# Patient Record
Sex: Male | Born: 1960 | Race: White | Hispanic: No | Marital: Married | Smoking: Never smoker
Health system: Southern US, Community
[De-identification: ages and names within clinical notes are randomized; demographics above are authoritative.]

## PROBLEM LIST (undated history)

## (undated) DIAGNOSIS — G43909 Migraine, unspecified, not intractable, without status migrainosus: Secondary | ICD-10-CM

## (undated) DIAGNOSIS — I10 Essential (primary) hypertension: Secondary | ICD-10-CM

## (undated) DIAGNOSIS — IMO0002 Reserved for concepts with insufficient information to code with codable children: Secondary | ICD-10-CM

## (undated) DIAGNOSIS — T7840XA Allergy, unspecified, initial encounter: Secondary | ICD-10-CM

## (undated) DIAGNOSIS — D649 Anemia, unspecified: Secondary | ICD-10-CM

## (undated) DIAGNOSIS — R002 Palpitations: Secondary | ICD-10-CM

## (undated) DIAGNOSIS — R011 Cardiac murmur, unspecified: Secondary | ICD-10-CM

## (undated) DIAGNOSIS — E785 Hyperlipidemia, unspecified: Secondary | ICD-10-CM

## (undated) DIAGNOSIS — K648 Other hemorrhoids: Secondary | ICD-10-CM

## (undated) DIAGNOSIS — K219 Gastro-esophageal reflux disease without esophagitis: Secondary | ICD-10-CM

## (undated) DIAGNOSIS — M1990 Unspecified osteoarthritis, unspecified site: Secondary | ICD-10-CM

## (undated) DIAGNOSIS — R55 Syncope and collapse: Secondary | ICD-10-CM

## (undated) DIAGNOSIS — F32A Depression, unspecified: Secondary | ICD-10-CM

## (undated) DIAGNOSIS — F410 Panic disorder [episodic paroxysmal anxiety] without agoraphobia: Secondary | ICD-10-CM

## (undated) DIAGNOSIS — K5130 Ulcerative (chronic) rectosigmoiditis without complications: Secondary | ICD-10-CM

## (undated) HISTORY — DX: Essential (primary) hypertension: I10

## (undated) HISTORY — DX: Unspecified osteoarthritis, unspecified site: M19.90

## (undated) HISTORY — DX: Allergy, unspecified, initial encounter: T78.40XA

## (undated) HISTORY — DX: Panic disorder (episodic paroxysmal anxiety): F41.0

## (undated) HISTORY — PX: ADENOIDECTOMY: SUR15

## (undated) HISTORY — DX: Reserved for concepts with insufficient information to code with codable children: IMO0002

## (undated) HISTORY — PX: EYE SURGERY: SHX253

## (undated) HISTORY — DX: Gastro-esophageal reflux disease without esophagitis: K21.9

## (undated) HISTORY — PX: UPPER GASTROINTESTINAL ENDOSCOPY: SHX188

## (undated) HISTORY — DX: Anemia, unspecified: D64.9

## (undated) HISTORY — PX: OTHER SURGICAL HISTORY: SHX169

## (undated) HISTORY — DX: Migraine, unspecified, not intractable, without status migrainosus: G43.909

## (undated) HISTORY — DX: Palpitations: R00.2

## (undated) HISTORY — DX: Depression, unspecified: F32.A

## (undated) HISTORY — DX: Hyperlipidemia, unspecified: E78.5

## (undated) HISTORY — DX: Syncope and collapse: R55

## (undated) HISTORY — DX: Cardiac murmur, unspecified: R01.1

## (undated) HISTORY — DX: Ulcerative (chronic) rectosigmoiditis without complications: K51.30

## (undated) HISTORY — DX: Gilbert syndrome: E80.4

## (undated) HISTORY — DX: Other hemorrhoids: K64.8

## (undated) HISTORY — PX: COLONOSCOPY: SHX174

---

## 1999-10-11 DIAGNOSIS — K5130 Ulcerative (chronic) rectosigmoiditis without complications: Secondary | ICD-10-CM

## 1999-10-11 HISTORY — DX: Ulcerative (chronic) rectosigmoiditis without complications: K51.30

## 1999-11-10 ENCOUNTER — Ambulatory Visit (HOSPITAL_COMMUNITY): Admission: RE | Admit: 1999-11-10 | Discharge: 1999-11-10 | Payer: Self-pay | Admitting: Orthopedic Surgery

## 2000-03-16 ENCOUNTER — Ambulatory Visit (HOSPITAL_COMMUNITY): Admission: RE | Admit: 2000-03-16 | Discharge: 2000-03-16 | Payer: Self-pay | Admitting: Gastroenterology

## 2000-03-16 ENCOUNTER — Encounter (INDEPENDENT_AMBULATORY_CARE_PROVIDER_SITE_OTHER): Payer: Self-pay | Admitting: Specialist

## 2004-08-19 ENCOUNTER — Ambulatory Visit: Payer: Self-pay | Admitting: Internal Medicine

## 2004-08-23 ENCOUNTER — Encounter: Admission: RE | Admit: 2004-08-23 | Discharge: 2004-08-23 | Payer: Self-pay | Admitting: Internal Medicine

## 2004-08-25 ENCOUNTER — Encounter: Admission: RE | Admit: 2004-08-25 | Discharge: 2004-08-25 | Payer: Self-pay | Admitting: Internal Medicine

## 2004-08-31 ENCOUNTER — Encounter: Admission: RE | Admit: 2004-08-31 | Discharge: 2004-08-31 | Payer: Self-pay | Admitting: Internal Medicine

## 2004-09-14 ENCOUNTER — Ambulatory Visit: Payer: Self-pay | Admitting: Gastroenterology

## 2004-10-25 ENCOUNTER — Ambulatory Visit: Payer: Self-pay | Admitting: Internal Medicine

## 2004-12-22 ENCOUNTER — Ambulatory Visit: Payer: Self-pay | Admitting: Internal Medicine

## 2005-03-17 ENCOUNTER — Ambulatory Visit: Payer: Self-pay | Admitting: Internal Medicine

## 2005-04-11 ENCOUNTER — Ambulatory Visit: Payer: Self-pay | Admitting: Internal Medicine

## 2005-08-15 ENCOUNTER — Ambulatory Visit: Payer: Self-pay | Admitting: Internal Medicine

## 2005-08-16 ENCOUNTER — Ambulatory Visit: Payer: Self-pay | Admitting: Gastroenterology

## 2005-08-23 ENCOUNTER — Ambulatory Visit: Payer: Self-pay | Admitting: Internal Medicine

## 2005-10-12 ENCOUNTER — Encounter (INDEPENDENT_AMBULATORY_CARE_PROVIDER_SITE_OTHER): Payer: Self-pay | Admitting: Specialist

## 2005-10-12 ENCOUNTER — Ambulatory Visit: Payer: Self-pay | Admitting: Gastroenterology

## 2005-10-22 ENCOUNTER — Encounter: Payer: Self-pay | Admitting: Gastroenterology

## 2005-11-25 ENCOUNTER — Ambulatory Visit: Payer: Self-pay | Admitting: Internal Medicine

## 2006-03-20 ENCOUNTER — Ambulatory Visit: Payer: Self-pay | Admitting: Internal Medicine

## 2006-07-31 ENCOUNTER — Ambulatory Visit: Payer: Self-pay | Admitting: Internal Medicine

## 2006-07-31 LAB — CONVERTED CEMR LAB
Basophils Absolute: 0 10*3/uL (ref 0.0–0.1)
Basophils Relative: 0.2 % (ref 0.0–1.0)
Chol/HDL Ratio, serum: 4.3
Cholesterol: 191 mg/dL (ref 0–200)
Eosinophil percent: 3.1 % (ref 0.0–5.0)
HCT: 44.6 % (ref 39.0–52.0)
HDL: 44.8 mg/dL (ref >39.0)
Hemoglobin: 15.1 g/dL (ref 13.0–17.0)
LDL Cholesterol: 125 mg/dL — ABNORMAL HIGH (ref 0–99)
Lymphocytes Relative: 21.4 % (ref 12.0–46.0)
MCHC: 33.8 g/dL (ref 30.0–36.0)
MCV: 84.9 fL (ref 78.0–100.0)
Monocytes Absolute: 0.5 10*3/uL (ref 0.2–0.7)
Monocytes Relative: 8.5 % (ref 3.0–11.0)
Neutro Abs: 3.5 10*3/uL (ref 1.4–7.7)
Neutrophils Relative %: 66.8 % (ref 43.0–77.0)
Platelets: 202 10*3/uL (ref 150–400)
RBC: 5.26 M/uL (ref 4.22–5.81)
RDW: 12.3 % (ref 11.5–14.6)
Triglyceride fasting, serum: 107 mg/dL (ref 0–149)
VLDL: 21 mg/dL (ref 0–40)
WBC: 5.3 10*3/uL (ref 4.5–10.5)

## 2007-02-01 ENCOUNTER — Ambulatory Visit: Payer: Self-pay | Admitting: Internal Medicine

## 2007-02-01 LAB — CONVERTED CEMR LAB
ALT: 11 units/L (ref 0–40)
AST: 17 units/L (ref 0–37)
Albumin: 4.3 g/dL (ref 3.5–5.2)
Alkaline Phosphatase: 42 units/L (ref 39–117)
BUN: 13 mg/dL (ref 6–23)
Basophils Absolute: 0 10*3/uL (ref 0.0–0.1)
Basophils Relative: 0.5 % (ref 0.0–1.0)
Bilirubin, Direct: 0.1 mg/dL (ref 0.0–0.3)
CO2: 31 meq/L (ref 19–32)
Calcium: 9.4 mg/dL (ref 8.4–10.5)
Chloride: 106 meq/L (ref 96–112)
Cholesterol: 211 mg/dL (ref 0–200)
Creatinine, Ser: 1.2 mg/dL (ref 0.4–1.5)
Direct LDL: 145.8 mg/dL
Eosinophils Absolute: 0.1 10*3/uL (ref 0.0–0.6)
Eosinophils Relative: 1.9 % (ref 0.0–5.0)
GFR calc Af Amer: 85 mL/min
GFR calc non Af Amer: 70 mL/min
Glucose, Bld: 96 mg/dL (ref 70–99)
HCT: 41.5 % (ref 39.0–52.0)
HDL: 48.2 mg/dL (ref >39.0)
Hemoglobin: 14.7 g/dL (ref 13.0–17.0)
Lymphocytes Relative: 21.8 % (ref 12.0–46.0)
MCHC: 35.4 g/dL (ref 30.0–36.0)
MCV: 84.1 fL (ref 78.0–100.0)
Monocytes Absolute: 0.5 10*3/uL (ref 0.2–0.7)
Monocytes Relative: 9.2 % (ref 3.0–11.0)
Neutro Abs: 3.5 10*3/uL (ref 1.4–7.7)
Neutrophils Relative %: 66.6 % (ref 43.0–77.0)
Platelets: 176 10*3/uL (ref 150–400)
Potassium: 4.1 meq/L (ref 3.5–5.1)
RBC: 4.93 M/uL (ref 4.22–5.81)
RDW: 12.8 % (ref 11.5–14.6)
Sodium: 143 meq/L (ref 135–145)
TSH: 0.86 microintl units/mL (ref 0.35–5.50)
Total Bilirubin: 1.5 mg/dL — ABNORMAL HIGH (ref 0.3–1.2)
Total CHOL/HDL Ratio: 4.4
Total Protein: 6.8 g/dL (ref 6.0–8.3)
Triglycerides: 73 mg/dL (ref 0–149)
VLDL: 15 mg/dL (ref 0–40)
WBC: 5.3 10*3/uL (ref 4.5–10.5)

## 2007-02-12 ENCOUNTER — Ambulatory Visit: Payer: Self-pay | Admitting: Internal Medicine

## 2007-03-27 ENCOUNTER — Ambulatory Visit: Payer: Self-pay | Admitting: Internal Medicine

## 2007-04-04 ENCOUNTER — Ambulatory Visit: Payer: Self-pay | Admitting: Gastroenterology

## 2007-05-03 ENCOUNTER — Ambulatory Visit: Payer: Self-pay | Admitting: Gastroenterology

## 2007-05-03 DIAGNOSIS — J309 Allergic rhinitis, unspecified: Secondary | ICD-10-CM

## 2007-05-03 DIAGNOSIS — G43909 Migraine, unspecified, not intractable, without status migrainosus: Secondary | ICD-10-CM

## 2007-05-15 ENCOUNTER — Ambulatory Visit: Payer: Self-pay | Admitting: Internal Medicine

## 2007-05-15 DIAGNOSIS — E785 Hyperlipidemia, unspecified: Secondary | ICD-10-CM

## 2007-05-15 LAB — CONVERTED CEMR LAB
ALT: 9 units/L (ref 0–53)
AST: 13 units/L (ref 0–37)
Albumin: 4.4 g/dL (ref 3.5–5.2)
Alkaline Phosphatase: 29 units/L — ABNORMAL LOW (ref 39–117)
Bilirubin, Direct: 0.2 mg/dL (ref 0.0–0.3)
Cholesterol: 200 mg/dL (ref 0–200)
HDL: 54.4 mg/dL (ref >39.0)
LDL Cholesterol: 131 mg/dL — ABNORMAL HIGH (ref 0–99)
Total Bilirubin: 1.1 mg/dL (ref 0.3–1.2)
Total CHOL/HDL Ratio: 3.7
Total Protein: 6.7 g/dL (ref 6.0–8.3)
Triglycerides: 73 mg/dL (ref 0–149)
VLDL: 15 mg/dL (ref 0–40)

## 2007-05-22 ENCOUNTER — Ambulatory Visit: Payer: Self-pay | Admitting: Internal Medicine

## 2007-05-22 LAB — CONVERTED CEMR LAB
Basophils Absolute: 0 10*3/uL (ref 0.0–0.1)
Basophils Relative: 0.6 % (ref 0.0–1.0)
CEA: 1.3 ng/mL (ref 0.0–5.0)
CRP, High Sensitivity: <1 — ABNORMAL LOW (ref 0.00–5.00)
Eosinophils Absolute: 0 10*3/uL (ref 0.0–0.6)
Eosinophils Relative: 0.7 % (ref 0.0–5.0)
HCT: 40.2 % (ref 39.0–52.0)
Hemoglobin: 14.3 g/dL (ref 13.0–17.0)
Lymphocytes Relative: 32.1 % (ref 12.0–46.0)
MCHC: 35.6 g/dL (ref 30.0–36.0)
MCV: 84.9 fL (ref 78.0–100.0)
Monocytes Absolute: 0.4 10*3/uL (ref 0.2–0.7)
Monocytes Relative: 11.1 % — ABNORMAL HIGH (ref 3.0–11.0)
Neutro Abs: 2.2 10*3/uL (ref 1.4–7.7)
Neutrophils Relative %: 55.5 % (ref 43.0–77.0)
Platelets: 173 10*3/uL (ref 150–400)
RBC: 4.74 M/uL (ref 4.22–5.81)
RDW: 13.2 % (ref 11.5–14.6)
TSH: 1.18 microintl units/mL (ref 0.35–5.50)
WBC: 3.9 10*3/uL — ABNORMAL LOW (ref 4.5–10.5)

## 2007-05-23 ENCOUNTER — Ambulatory Visit: Payer: Self-pay | Admitting: Family Medicine

## 2007-05-23 ENCOUNTER — Encounter: Payer: Self-pay | Admitting: Internal Medicine

## 2007-05-25 ENCOUNTER — Telehealth: Payer: Self-pay | Admitting: Internal Medicine

## 2007-08-15 ENCOUNTER — Ambulatory Visit: Payer: Self-pay | Admitting: Internal Medicine

## 2007-08-15 LAB — CONVERTED CEMR LAB
Cholesterol: 199 mg/dL (ref 0–200)
HDL: 54.5 mg/dL (ref >39.0)
LDL Cholesterol: 126 mg/dL — ABNORMAL HIGH (ref 0–99)
Total CHOL/HDL Ratio: 3.7
Triglycerides: 91 mg/dL (ref 0–149)
VLDL: 18 mg/dL (ref 0–40)

## 2007-08-22 ENCOUNTER — Ambulatory Visit: Payer: Self-pay | Admitting: Internal Medicine

## 2007-08-22 LAB — CONVERTED CEMR LAB
Cholesterol, target level: 200 mg/dL
HDL goal, serum: 40 mg/dL
LDL Goal: 160 mg/dL

## 2007-11-28 ENCOUNTER — Ambulatory Visit: Payer: Self-pay | Admitting: Internal Medicine

## 2008-01-23 DIAGNOSIS — K648 Other hemorrhoids: Secondary | ICD-10-CM | POA: Insufficient documentation

## 2008-01-23 DIAGNOSIS — K5130 Ulcerative (chronic) rectosigmoiditis without complications: Secondary | ICD-10-CM | POA: Insufficient documentation

## 2008-01-31 ENCOUNTER — Ambulatory Visit: Payer: Self-pay | Admitting: Internal Medicine

## 2008-01-31 DIAGNOSIS — K219 Gastro-esophageal reflux disease without esophagitis: Secondary | ICD-10-CM

## 2008-04-02 ENCOUNTER — Ambulatory Visit: Payer: Self-pay | Admitting: Internal Medicine

## 2008-04-03 ENCOUNTER — Telehealth: Payer: Self-pay | Admitting: Internal Medicine

## 2008-04-03 LAB — CONVERTED CEMR LAB
Basophils Absolute: 0 10*3/uL (ref 0.0–0.1)
Basophils Relative: 0.4 % (ref 0.0–1.0)
CRP, High Sensitivity: <1 — ABNORMAL LOW (ref 0.00–5.00)
Eosinophils Absolute: 0 10*3/uL (ref 0.0–0.7)
Eosinophils Relative: 0.7 % (ref 0.0–5.0)
HCT: 41.5 % (ref 39.0–52.0)
Hemoglobin: 14.8 g/dL (ref 13.0–17.0)
Lymphocytes Relative: 30 % (ref 12.0–46.0)
MCHC: 35.6 g/dL (ref 30.0–36.0)
MCV: 85 fL (ref 78.0–100.0)
Monocytes Absolute: 0.5 10*3/uL (ref 0.1–1.0)
Monocytes Relative: 11.2 % (ref 3.0–12.0)
Neutro Abs: 2.5 10*3/uL (ref 1.4–7.7)
Neutrophils Relative %: 57.7 % (ref 43.0–77.0)
Platelets: 188 10*3/uL (ref 150–400)
RBC: 4.88 M/uL (ref 4.22–5.81)
RDW: 12.1 % (ref 11.5–14.6)
WBC: 4.3 10*3/uL — ABNORMAL LOW (ref 4.5–10.5)

## 2008-04-09 LAB — CONVERTED CEMR LAB: Vit D, 1,25-Dihydroxy: 23 — ABNORMAL LOW (ref 30–89)

## 2008-04-18 ENCOUNTER — Telehealth: Payer: Self-pay | Admitting: Gastroenterology

## 2008-05-06 ENCOUNTER — Ambulatory Visit: Payer: Self-pay | Admitting: Gastroenterology

## 2008-05-06 DIAGNOSIS — K5150 Left sided colitis without complications: Secondary | ICD-10-CM

## 2008-06-27 ENCOUNTER — Ambulatory Visit: Payer: Self-pay | Admitting: Gastroenterology

## 2008-08-13 ENCOUNTER — Ambulatory Visit: Payer: Self-pay | Admitting: Internal Medicine

## 2008-08-13 LAB — CONVERTED CEMR LAB
ALT: 11 units/L (ref 0–53)
AST: 15 units/L (ref 0–37)
Albumin: 4.5 g/dL (ref 3.5–5.2)
Alkaline Phosphatase: 35 units/L — ABNORMAL LOW (ref 39–117)
BUN: 12 mg/dL (ref 6–23)
Basophils Absolute: 0 10*3/uL (ref 0.0–0.1)
Basophils Relative: 0.8 % (ref 0.0–3.0)
Bilirubin, Direct: 0.1 mg/dL (ref 0.0–0.3)
CO2: 33 meq/L — ABNORMAL HIGH (ref 19–32)
Calcium: 9.5 mg/dL (ref 8.4–10.5)
Chloride: 106 meq/L (ref 96–112)
Cholesterol: 200 mg/dL (ref 0–200)
Creatinine, Ser: 1.2 mg/dL (ref 0.4–1.5)
Eosinophils Absolute: 0 10*3/uL (ref 0.0–0.7)
Eosinophils Relative: 1.2 % (ref 0.0–5.0)
GFR calc Af Amer: 84 mL/min
GFR calc non Af Amer: 70 mL/min
Glucose, Bld: 92 mg/dL (ref 70–99)
HCT: 43.3 % (ref 39.0–52.0)
HDL: 55 mg/dL (ref >39.0)
Hemoglobin: 15 g/dL (ref 13.0–17.0)
LDL Cholesterol: 132 mg/dL — ABNORMAL HIGH (ref 0–99)
Lymphocytes Relative: 28.6 % (ref 12.0–46.0)
MCHC: 34.7 g/dL (ref 30.0–36.0)
MCV: 88.8 fL (ref 78.0–100.0)
Monocytes Absolute: 0.4 10*3/uL (ref 0.1–1.0)
Monocytes Relative: 9.4 % (ref 3.0–12.0)
Neutro Abs: 2.4 10*3/uL (ref 1.4–7.7)
Neutrophils Relative %: 60 % (ref 43.0–77.0)
Platelets: 161 10*3/uL (ref 150–400)
Potassium: 4.6 meq/L (ref 3.5–5.1)
RBC: 4.88 M/uL (ref 4.22–5.81)
RDW: 12.5 % (ref 11.5–14.6)
Sodium: 144 meq/L (ref 135–145)
Total Bilirubin: 1.3 mg/dL — ABNORMAL HIGH (ref 0.3–1.2)
Total CHOL/HDL Ratio: 3.6
Total Protein: 7.1 g/dL (ref 6.0–8.3)
Triglycerides: 66 mg/dL (ref 0–149)
VLDL: 13 mg/dL (ref 0–40)
WBC: 3.9 10*3/uL — ABNORMAL LOW (ref 4.5–10.5)

## 2008-08-22 ENCOUNTER — Ambulatory Visit: Payer: Self-pay | Admitting: Internal Medicine

## 2008-08-27 ENCOUNTER — Telehealth: Payer: Self-pay | Admitting: *Deleted

## 2008-08-27 LAB — CONVERTED CEMR LAB: Vit D, 1,25-Dihydroxy: 17 — ABNORMAL LOW (ref 30–89)

## 2008-09-12 ENCOUNTER — Telehealth: Payer: Self-pay | Admitting: Gastroenterology

## 2008-09-12 ENCOUNTER — Telehealth: Payer: Self-pay | Admitting: Internal Medicine

## 2009-04-11 ENCOUNTER — Encounter: Payer: Self-pay | Admitting: Internal Medicine

## 2009-04-24 ENCOUNTER — Ambulatory Visit: Payer: Self-pay | Admitting: Internal Medicine

## 2009-04-24 LAB — CONVERTED CEMR LAB
ALT: 11 units/L (ref 0–53)
AST: 15 units/L (ref 0–37)
Albumin: 4.5 g/dL (ref 3.5–5.2)
Alkaline Phosphatase: 40 units/L (ref 39–117)
BUN: 15 mg/dL (ref 6–23)
Basophils Absolute: 0 10*3/uL (ref 0.0–0.1)
Basophils Relative: 0.8 % (ref 0.0–3.0)
Bilirubin, Direct: 0.2 mg/dL (ref 0.0–0.3)
CO2: 31 meq/L (ref 19–32)
Calcium: 9 mg/dL (ref 8.4–10.5)
Chloride: 107 meq/L (ref 96–112)
Cholesterol: 166 mg/dL (ref 0–200)
Creatinine, Ser: 1.2 mg/dL (ref 0.4–1.5)
Eosinophils Absolute: 0.1 10*3/uL (ref 0.0–0.7)
Eosinophils Relative: 1.5 % (ref 0.0–5.0)
GFR calc non Af Amer: 69.12 mL/min (ref >60)
Glucose, Bld: 96 mg/dL (ref 70–99)
HCT: 41.5 % (ref 39.0–52.0)
HDL: 53.5 mg/dL (ref >39.00)
Hemoglobin: 14.8 g/dL (ref 13.0–17.0)
LDL Cholesterol: 102 mg/dL — ABNORMAL HIGH (ref 0–99)
Lymphocytes Relative: 26.3 % (ref 12.0–46.0)
Lymphs Abs: 1 10*3/uL (ref 0.7–4.0)
MCHC: 35.6 g/dL (ref 30.0–36.0)
MCV: 84.3 fL (ref 78.0–100.0)
Monocytes Absolute: 0.4 10*3/uL (ref 0.1–1.0)
Monocytes Relative: 10.4 % (ref 3.0–12.0)
Neutro Abs: 2.4 10*3/uL (ref 1.4–7.7)
Neutrophils Relative %: 61 % (ref 43.0–77.0)
Platelets: 149 10*3/uL — ABNORMAL LOW (ref 150.0–400.0)
Potassium: 4.3 meq/L (ref 3.5–5.1)
RBC: 4.92 M/uL (ref 4.22–5.81)
RDW: 12.5 % (ref 11.5–14.6)
Sodium: 141 meq/L (ref 135–145)
TSH: 0.61 microintl units/mL (ref 0.35–5.50)
Total Bilirubin: 1.6 mg/dL — ABNORMAL HIGH (ref 0.3–1.2)
Total CHOL/HDL Ratio: 3
Total Protein: 7 g/dL (ref 6.0–8.3)
Triglycerides: 55 mg/dL (ref 0.0–149.0)
VLDL: 11 mg/dL (ref 0.0–40.0)
WBC: 3.9 10*3/uL — ABNORMAL LOW (ref 4.5–10.5)

## 2009-05-01 ENCOUNTER — Ambulatory Visit: Payer: Self-pay | Admitting: Internal Medicine

## 2009-05-07 ENCOUNTER — Encounter: Payer: Self-pay | Admitting: Internal Medicine

## 2009-05-07 ENCOUNTER — Ambulatory Visit: Payer: Self-pay

## 2009-05-18 LAB — CONVERTED CEMR LAB: IgE (Immunoglobulin E), Serum: 26.9 intl units/mL (ref 0.0–180.0)

## 2009-05-27 ENCOUNTER — Ambulatory Visit: Payer: Self-pay | Admitting: Internal Medicine

## 2009-05-29 ENCOUNTER — Ambulatory Visit: Payer: Self-pay | Admitting: Internal Medicine

## 2009-07-20 ENCOUNTER — Ambulatory Visit: Payer: Self-pay | Admitting: Internal Medicine

## 2009-07-20 ENCOUNTER — Encounter: Payer: Self-pay | Admitting: Internal Medicine

## 2009-07-31 ENCOUNTER — Ambulatory Visit: Payer: Self-pay | Admitting: Internal Medicine

## 2009-08-04 ENCOUNTER — Encounter: Payer: Self-pay | Admitting: Internal Medicine

## 2009-08-04 ENCOUNTER — Telehealth (INDEPENDENT_AMBULATORY_CARE_PROVIDER_SITE_OTHER): Payer: Self-pay | Admitting: *Deleted

## 2009-10-22 ENCOUNTER — Ambulatory Visit: Payer: Self-pay | Admitting: Internal Medicine

## 2009-10-22 LAB — CONVERTED CEMR LAB: Vit D, 25-Hydroxy: 36 ng/mL (ref 30–89)

## 2009-10-27 ENCOUNTER — Ambulatory Visit: Payer: Self-pay | Admitting: Internal Medicine

## 2009-10-27 DIAGNOSIS — E559 Vitamin D deficiency, unspecified: Secondary | ICD-10-CM

## 2010-01-21 ENCOUNTER — Telehealth: Payer: Self-pay | Admitting: Internal Medicine

## 2010-02-02 ENCOUNTER — Ambulatory Visit: Payer: Self-pay | Admitting: Internal Medicine

## 2010-02-02 DIAGNOSIS — M542 Cervicalgia: Secondary | ICD-10-CM

## 2010-02-23 ENCOUNTER — Encounter (INDEPENDENT_AMBULATORY_CARE_PROVIDER_SITE_OTHER): Payer: Self-pay | Admitting: *Deleted

## 2010-04-26 ENCOUNTER — Ambulatory Visit: Payer: Self-pay | Admitting: Internal Medicine

## 2010-04-26 LAB — CONVERTED CEMR LAB
ALT: 9 units/L (ref 0–53)
AST: 13 units/L (ref 0–37)
Albumin: 4.9 g/dL (ref 3.5–5.2)
Alkaline Phosphatase: 37 units/L — ABNORMAL LOW (ref 39–117)
BUN: 17 mg/dL (ref 6–23)
Basophils Absolute: 0 10*3/uL (ref 0.0–0.1)
Basophils Relative: 0.5 % (ref 0.0–3.0)
Bilirubin Urine: NEGATIVE
Bilirubin, Direct: 0.2 mg/dL (ref 0.0–0.3)
CO2: 33 meq/L — ABNORMAL HIGH (ref 19–32)
Calcium: 9.1 mg/dL (ref 8.4–10.5)
Chloride: 106 meq/L (ref 96–112)
Cholesterol: 213 mg/dL — ABNORMAL HIGH (ref 0–200)
Creatinine, Ser: 1.1 mg/dL (ref 0.4–1.5)
Direct LDL: 136.2 mg/dL
Eosinophils Absolute: 0.1 10*3/uL (ref 0.0–0.7)
Eosinophils Relative: 1.4 % (ref 0.0–5.0)
GFR calc non Af Amer: 73.77 mL/min (ref >60)
Glucose, Bld: 87 mg/dL (ref 70–99)
Glucose, Urine, Semiquant: NEGATIVE
HCT: 43.3 % (ref 39.0–52.0)
HDL: 64.9 mg/dL (ref >39.00)
Hemoglobin: 15.1 g/dL (ref 13.0–17.0)
Ketones, urine, test strip: NEGATIVE
Lymphocytes Relative: 30.7 % (ref 12.0–46.0)
Lymphs Abs: 1.4 10*3/uL (ref 0.7–4.0)
MCHC: 34.9 g/dL (ref 30.0–36.0)
MCV: 86.8 fL (ref 78.0–100.0)
Monocytes Absolute: 0.4 10*3/uL (ref 0.1–1.0)
Monocytes Relative: 9.4 % (ref 3.0–12.0)
Neutro Abs: 2.7 10*3/uL (ref 1.4–7.7)
Neutrophils Relative %: 58 % (ref 43.0–77.0)
Nitrite: NEGATIVE
Platelets: 174 10*3/uL (ref 150.0–400.0)
Potassium: 4 meq/L (ref 3.5–5.1)
Protein, U semiquant: NEGATIVE
RBC: 4.99 M/uL (ref 4.22–5.81)
RDW: 13 % (ref 11.5–14.6)
Sodium: 141 meq/L (ref 135–145)
Specific Gravity, Urine: 1.025
TSH: 0.73 microintl units/mL (ref 0.35–5.50)
Total Bilirubin: 1.5 mg/dL — ABNORMAL HIGH (ref 0.3–1.2)
Total CHOL/HDL Ratio: 3
Total Protein: 7.3 g/dL (ref 6.0–8.3)
Triglycerides: 93 mg/dL (ref 0.0–149.0)
Urobilinogen, UA: 0.2
VLDL: 18.6 mg/dL (ref 0.0–40.0)
WBC Urine, dipstick: NEGATIVE
WBC: 4.6 10*3/uL (ref 4.5–10.5)
pH: 6

## 2010-05-03 ENCOUNTER — Ambulatory Visit: Payer: Self-pay | Admitting: Internal Medicine

## 2010-05-03 DIAGNOSIS — L408 Other psoriasis: Secondary | ICD-10-CM

## 2010-06-16 ENCOUNTER — Ambulatory Visit: Payer: Self-pay | Admitting: Internal Medicine

## 2010-06-16 DIAGNOSIS — R071 Chest pain on breathing: Secondary | ICD-10-CM

## 2010-07-05 ENCOUNTER — Ambulatory Visit: Payer: Self-pay | Admitting: Internal Medicine

## 2010-10-12 ENCOUNTER — Encounter (INDEPENDENT_AMBULATORY_CARE_PROVIDER_SITE_OTHER): Payer: Self-pay | Admitting: *Deleted

## 2010-10-12 ENCOUNTER — Ambulatory Visit
Admission: RE | Admit: 2010-10-12 | Discharge: 2010-10-12 | Payer: Self-pay | Source: Home / Self Care | Attending: Internal Medicine | Admitting: Internal Medicine

## 2010-10-12 DIAGNOSIS — M1710 Unilateral primary osteoarthritis, unspecified knee: Secondary | ICD-10-CM | POA: Insufficient documentation

## 2010-11-11 ENCOUNTER — Encounter (INDEPENDENT_AMBULATORY_CARE_PROVIDER_SITE_OTHER): Payer: Self-pay | Admitting: *Deleted

## 2010-11-11 NOTE — Assessment & Plan Note (Signed)
Summary: requesting xray--pt fell//ccm   Vital Signs:  Patient profile:   50 year old male Weight:      168 pounds Temp:     98.5 degrees F oral BP sitting:   110 / 76  (right arm) Cuff size:   regular  Vitals Entered By: Cay Schillings LPN (June 16, 6788 9:02 AM) CC: fell from a coffee table monday - hit mid chest - no c/o SOB , c/o chest soreness Is Patient Diabetic? No   Primary Care Amaryllis Malmquist:  Benay Pillow, MD   CC:  fell from a coffee table monday - hit mid chest - no c/o SOB  and c/o chest soreness.  History of Present Illness: 50 year old patient who fell off a coffee table traumatizing his left anterolateral chest wall region.  There has been some local tenderness, but fairly modest as well as mild discomfort with deep inspiration.  There has been no shortness of breath. He has noted  No bruising.  He has been using anti-inflammatory medications with some benefit Flu Vaccine Consent Questions     Do you have a history of severe allergic reactions to this vaccine? no    Any prior history of allergic reactions to egg and/or gelatin? no    Do you have a sensitivity to the preservative Thimersol? no    Do you have a past history of Guillan-Barre Syndrome? no    Do you currently have an acute febrile illness? no    Have you ever had a severe reaction to latex? no    Vaccine information given and explained to patient? yes    Are you currently pregnant? no    Lot Number:[id number]   Exp Date:04/09/2011   Site Given  Left Deltoid IM  Allergies (verified): No Known Drug Allergies  Past History:  Past Medical History: Reviewed history from 05/06/2008 and no changes required. Allergic rhinitis Headache GERD Ulcerative proctosigmoiditis Internal hemorrhoids  Review of Systems       The patient complains of chest pain.  The patient denies anorexia, fever, weight loss, weight gain, vision loss, decreased hearing, hoarseness, syncope, dyspnea on exertion, peripheral  edema, prolonged cough, headaches, hemoptysis, abdominal pain, melena, hematochezia, severe indigestion/heartburn, hematuria, incontinence, genital sores, muscle weakness, suspicious skin lesions, transient blindness, difficulty walking, depression, unusual weight change, abnormal bleeding, enlarged lymph nodes, angioedema, breast masses, and testicular masses.    Physical Exam  General:  Well-developed,well-nourished,in no acute distress; alert,appropriate and cooperative throughout examination; blood pressure 120/80 Chest Wall:  mild tenderness over the right anterior chest wall region.  No ecchymosis Lungs:  Normal respiratory effort, chest expands symmetrically. Lungs are clear to auscultation, no crackles or wheezes. Heart:  no tachycardia   Impression & Recommendations:  Problem # 1:  CHEST WALL PAIN, ACUTE (ICD-786.52) will continue treatment with anti-inflammatory medications and observe  Complete Medication List: 1)  Colazal 750 Mg Caps (Balsalazide disodium) .... Take 3 tablets by mouth two times a day 2)  Flonase 50 Mcg/act Susp (Fluticasone propionate) .... Oen spray each nostril twice a day 3)  Imitrex 100 Mg Tabs (Sumatriptan succinate) .... Take as needed for migraines 4)  Doxepin Hcl 25 Mg Caps (Doxepin hcl) .... One by mouth  at bed time 5)  Loratadine 10 Mg Tabs (Loratadine) .... Take 1 tablet by mouth once a day 6)  Vitamin D 50000 Unit Caps (Ergocalciferol) .Marland Kitchen.. 1 every week 7)  Canasa 1000 Mg Supp (Mesalamine) .... One suppository every3rd  night 8)  Omega-3 Javier Docker  Oil 300 Mg Caps (Krill oil) .... One by mouth tid 9)  Diphenhydramine Hcl 25 Mg Tabs (Diphenhydramine hcl) .Marland Kitchen.. 1 at bedtime 10)  Endocet 5-325 Mg Tabs (Oxycodone-acetaminophen) .... One by mouth q 6 hours as needed head ache 11)  Clobetasol Propionate 0.05 % Foam (Clobetasol propionate) .... Apply to scalp daily as need 12)  Prilosec Otc 20 Mg Tbec (Omeprazole magnesium) .... Qd  Other Orders: Admin 1st  Vaccine (23361) Flu Vaccine 20yr + ((22449  Patient Instructions: 1)  Take 400-6064mof Ibuprofen (Advil, Motrin) with food every 4-6 hours as needed for relief of pain or comfort of fever. 2)  Please schedule a follow-up appointment as needed.

## 2010-11-11 NOTE — Assessment & Plan Note (Signed)
Summary: 3 month rov/njr [facility] BMP/NJR   Vital Signs:  Patient profile:   50 year old male Height:      70 inches Weight:      167 pounds BMI:     24.05 Temp:     98.3 degrees F oral Pulse rate:   76 / minute Resp:     14 per minute BP sitting:   110 / 80  (left arm)  Vitals Entered By: Allyne Gee, LPN (October 27, 6382 9:00 AM) CC: roa labs   Primary Care Provider:  Benay Pillow, MD   CC:  roa labs.  History of Present Illness: Low grade flair of colitis with some hemorrhoids flair the proctocort worked better that the  rowasa the vitamin d levels are stil low continue the vitamin D discussion the walking  Preventive Screening-Counseling & Management  Alcohol-Tobacco     Smoking Status: never  Problems Prior to Update: 1)  Allergic Rhinitis, Due To Food  (ICD-477.1) 2)  Hx of Acute Frontal Sinusitis  (ICD-461.1) 3)  Preventive Health Care  (ICD-V70.0) 4)  Ulcerative Colitis-left Side  (ICD-556.5) 5)  Gerd  (ICD-530.81) 6)  Ulcerative Colitis  (ICD-556.9) 7)  Internal Hemorrhoids  (ICD-455.0) 8)  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 Take 3 Tablets By Mouth Two Times A Day 2)  Flonase 50 Mcg/act Susp (Fluticasone Propionate) .... Oen Spray Each Nostril Twice A Day 3)  Imitrex 100 Mg Tabs (Sumatriptan Succinate) .... Take As Needed For Migraines 4)  Doxepin Hcl 50 Mg  Caps (Doxepin Hcl) .... Take 1 Tab By Mouth At Bedtime 5)  Loratadine 10 Mg  Tabs (Loratadine) .... Take 1 Tablet By Mouth Once A Day 6)  Darvocet-N 100 100-650 Mg  Tabs (Propoxyphene N-Apap) .Marland Kitchen.. 1 Every 4-6  Hours As Needed Headache 7)  Vitamin D 50000 Unit  Caps (Ergocalciferol) .Marland Kitchen.. 1 Every Week 8)  Canasa 1000 Mg  Supp (Mesalamine) .... One Suppository Every 3 Nights 9)  Krill Oil 1000 Mg Caps (Krill Oil) .Marland Kitchen.. 1 Once Daily 10)  Diphenhydramine Hcl 25 Mg Tabs (Diphenhydramine Hcl) .Marland Kitchen.. 1 At Bedtime  Current Medications (verified): 1)  Colazal 750 Mg Caps (Balsalazide Disodium) .... Take 3 Tablets By Mouth Two Times A Day 2)  Flonase 50 Mcg/act Susp (Fluticasone Propionate) .... Oen Spray Each Nostril Twice A Day 3)  Imitrex 100 Mg Tabs (Sumatriptan Succinate) .... Take As Needed For Migraines 4)  Doxepin Hcl 50 Mg  Caps (Doxepin Hcl) .... Take 1 Tab By Mouth At Bedtime 5)  Loratadine 10 Mg  Tabs (Loratadine) .... Take 1 Tablet By Mouth Once A Day 6)  Vitamin D 50000 Unit  Caps (Ergocalciferol) .Marland Kitchen.. 1 Every Week 7)  Canasa 1000 Mg  Supp (Mesalamine) .... One Suppository Every 3 Nights 8)  Krill 300 Mg .Marland Kitchen.. 1 Once Daily 9)  Diphenhydramine Hcl 25 Mg Tabs (Diphenhydramine Hcl) .Marland Kitchen.. 1 At Bedtime 10)  Hydrocortisone Acetate 30 Mg Supp (Hydrocortisone Acetate) .... Apply Per Rectum Daily As Directed 11)  Endocet 5-325 Mg Tabs (Oxycodone-Acetaminophen) .... One By Mouth Q 6 Hours As Needed Head Ache  Allergies (verified): No Known Drug  Allergies  Past History:  Family History: Last updated: 05/29/2009 father with hypothyroidism aortic acrch dilation Borther probable Marfan's disease with aortic root repair AF mother  Family History of Arthritis wth hip replacement. Family History of Colon Cancer: Grandmothers Family Family History of Colitis/Crohn's: Aunt (crohns), cousine (crohns) Family History of Colon Polyps: Grandmother, Great Aunts, Great Uncles Family History of Heart Disease: Brother, Father  Social History: Last updated: 05/06/2008 Domestic Partner Never Smoked Alcohol use-yes-1 glass nightly Occupation:  Probation officer Daily Caffeine Use Illicit Drug Use - no Patient  gets regular exercise.  Risk Factors: Exercise: yes (05/06/2008)  Risk Factors: Smoking Status: never (10/27/2009)  Past medical, surgical, family and social histories (including risk factors) reviewed, and no changes noted (except as noted below).  Past Medical History: Reviewed history from 05/06/2008 and no changes required. Allergic rhinitis Headache GERD Ulcerative proctosigmoiditis Internal hemorrhoids  Past Surgical History: Reviewed history from 05/06/2008 and no changes required. Eye Muscle transplant- Bilateral  Family History: Reviewed history from 05/29/2009 and no changes required. father with hypothyroidism aortic acrch dilation Borther probable Marfan's disease with aortic root repair AF mother  Family History of Arthritis wth hip replacement. Family History of Colon Cancer: Grandmothers Family Family History of Colitis/Crohn's: Aunt (crohns), cousine (crohns) Family History of Colon Polyps: Grandmother, Great Aunts, Great Uncles Family History of Heart Disease: Brother, Father  Social History: Reviewed history from 05/06/2008 and no changes required. Domestic Partner Never Smoked Alcohol use-yes-1 glass nightly Occupation:  Probation officer Daily Caffeine Use Illicit Drug Use - no Patient gets regular exercise.  Review of Systems  The patient denies anorexia, fever, weight loss, weight gain, vision loss, decreased hearing, hoarseness, chest pain, syncope, dyspnea on exertion, peripheral edema, prolonged cough, headaches, hemoptysis, abdominal pain, melena, hematochezia, severe indigestion/heartburn, hematuria, incontinence, genital sores, muscle weakness, suspicious skin lesions, transient blindness, difficulty walking, depression, unusual weight change, abnormal bleeding, enlarged lymph nodes, angioedema, and breast masses.    Physical Exam  General:  Well-developed,well-nourished,in no acute distress; alert,appropriate and cooperative throughout  examination Head:  Normocephalic and atraumatic. Ears:  R ear normal and L ear normal.   Nose:  no external deformity and no nasal discharge.   Neck:  No deformities, masses, or tenderness noted. Lungs:  Clear throughout to auscultation. Heart:  normal rate, regular rhythm, and Grade   1/6 systolic ejection murmur.   Abdomen:  Soft, nontender and nondistended. No masses, hepatosplenomegaly or hernias noted. Normal bowel sounds. Msk:  No deformity or scoliosis noted of thoracic or lumbar spine.   Pulses:  Normal pulses noted. Neurologic:  No cranial nerve deficits noted. Station and gait are normal. Plantar reflexes are down-going bilaterally. DTRs are symmetrical throughout. Sensory, motor and coordinative functions appear intact.   Impression & Recommendations:  Problem # 1:  VITAMIN D DEFICIENCY (ICD-268.9) continue the vit d at the level now  Problem # 2:  HEADACHE (ICD-784.0) due to the eliminsation of darvocet The following medications were removed from the medication list:    Darvocet-n 100 100-650 Mg Tabs (Propoxyphene n-apap) .Marland Kitchen... 1 every 4-6 hours as needed headache His updated medication list for this problem includes:    Imitrex 100 Mg Tabs (Sumatriptan succinate) .Marland Kitchen... Take as needed for migraines    Endocet 5-325 Mg Tabs (Oxycodone-acetaminophen) ..... One by mouth q 6 hours as needed head ache  Problem # 3:  HYPERLIPIDEMIA (ICD-272.4) give justifiation for the ish oil Labs Reviewed: SGOT: 15 (04/24/2009)   SGPT: 11 (04/24/2009)  Lipid Goals: Chol Goal:  200 (08/22/2007)   HDL Goal: 40 (08/22/2007)   LDL Goal: 160 (08/22/2007)   TG Goal: 150 (08/22/2007)  Prior 10 Yr Risk Heart Disease: 4 % (08/22/2007)   HDL:53.50 (04/24/2009), 55.0 (08/13/2008)  LDL:102 (04/24/2009), 132 (08/13/2008)  Chol:166 (04/24/2009), 200 (08/13/2008)  Trig:55.0 (04/24/2009), 66 (08/13/2008)  Problem # 4:  ULCERATIVE COLITIS-LEFT SIDE (ICD-556.5) the pt has a mild flair an we have rxed  proctocort at this time  Complete Medication List: 1)  Colazal 750 Mg Caps (Balsalazide disodium) .... Take 3 tablets by mouth two times a day 2)  Flonase 50 Mcg/act Susp (Fluticasone propionate) .... Oen spray each nostril twice a day 3)  Imitrex 100 Mg Tabs (Sumatriptan succinate) .... Take as needed for migraines 4)  Doxepin Hcl 50 Mg Caps (Doxepin hcl) .... Take 1 tab by mouth at bedtime 5)  Loratadine 10 Mg Tabs (Loratadine) .... Take 1 tablet by mouth once a day 6)  Vitamin D 50000 Unit Caps (Ergocalciferol) .Marland Kitchen.. 1 every week 7)  Canasa 1000 Mg Supp (Mesalamine) .... One suppository every 3 nights 8)  Krill 300 Mg  .Marland Kitchen.. 1 once daily 9)  Diphenhydramine Hcl 25 Mg Tabs (Diphenhydramine hcl) .Marland Kitchen.. 1 at bedtime 10)  Hydrocortisone Acetate 30 Mg Supp (Hydrocortisone acetate) .... Apply per rectum daily as directed 11)  Endocet 5-325 Mg Tabs (Oxycodone-acetaminophen) .... One by mouth q 6 hours as needed head ache  Patient Instructions: 1)  July  CPX 2)  call for trip meds for may trip Prescriptions: ENDOCET 5-325 MG TABS (OXYCODONE-ACETAMINOPHEN) one by mouth q 6 hours as needed head ache  #60 x 0   Entered and Authorized by:   Ricard Dillon MD   Signed by:   Ricard Dillon MD on 10/27/2009   Method used:   Print then Give to Patient   RxID:   [id number] VITAMIN D 37858 UNIT  CAPS (ERGOCALCIFEROL) 1 every week  #30 x 3   Entered and Authorized by:   Ricard Dillon MD   Signed by:   Ricard Dillon MD on 10/27/2009   Method used:   Electronically to        [facility] (retail)       [address]       [place], [place]  [address]       Ph: [phone]       Fax: [phone]   [place]:   [phone] HYDROCORTISONE ACETATE 30 MG SUPP (HYDROCORTISONE ACETATE) apply per rectum daily as directed  #30 x 6   Entered and Authorized by:   Ricard Dillon MD   Signed by:   Ricard Dillon MD on 10/27/2009   Method used:   Electronically to         [facility] (retail)       [address]       [place],   [address]       Ph: [phone]       Fax: [phone]   [place]:   [phone]

## 2010-11-11 NOTE — Progress Notes (Signed)
Summary: traveling meds  Phone Note Call from Patient Call back at [facility] Phone [phone]   Summary of Call: Need an 18 day supply of malarone, cipro, phenergan generic.  [facility].   See another message Dwaine Deter. Initial call taken by: Shelbie Hutching, RN,  January 21, 2010 10:01 AM  Follow-up for Phone Call        he and his partner eugene rogers may have 1 -cipro 500 two times a day for 10 days 2-phenergan by mouth 25 mg #12 1 every 6-8 hours as needed nausea and vomiting 3- malarone 250-100 1 by mouth daily  with food or milk 2 days before entering malaria-endemic area and continue while there and 7 days after leaving.  per dr Arnoldo Morale Follow-up by: Allyne Gee, LPN,  January 21, 354 1:21 PM    New/Updated Medications: CIPRO 500 MG TABS (CIPROFLOXACIN HCL) one by mouth two times a day x 10 days PROMETHAZINE HCL 25 MG TABS (PROMETHAZINE HCL) one by mouth q 6-8 hours as needed nausea and vomiting MALARONE 250-100 MG TABS (ATOVAQUONE-PROGUANIL HCL) one by mouth daily with milk or food 2 before entering areas of malaria, and continue 7 days while there Prescriptions: MALARONE 250-100 MG TABS (ATOVAQUONE-PROGUANIL HCL) one by mouth daily with milk or food 2 before entering areas of malaria, and continue 7 days while there  #9 x 0   Entered by:   Deanna Artis CMA   Authorized by:   Ricard Dillon MD   Signed by:   Deanna Artis CMA on 01/21/2010   Method used:   Electronically to        [facility] (retail)       [address]       [place], [place]  [address]       Ph: [phone]       Fax: [phone]   RxID:   [id number] PROMETHAZINE HCL 25 MG TABS (PROMETHAZINE HCL) one by mouth q 6-8 hours as needed nausea and vomiting  #12 x 0   Entered by:   Deanna Artis CMA   Authorized by:   Ricard Dillon MD   Signed by:   Deanna Artis CMA on 01/21/2010   Method used:   Electronically to        [facility]  (retail)       [address]       [place], [place]  [address]       Ph: [phone]       Fax: [phone]   RxID:   [id number] CIPRO 500 MG TABS (CIPROFLOXACIN HCL) one by mouth two times a day x 10 days  #20 x 0   Entered by:   Deanna Artis CMA   Authorized by:   Ricard Dillon MD   Signed by:   Deanna Artis CMA on 01/21/2010   Method used:   Electronically to        [facility] (retail)       [address]       [place],   [address]       Ph: [phone]       Fax: [phone]   [place]:   [phone]

## 2010-11-11 NOTE — Assessment & Plan Note (Signed)
Summary: neck pain//ccm   Vital Signs:  Patient profile:   50 year old male Weight:      169 pounds O2 Sat:      97 % Temp:     98.8 degrees F oral Resp:     12 per minute BP sitting:   106 / 68  Vitals Entered By: Deanna Artis CMA (February 02, 2010 9:20 AM) CC: neck pain Is Patient Diabetic? No Pain Assessment Patient in pain? yes     Location: neck   Primary Care Provider:  Benay Pillow, MD   CC:  neck pain.  History of Present Illness: 50 year old gentleman with a 7-day history of posterior neck pain.  He felt that the team made a sudden movement with a popping sensation in the neck and has had some soreness in the posterior neck region that also radiates to the upper back and shoulder regions.  Pain is fairly mild, but he is embarking on a trip soon and did not want his symptoms become worse.  No real radicular symptoms.  No arm weakness or paresthesias.  He has a history of migraine headaches, which have been stable  Current Medications (verified): 1)  Colazal 750 Mg Caps (Balsalazide Disodium) .... Take 3 Tablets By Mouth Two Times A Day 2)  Flonase 50 Mcg/act Susp (Fluticasone Propionate) .... Oen Spray Each Nostril Twice A Day 3)  Imitrex 100 Mg Tabs (Sumatriptan Succinate) .... Take As Needed For Migraines 4)  Doxepin Hcl 50 Mg  Caps (Doxepin Hcl) .... Take 1 Tab By Mouth At Bedtime 5)  Loratadine 10 Mg  Tabs (Loratadine) .... Take 1 Tablet By Mouth Once A Day 6)  Vitamin D 50000 Unit  Caps (Ergocalciferol) .Marland Kitchen.. 1 Every Week 7)  Canasa 1000 Mg  Supp (Mesalamine) .... One Suppository Every Night 8)  Krill 300 Mg .Marland Kitchen.. 1 Once Daily 9)  Diphenhydramine Hcl 25 Mg Tabs (Diphenhydramine Hcl) .Marland Kitchen.. 1 At Bedtime 10)  Hydrocortisone Acetate 30 Mg Supp (Hydrocortisone Acetate) .... Apply Per Rectum Daily As Directed 11)  Endocet 5-325 Mg Tabs (Oxycodone-Acetaminophen) .... One By Mouth Q 6 Hours As Needed Head Ache 12)  Cipro 500 Mg Tabs (Ciprofloxacin Hcl) .... One By Mouth Two  Times A Day X 10 Days 13)  Promethazine Hcl 25 Mg Tabs (Promethazine Hcl) .... One By Mouth Q 6-8 Hours As Needed Nausea and Vomiting 14)  Malarone 250-100 Mg Tabs (Atovaquone-Proguanil Hcl) .... One By Mouth Daily With Milk or Food 2 Before Entering Areas of Malaria, and Continue 7 Days While There  Allergies (verified): No Known Drug Allergies  Past History:  Past Medical History: Reviewed history from 05/06/2008 and no changes required. Allergic rhinitis Headache GERD Ulcerative proctosigmoiditis Internal hemorrhoids  Physical Exam  General:  Well-developed,well-nourished,in no acute distress; alert,appropriate and cooperative throughout examination Neck:  full range of motion.  No significant localized tenderness   Impression & Recommendations:  Problem # 1:  NECK PAIN (ICD-723.1)  His updated medication list for this problem includes:    Endocet 5-325 Mg Tabs (Oxycodone-acetaminophen) ..... One by mouth q 6 hours as needed head ache appears to be musculoligamentous; will treat with heat anti-inflammatories, and gentle stretching  Problem # 2:  HEADACHE (ICD-784.0)  His updated medication list for this problem includes:    Imitrex 100 Mg Tabs (Sumatriptan succinate) .Marland Kitchen... Take as needed for migraines    Endocet 5-325 Mg Tabs (Oxycodone-acetaminophen) ..... One by mouth q 6 hours as needed head ache  Complete Medication  List: 1)  Colazal 750 Mg Caps (Balsalazide disodium) .... Take 3 tablets by mouth two times a day 2)  Flonase 50 Mcg/act Susp (Fluticasone propionate) .... Oen spray each nostril twice a day 3)  Imitrex 100 Mg Tabs (Sumatriptan succinate) .... Take as needed for migraines 4)  Doxepin Hcl 50 Mg Caps (Doxepin hcl) .... Take 1 tab by mouth at bedtime 5)  Loratadine 10 Mg Tabs (Loratadine) .... Take 1 tablet by mouth once a day 6)  Vitamin D 50000 Unit Caps (Ergocalciferol) .Marland Kitchen.. 1 every week 7)  Canasa 1000 Mg Supp (Mesalamine) .... One suppository every  night 8)  Krill 300 Mg  .Marland Kitchen.. 1 once daily 9)  Diphenhydramine Hcl 25 Mg Tabs (Diphenhydramine hcl) .Marland Kitchen.. 1 at bedtime 10)  Hydrocortisone Acetate 30 Mg Supp (Hydrocortisone acetate) .... Apply per rectum daily as directed 11)  Endocet 5-325 Mg Tabs (Oxycodone-acetaminophen) .... One by mouth q 6 hours as needed head ache 12)  Cipro 500 Mg Tabs (Ciprofloxacin hcl) .... One by mouth two times a day x 10 days 13)  Promethazine Hcl 25 Mg Tabs (Promethazine hcl) .... One by mouth q 6-8 hours as needed nausea and vomiting 14)  Malarone 250-100 Mg Tabs (Atovaquone-proguanil hcl) .... One by mouth daily with milk or food 2 before entering areas of malaria, and continue 7 days while there  Patient Instructions: 1)  Take 400-61m of Ibuprofen (Advil, Motrin) with food every 4-6 hours as needed for relief of pain or comfort of fever. 2)  You may move around but avoid painful motions. Apply ice to sore area for 20 minutes 3-4 times a day for 2-3 days.

## 2010-11-11 NOTE — Assessment & Plan Note (Signed)
Summary: cpx/njr   Vital Signs:  Patient profile:   50 year old male Height:      70 inches Weight:      166 pounds BMI:     23.90 Temp:     98.2 degrees F oral Pulse rate:   68 / minute Resp:     12 per minute BP sitting:   118 / 70  (left arm)  Vitals Entered By: Allyne Gee, LPN (May 03, 8100 75:10 AM) CC: cpx Is Patient Diabetic? No   Primary Care Provider:  Benay Pillow, MD   CC:  cpx.  History of Present Illness: The pt was asked about all immunizations, health maint. services that are appropriate to their age and was given guidance on diet exercize  and weight management  seborhea of scalp with self treatment with sulfacetomide and zinc shampoo  but this no longer keeps this in check he has noted many different skinn issue which may be psoriaform related to his colitis head aches hve been stable he wishes to reduce the dose of the tricyclic   Preventive Screening-Counseling & Management  Alcohol-Tobacco     Smoking Status: never  Problems Prior to Update: 1)  Neck Pain  (ICD-723.1) 2)  Vitamin D Deficiency  (ICD-268.9) 3)  Allergic Rhinitis, Due To Food  (ICD-477.1) 4)  Hx of Acute Frontal Sinusitis  (ICD-461.1) 5)  Preventive Health Care  (ICD-V70.0) 6)  Ulcerative Colitis-left Side  (ICD-556.5) 7)  Gerd  (ICD-530.81) 8)  Ulcerative Colitis  (ICD-556.9) 9)  Internal Hemorrhoids  (ICD-455.0) 10)  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) 11)  Screening For Osteoporosis  (ICD-V82.81) 12)  Weight Loss, Recent  (ICD-783.21) 13)  Hyperlipidemia  (ICD-272.4) 14)  Advef, Drug/medicinal/biological Subst Nos  (ICD-995.20) 15)  Family History Diabetes 1st Degree Relative  (ICD-V18.0) 16)  Headache  (ICD-784.0) 17)  Allergic Rhinitis  (ICD-477.9)  Current Problems (verified): 1)  Neck Pain  (ICD-723.1) 2)  Vitamin D Deficiency  (ICD-268.9) 3)  Allergic Rhinitis, Due To Food  (ICD-477.1) 4)  Hx of Acute Frontal Sinusitis  (ICD-461.1) 5)  Preventive  Health Care  (ICD-V70.0) 6)  Ulcerative Colitis-left Side  (ICD-556.5) 7)  Gerd  (ICD-530.81) 8)  Ulcerative Colitis  (ICD-556.9) 9)  Internal Hemorrhoids  (ICD-455.0) 10)  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 Take 3 Tablets By Mouth Two Times A Day 2)  Flonase 50 Mcg/act Susp (Fluticasone Propionate) .... Oen Spray Each Nostril Twice A Day 3)  Imitrex 100 Mg Tabs (Sumatriptan Succinate) .... Take As Needed For Migraines 4)  Doxepin Hcl 50 Mg  Caps (Doxepin Hcl) .... Take 1 Tab By Mouth At Bedtime 5)  Loratadine 10 Mg  Tabs (Loratadine) .... Take 1 Tablet By Mouth Once A Day 6)  Vitamin D 50000 Unit  Caps (Ergocalciferol) .Marland Kitchen.. 1 Every Week 7)  Canasa 1000 Mg  Supp (Mesalamine) .... One Suppository Every Night 8)  Krill 300 Mg .Marland Kitchen.. 1 Once Daily 9)  Diphenhydramine Hcl 25 Mg Tabs (Diphenhydramine Hcl) .Marland Kitchen.. 1 At Bedtime 10)  Hydrocortisone Acetate 30 Mg Supp (Hydrocortisone Acetate) .... Apply Per Rectum Daily As Directed 11)  Endocet 5-325 Mg  Tabs (Oxycodone-Acetaminophen) .... One By Mouth Q 6 Hours As Needed Head Ache 12)  Cipro 500 Mg Tabs (Ciprofloxacin Hcl) .... One By Mouth Two Times A Day X 10 Days 13)  Promethazine Hcl 25 Mg Tabs (Promethazine Hcl) .... One By Mouth Q 6-8 Hours As Needed Nausea and Vomiting 14)  Malarone 250-100 Mg Tabs (Atovaquone-Proguanil Hcl) .... One By Mouth Daily With Milk or Food 2 Before Entering Areas of Malaria, and Continue 7 Days While There  Current Medications (verified): 1)  Colazal 750 Mg Caps (Balsalazide Disodium) .... Take 3 Tablets By Mouth Two Times A  Day 2)  Flonase 50 Mcg/act Susp (Fluticasone Propionate) .... Oen Spray Each Nostril Twice A Day 3)  Imitrex 100 Mg Tabs (Sumatriptan Succinate) .... Take As Needed For Migraines 4)  Doxepin Hcl 25 Mg Caps (Doxepin Hcl) .... One By Mouth  At Bed Time 5)  Loratadine 10 Mg  Tabs (Loratadine) .... Take 1 Tablet By Mouth Once A Day 6)  Vitamin D 50000 Unit  Caps (Ergocalciferol) .Marland Kitchen.. 1 Every Week 7)  Canasa 1000 Mg  Supp (Mesalamine) .... One Suppository Every3rd  Night 8)  Omega-3 Krill Oil 300 Mg Caps (Krill Oil) .... One By Mouth Tid 9)  Diphenhydramine Hcl 25 Mg Tabs (Diphenhydramine Hcl) .Marland Kitchen.. 1 At Bedtime 10)  Endocet 5-325 Mg Tabs (Oxycodone-Acetaminophen) .... One By Mouth Q 6 Hours As Needed Head Ache 11)  Clobetasol Propionate 0.05 % Foam (Clobetasol Propionate) .... Apply To Scalp Daily As Need  Allergies (verified): No Known Drug Allergies  Past History:  Family History: Last updated: 05/29/2009 father with hypothyroidism aortic acrch dilation Borther probable Marfan's disease with aortic root repair AF mother  Family History of Arthritis wth hip replacement. Family History of Colon Cancer: Grandmothers Family Family History of Colitis/Crohn's: Aunt (crohns), cousine (crohns) Family History of Colon Polyps: Grandmother, Great Aunts, Great Uncles Family History of Heart Disease: Brother, Father  Social History: Last updated: 05/06/2008 Domestic Partner Never Smoked Alcohol use-yes-1 glass nightly Occupation:  Probation officer Daily Caffeine Use Illicit Drug Use - no Patient gets regular exercise.  Risk Factors: Exercise: yes (05/06/2008)  Risk Factors: Smoking Status: never (05/03/2010)  Past medical, surgical, family and social histories (including risk factors) reviewed, and no changes noted (except as noted below).  Past Medical History: Reviewed history from 05/06/2008 and no changes required. Allergic rhinitis Headache GERD Ulcerative  proctosigmoiditis Internal hemorrhoids  Past Surgical History: Reviewed history from 05/06/2008 and no changes required. Eye Muscle transplant- Bilateral  Family History: Reviewed history from 05/29/2009 and no changes required. father with hypothyroidism aortic acrch dilation Borther probable Marfan's disease with aortic root repair AF mother  Family History of Arthritis wth hip replacement. Family History of Colon Cancer: Grandmothers Family Family History of Colitis/Crohn's: Aunt (crohns), cousine (crohns) Family History of Colon Polyps: Grandmother, Great Aunts, Great Uncles Family History of Heart Disease: Brother, Father  Social History: Reviewed history from 05/06/2008 and no changes required. Domestic Partner Never Smoked Alcohol use-yes-1 glass nightly Occupation:  Probation officer Daily Caffeine Use Illicit Drug Use - no Patient gets regular exercise.  Review of Systems  The patient denies anorexia, fever, weight loss, weight gain, vision loss, decreased hearing, hoarseness, chest pain, syncope, dyspnea on exertion, peripheral edema, prolonged cough, headaches, hemoptysis, abdominal pain, melena, hematochezia, severe indigestion/heartburn, hematuria, incontinence, genital sores, muscle weakness, suspicious skin lesions, transient blindness, difficulty walking, depression, unusual weight change, abnormal bleeding, enlarged lymph nodes, angioedema, breast masses, and testicular masses.  Physical Exam  General:  Well-developed,well-nourished,in no acute distress; alert,appropriate and cooperative throughout examination Head:  Normocephalic and atraumatic. Eyes:  pupils equal and pupils round.   Ears:  R ear normal and L ear normal.   Nose:  no external deformity and no nasal discharge.   Mouth:  No deformity or lesions, dentition normal. Neck:  full range of motion.  No significant localized tenderness Chest Wall:  No deformities, masses, tenderness or  gynecomastia noted. Lungs:  Clear throughout to auscultation. Heart:  normal rate, regular rhythm, and Grade   1/6 systolic ejection murmur.   Abdomen:  Soft, nontender and nondistended. No masses, hepatosplenomegaly or hernias noted. Normal bowel sounds. Genitalia:  Testes bilaterally descended without nodularity, tenderness or masses. No scrotal masses or lesions. No penis lesions or urethral discharge. Prostate:  Prostate gland firm and smooth, no enlargement, nodularity, tenderness, mass, asymmetry or induration. Msk:  No deformity or scoliosis noted of thoracic or lumbar spine.   Pulses:  Normal pulses noted. Extremities:  No clubbing, cyanosis, edema, or deformity noted with normal full range of motion of all joints.   Neurologic:  No cranial nerve deficits noted. Station and gait are normal. Plantar reflexes are down-going bilaterally. DTRs are symmetrical throughout. Sensory, motor and coordinative functions appear intact. Skin:  scalp with psoriaform lesions Cervical Nodes:  No lymphadenopathy noted Axillary Nodes:  No palpable lymphadenopathy Inguinal Nodes:  No significant adenopathy   Impression & Recommendations:  Problem # 1:  HYPERLIPIDEMIA (ICD-272.4)  Labs Reviewed: SGOT: 13 (04/26/2010)   SGPT: 9 (04/26/2010)  Lipid Goals: Chol Goal: 200 (08/22/2007)   HDL Goal: 40 (08/22/2007)   LDL Goal: 160 (08/22/2007)   TG Goal: 150 (08/22/2007)  Prior 10 Yr Risk Heart Disease: 4 % (08/22/2007)   HDL:64.90 (04/26/2010), 53.50 (04/24/2009)  LDL:102 (04/24/2009), 132 (08/13/2008)  Chol:213 (04/26/2010), 166 (04/24/2009)  Trig:93.0 (04/26/2010), 55.0 (04/24/2009)  Problem # 2:  [place] (ICD-V70.0)  Td Booster: Historical (05/09/2006)   Flu Vax: Fluvax 3+ (07/31/2009)   Chol: 213 (04/26/2010)   HDL: 64.90 (04/26/2010)   LDL: 102 (04/24/2009)   TG: 93.0 (04/26/2010) TSH: 0.73 (04/26/2010)    Discussed using sunscreen, use of alcohol, drug use, self testicular exam,  routine dental care, routine eye care, routine physical exam, seat belts, multiple vitamins, osteoporosis prevention, adequate calcium intake in diet, and recommendations for immunizations.  Discussed exercise and checking cholesterol.  Discussed gun safety, safe sex, and contraception. Also recommend checking PSA.  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 Take 3 tablets by mouth two times a day 2)  Flonase 50 Mcg/act Susp (Fluticasone propionate) .... Oen spray each nostril twice a day 3)  Imitrex 100 Mg Tabs (Sumatriptan succinate) .... Take as needed for migraines 4)  Doxepin Hcl 25 Mg Caps (Doxepin hcl) .... One by mouth  at bed time 5)  Loratadine 10 Mg Tabs (Loratadine) .... Take 1 tablet by mouth once a day 6)  Vitamin D 50000 Unit Caps (Ergocalciferol) .Marland Kitchen.. 1 every week 7)  Canasa 1000 Mg Supp (Mesalamine) .... One suppository every3rd  night 8)  Omega-3 Krill Oil 300 Mg Caps (Krill oil) .... One by mouth tid 9)  Diphenhydramine Hcl 25 Mg Tabs (Diphenhydramine  hcl) .... 1 at bedtime 10)  Endocet 5-325 Mg Tabs (Oxycodone-acetaminophen) .... One by mouth q 6 hours as needed head ache 11)  Clobetasol Propionate 0.05 % Foam (Clobetasol propionate) .... Apply to scalp daily as need  Patient Instructions: 1)  Please schedule a follow-up appointment in 2 months. Prescriptions: COLAZAL 750 MG CAPS (BALSALAZIDE DISODIUM) Take 3 tablets by mouth two times a day  #540 x 3   Entered by:   Allyne Gee, LPN   Authorized by:   Ricard Dillon MD   Signed by:   Allyne Gee, LPN on 65/12/5463   Method used:   Electronically to        [facility])       [address]       [place], [place]  [address]       Ph: [phone]       Fax: [phone]   RxID:   [id number] DOXEPIN HCL 25 MG CAPS (DOXEPIN HCL) one by mouth  at bed time  #30 x 3   Entered and Authorized by:   Ricard Dillon MD   Signed by:   Ricard Dillon MD on 05/03/2010   Method used:   Electronically to        [facility] ([facility])       [address]       [place], [place]  [address]       Ph: [phone]       Fax: [phone]   RxID:   [id number] CLOBETASOL PROPIONATE 0.05 % FOAM (CLOBETASOL PROPIONATE) apply to scalp daily as need  #1 disp x 11   Entered and Authorized by:   Ricard Dillon MD   Signed by:   Ricard Dillon MD on 05/03/2010   Method used:   Electronically to        [facility] (retail)       [address]       [place], [place]  [address]       Ph: [phone]       Fax: [phone]   RxID:   [id number] DOXEPIN HCL 50 MG  CAPS (DOXEPIN HCL) Take 1 tab by mouth at bedtime  #90 x 3   Entered and Authorized by:   Ricard Dillon MD   Signed by:   Ricard Dillon MD on 05/03/2010   Method used:   Electronically to        [facility] (retail)       [address]       [place], [place]  [address]       Ph: [phone]       Fax: [phone]   RxID:   [id number] VITAMIN D 07867 UNIT  CAPS (ERGOCALCIFEROL) 1 every week  #12 x 3   Entered and Authorized by:   Ricard Dillon MD   Signed by:   Ricard Dillon MD on 05/03/2010   Method used:   Electronically to        [facility] ([facility])       [address]       [place],   [address]       Ph:  [phone]       Fax: [phone]   RxID:   [id number] IMITREX 100 MG TABS (SUMATRIPTAN SUCCINATE) Take as needed for migraines  #9 x 11   Entered and Authorized by:   Ricard Dillon MD   Signed by:   Ricard Dillon MD on 05/03/2010   Method  used:   Electronically to        [facility] ([facility])       [address]       [place], [place]  [address]       Ph: [phone]       Fax: [phone]   RxID:   [id number] FLONASE 50 MCG/ACT SUSP (FLUTICASONE PROPIONATE) oen spray each nostril twice a day  #3 x 3   Entered and Authorized by:   Ricard Dillon MD   Signed by:   Ricard Dillon MD on 05/03/2010   Method used:   Electronically to        [facility] ([facility])       [address]       [place], [place]  [address]       Ph: [phone]       Fax: [phone]   RxID:   [id number] COLAZAL 750 MG CAPS (BALSALAZIDE DISODIUM) Take 3 tablets by mouth two times a day  #270 x 3   Entered and Authorized by:   Ricard Dillon MD   Signed by:   Ricard Dillon MD on 05/03/2010   Method used:   Electronically to        [facility] (retail)       [address]       [place],   [address]       Ph: [phone]       Fax: [phone]   RxID:   [id number] [place] 300 MG CAPS ([place]) one by mouth TID  #90 x 11   Entered and Authorized by:   Ricard Dillon MD   Signed by:   Ricard Dillon MD on 05/03/2010   Method used:   Print then Give to Patient   RxID:   [id number]

## 2010-11-11 NOTE — Assessment & Plan Note (Signed)
Summary: 3 MONTH ROV/NJR   Vital Signs:  Patient profile:   50 year old male Height:      70 inches Weight:      170 pounds BMI:     24.48 Temp:     98.2 degrees F oral Pulse rate:   72 / minute Resp:     14 per minute BP sitting:   136 / 80  (left arm)  Vitals Entered By: Allyne Gee, LPN (October 12, 8370 10:03 AM) CC: roa, Lipid Management, Headache Is Patient Diabetic? No   Primary Care Provider:  Benay Pillow, MD   CC:  roa, Lipid Management, and Headache.  History of Present Illness: Noted tenderness in the side of the knee... had noted pain in the knee with bending concerned about relation to the colitis persistant, responds only partially to advil the pain has been present for 30 days Has sucessfully tapered off the doxipin. Had a three day low grade migraine... responded to the endocet. has reduced caffine has helped The small migraines respond to triptans Sleep has not been effected with the taper at this point Feels a little less focused. No anxiety Mild increase in duodenal pain  took prilosec and this calmed the pain The prilosec has increased his GI motility Has mild flair of scalp issues  Headache HPI:      The patient comes in for chronic management of stable headaches.  Since the last visit, the frequency of headaches have not changed, and the intensity of the headaches have not changed.         Lipid Management History:      Positive NCEP/ATP III risk factors include male age 60 years old or older.  Negative NCEP/ATP III risk factors include non-diabetic, HDL cholesterol greater than 60, non-tobacco-user status, non-hypertensive, no ASHD (atherosclerotic heart disease), no prior stroke/TIA, no peripheral vascular disease, and no history of aortic aneurysm.      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) 5)  Neck Pain  (ICD-723.1) 6)  Vitamin D Deficiency  (ICD-268.9) 7)  Allergic Rhinitis, Due To Food  (ICD-477.1) 8)  Hx of Acute Frontal Sinusitis  (ICD-461.1) 9)  Preventive Health Care  (ICD-V70.0) 10)  Ulcerative Colitis-left Side  (ICD-556.5) 11)  Gerd  (ICD-530.81) 12)  Ulcerative Colitis  (ICD-556.9) 13)  Internal Hemorrhoids  (ICD-455.0) 14)  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) 15)  Screening For Osteoporosis  (ICD-V82.81) 16)  Weight Loss, Recent  (ICD-783.21) 17)  Hyperlipidemia  (ICD-272.4) 18)  Advef, Drug/medicinal/biological Subst Nos  (ICD-995.20) 19)  Family History Diabetes 1st Degree Relative  (ICD-V18.0) 20)  Headache  (ICD-784.0) 21)  Allergic Rhinitis  (ICD-477.9)  Current Problems (verified): 1)  Epigastric Pain  (ICD-789.06) 2)  Contusion of Multiple Sites of Trunk  (ICD-922.8) 3)  Chest Wall Pain, Acute  (ICD-786.52) 4)  Psoriasis, 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) 5)  Neck Pain  (ICD-723.1) 6)  Vitamin D Deficiency  (ICD-268.9) 7)  Allergic Rhinitis, Due To Food  (ICD-477.1) 8)  Hx of Acute Frontal Sinusitis  (ICD-461.1) 9)  Preventive Health Care  (ICD-V70.0) 10)  Ulcerative Colitis-left Side  (ICD-556.5) 11)  Gerd  (ICD-530.81) 12)  Ulcerative Colitis  (ICD-556.9) 13)  Internal Hemorrhoids  (ICD-455.0) 14)  Ulcerative Proctosigmoiditis  (  ICD-556.3) 15)  Screening For Osteoporosis  (ICD-V82.81) 16)  Weight Loss, Recent  (ICD-783.21) 17)  Hyperlipidemia  (ICD-272.4) 18)  Advef, Drug/medicinal/biological Subst Nos  (ICD-995.20) 19)  Family History Diabetes 1st Degree Relative  (ICD-V18.0) 20)  Headache  (ICD-784.0) 21)  Allergic Rhinitis  (ICD-477.9)  Medications Prior to Update: 1)  Colazal 750 Mg Caps (Balsalazide Disodium) .... Take 2 Tablets By Mouth Two Times A Day 2)  Flonase 50 Mcg/act Susp (Fluticasone Propionate) .... Oen Spray Each Nostril Twice A  Day 3)  Imitrex 100 Mg Tabs (Sumatriptan Succinate) .... Take As Needed For Migraines 4)  Doxepin Hcl 25 Mg Caps (Doxepin Hcl) .... One By Mouth  At Bed Time 5)  Loratadine 10 Mg  Tabs (Loratadine) .... Take 1 Tablet By Mouth Once A Day 6)  Vitamin D 50000 Unit  Caps (Ergocalciferol) .Marland Kitchen.. 1 Every Week 7)  Canasa 1000 Mg  Supp (Mesalamine) .... One Suppository Every3rd  Night 8)  Omega-3 Krill Oil 300 Mg Caps (Krill Oil) .... One By Mouth Tid 9)  Diphenhydramine Hcl 25 Mg Tabs (Diphenhydramine Hcl) .Marland Kitchen.. 1 At Bedtime 10)  Endocet 5-325 Mg Tabs (Oxycodone-Acetaminophen) .... One By Mouth Q 6 Hours As Needed Head Ache 11)  Clobetasol Propionate 0.05 % Foam (Clobetasol Propionate) .... Apply To Scalp Daily As Need 12)  Prilosec Otc 20 Mg Tbec (Omeprazole Magnesium) .... Qd  Current Medications (verified): 1)  Colazal 750 Mg Caps (Balsalazide Disodium) .... Take 2 Tablets By Mouth Two Times A Day 2)  Flonase 50 Mcg/act Susp (Fluticasone Propionate) .... Oen Spray Each Nostril Twice A Day 3)  Imitrex 100 Mg Tabs (Sumatriptan Succinate) .... Take As Needed For Migraines 4)  Doxepin Hcl 25 Mg Caps (Doxepin Hcl) .... One By Mouth  At Bed Time 5)  Loratadine 10 Mg  Tabs (Loratadine) .... Take 1 Tablet By Mouth Once A Day 6)  Vitamin D 50000 Unit  Caps (Ergocalciferol) .Marland Kitchen.. 1 Every Week 7)  Canasa 1000 Mg  Supp (Mesalamine) .... One Suppository Every3rd  Night 8)  Omega-3 Krill Oil 300 Mg Caps (Krill Oil) .... One By Mouth Tid 9)  Diphenhydramine Hcl 25 Mg Tabs (Diphenhydramine Hcl) .Marland Kitchen.. 1 At Bedtime 10)  Endocet 5-325 Mg Tabs (Oxycodone-Acetaminophen) .... One By Mouth Q 6 Hours As Needed Head Ache 11)  Clobetasol Propionate 0.05 % Foam (Clobetasol Propionate) .... Apply To Scalp Daily As Need 12)  Prilosec Otc 20 Mg Tbec (Omeprazole Magnesium) .... Qd  Allergies (verified): No Known Drug Allergies  Past History:  Family History: Last updated: 05/29/2009 father with hypothyroidism aortic acrch  dilation Borther probable Marfan's disease with aortic root repair AF mother  Family History of Arthritis wth hip replacement. Family History of Colon Cancer: Grandmothers Family Family History of Colitis/Crohn's: Aunt (crohns), cousine (crohns) Family History of Colon Polyps: Grandmother, Great Aunts, Great Uncles Family History of Heart Disease: Brother, Father  Social History: Last updated: 05/06/2008 Domestic Partner Never Smoked Alcohol use-yes-1 glass nightly Occupation:  Probation officer Daily Caffeine Use Illicit Drug Use - no Patient gets regular exercise.  Risk Factors: Exercise: yes (05/06/2008)  Risk Factors: Smoking Status: never (10/12/2010)  Past medical, surgical, family and social histories (including risk factors) reviewed, and no changes noted (except as noted below).  Past Medical History: Reviewed history from 07/05/2010 and no changes required. Allergic rhinitis Headache GERD Ulcerative proctosigmoiditis 2001 Internal hemorrhoids  Past Surgical History: Reviewed history from 05/06/2008 and no changes required. Eye Muscle transplant- Bilateral  Family History: Reviewed  history from 05/29/2009 and no changes required. father with hypothyroidism aortic acrch dilation Borther probable Marfan's disease with aortic root repair AF mother  Family History of Arthritis wth hip replacement. Family History of Colon Cancer: Grandmothers Family Family History of Colitis/Crohn's: Aunt (crohns), cousine (crohns) Family History of Colon Polyps: Grandmother, Great Aunts, Great Uncles Family History of Heart Disease: Brother, Father  Social History: Reviewed history from 05/06/2008 and no changes required. Domestic Partner Never Smoked Alcohol use-yes-1 glass nightly Occupation:  Probation officer Daily Caffeine Use Illicit Drug Use - no Patient gets regular exercise.  Review of Systems  The patient denies anorexia, fever, weight loss, weight gain,  vision loss, decreased hearing, hoarseness, chest pain, syncope, dyspnea on exertion, peripheral edema, prolonged cough, headaches, hemoptysis, abdominal pain, melena, hematochezia, severe indigestion/heartburn, hematuria, incontinence, genital sores, muscle weakness, suspicious skin lesions, transient blindness, difficulty walking, depression, unusual weight change, abnormal bleeding, enlarged lymph nodes, angioedema, and breast masses.    Physical Exam  General:  Well-developed,well-nourished,in no acute distress; alert,appropriate and cooperative throughout examination; blood pressure 120/80 Head:  Normocephalic and atraumatic. Eyes:  pupils equal and pupils round.   Ears:  R ear normal and L ear normal.   Nose:  no external deformity and no nasal discharge.   Mouth:  No deformity or lesions, dentition normal. Neck:  full range of motion.  No significant localized tenderness Lungs:  Normal respiratory effort, chest expands symmetrically. Lungs are clear to auscultation, no crackles or wheezes. Heart:  no tachycardia Msk:  decreased ROM, joint tenderness, and joint swelling.   Neurologic:  No cranial nerve deficits noted. Station and gait are normal. Plantar reflexes are down-going bilaterally. DTRs are symmetrical throughout. Sensory, motor and coordinative functions appear intact.   Impression & Recommendations:  Problem # 1:  EPIGASTRIC PAIN (ICD-789.06)  His updated medication list for this problem includes:    Colazal 750 Mg Caps (Balsalazide disodium) .Marland Kitchen... Take 2 tablets by mouth two times a day    Canasa 1000 Mg Supp (Mesalamine) ..... One suppository every3rd  night  Discussed symptom control with the patient.   Problem # 2:  PSORIASIS, SCALP (ICD-696.1) stable  Problem # 3:  ULCERATIVE COLITIS-LEFT SIDE (ICD-556.5) Assessment: Unchanged  Problem # 4:  ALLERGIC RHINITIS, DUE TO FOOD (ICD-477.1) Assessment: Unchanged  Problem # 5:  HYPERLIPIDEMIA (ICD-272.4) Assessment:  Unchanged  Labs Reviewed: SGOT: 13 (04/26/2010)   SGPT: 9 (04/26/2010)  Lipid Goals: Chol Goal: 200 (08/22/2007)   HDL Goal: 40 (08/22/2007)   LDL Goal: 160 (08/22/2007)   TG Goal: 150 (08/22/2007)  Prior 10 Yr Risk Heart Disease: 4 % (08/22/2007)   HDL:64.90 (04/26/2010), 53.50 (04/24/2009)  LDL:102 (04/24/2009), 132 (08/13/2008)  Chol:213 (04/26/2010), 166 (04/24/2009)  Trig:93.0 (04/26/2010), 55.0 (04/24/2009)  Problem # 6:  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 After care discussed. Pt tolerated procedure well.  His updated medication list for this problem includes:    Endocet 5-325 Mg Tabs (Oxycodone-acetaminophen) ..... One by mouth q 6 hours as needed head ache  Discussed use of medications, application of heat or cold, and exercises.   Orders: Joint Aspirate / Injection, Large (20610) Depo- Medrol 47m (J1030)  Complete Medication List: 1)  Colazal 750 Mg Caps (Balsalazide disodium) .... Take 2 tablets by mouth two times a day 2)  Flonase 50 Mcg/act Susp (Fluticasone propionate) .... Oen spray each nostril twice a day 3)  Imitrex 100 Mg Tabs (Sumatriptan succinate) .... Take as needed for migraines 4)  Doxepin Hcl 25 Mg Caps (Doxepin hcl) .... One by mouth  at bed time 5)  Loratadine 10 Mg Tabs (Loratadine) .... Take 1 tablet by mouth once a day 6)  Vitamin D 50000 Unit Caps (Ergocalciferol) .Marland Kitchen.. 1 every week 7)  Canasa 1000 Mg Supp (Mesalamine) .... One suppository every3rd  night 8)  Omega-3 Krill Oil 300 Mg Caps (Krill oil) .... One by mouth tid 9)  Diphenhydramine Hcl 25 Mg Tabs (Diphenhydramine hcl) .Marland Kitchen.. 1 at bedtime 10)  Endocet 5-325 Mg Tabs (Oxycodone-acetaminophen) .... One by mouth q 6 hours as needed head ache 11)  Clobetasol Propionate 0.05 % Foam  (Clobetasol propionate) .... Apply to scalp daily as need 12)  Prilosec Otc 20 Mg Tbec (Omeprazole magnesium) .... Qd  Lipid Assessment/Plan:      Based on NCEP/ATP III, the patient's risk factor category is "0-1 risk factors".  The patient's lipid goals are as follows: Total cholesterol goal is 200; LDL cholesterol goal is 160; HDL cholesterol goal is 40; Triglyceride goal is 150.  His LDL cholesterol goal has been met.    Patient Instructions: 1)  CPX in July Prescriptions: PRILOSEC OTC 20 MG TBEC (OMEPRAZOLE MAGNESIUM) qd  #30 x 11   Entered by:   Allyne Gee, LPN   Authorized by:   Ricard Dillon MD   Signed by:   Allyne Gee, LPN on 04/02/7627   Method used:   Print then Give to Patient   RxID:   [id number] DIPHENHYDRAMINE HCL 25 MG TABS (DIPHENHYDRAMINE HCL) 1 at bedtime  #30 x 11   Entered by:   Allyne Gee, LPN   Authorized by:   Ricard Dillon MD   Signed by:   Allyne Gee, LPN on 69/48/5462   Method used:   Print then Give to Patient   RxID:   [id number] OMEGA-3 KRILL OIL 300 MG CAPS ([place]) one by mouth TID  #90 x 11   Entered by:   Allyne Gee, LPN   Authorized by:   Ricard Dillon MD   Signed by:   Allyne Gee, LPN on 16/96/7893   Method used:   Print then Give to Patient   RxID:   [id number] LORATADINE 10 MG  TABS (LORATADINE) Take 1 tablet by mouth once a day  #30 x 11   Entered by:   Allyne Gee, LPN   Authorized by:   Ricard Dillon MD   Signed by:   Allyne Gee, LPN on 24/23/5361   Method used:   Print then Give to Patient   RxID:   [id number]    Orders Added: 1)  Est. Patient Level IV [93267] 2)  Joint Aspirate / Injection, Large [12458] 3)  Depo- Medrol 85m [[K9983]

## 2010-11-11 NOTE — Letter (Signed)
Summary: Colonoscopy Letter  [facility]  [place], [place] [address]   Phone: [phone]  Fax: [phone]      Feb 23, 2010 MRN: [id number]   [name], [name]  [id number]   Dear [name],   According to your medical record, it is time for you to schedule a Colonoscopy. The American Cancer Society recommends this procedure as a method to detect early colon cancer. Patients with a family history of colon cancer, or a personal history of colon polyps or inflammatory bowel disease are at increased risk.  This letter has beeen generated based on the recommendations made at the time of your procedure. If you feel that in your particular situation this may no longer apply, please contact our office.  Please call our office at [phone] to schedule this appointment or to update your records at your earliest convenience.  Thank you for cooperating with [place] to provide you with the very best care possible.   Sincerely,  Norberto Sorenson T. Fuller Plan, M.D.  [facility] Gastroenterology Division [phone]

## 2010-11-11 NOTE — Letter (Signed)
Summary: Pre Visit Letter Revised  [facility]  [place], [place] [address]   Phone: [phone]  Fax: [phone]        10/12/2010 MRN: [id number] [place] [address] [place], [place]  [address]             Procedure Date:  12-01-10   Welcome to the Gastroenterology Division at [facility]    You are scheduled to see a nurse for your pre-procedure visit on 11-12-10 at 9:00a.m. on the 3rd floor at [facility], [place] [employer].  We ask that you try to arrive at our office 15 minutes prior to your appointment time to allow for check-in.  Please take a minute to review the attached form.  If you answer "Yes" to one or more of the questions on the first page, we ask that you call the person listed at your earliest opportunity.  If you answer "No" to all of the questions, please complete the rest of the form and bring it to your appointment.    Your nurse visit will consist of discussing your medical and surgical history, your immediate family medical history, and your medications.   If you are unable to list all of your medications on the form, please bring the medication bottles to your appointment and we will list them.  We will need to be aware of both prescribed and over the counter drugs.  We will need to know exact dosage information as well.    Please be prepared to read and sign documents such as consent forms, a financial agreement, and acknowledgement forms.  If necessary, and with your consent, a friend or relative is welcome to sit-in on the nurse visit with you.  Please bring your insurance card so that we may make a copy of it.  If your insurance requires a referral to see a specialist, please bring your referral form from your primary care physician.  No co-pay is required for this nurse visit.     If you cannot keep your appointment, please call [phone] to cancel or reschedule prior to your appointment date.  This allows [place]  the opportunity to schedule an appointment for another patient in need of care.    Thank you for choosing [facility] for your medical needs.  We appreciate the opportunity to care for you.  Please visit [place] at our website  to learn more about our practice.  Sincerely, The Gastroenterology Division

## 2010-11-11 NOTE — Assessment & Plan Note (Signed)
Summary: 2 mo rov/mm   Vital Signs:  Patient profile:   50 year old male Height:      70 inches Weight:      166 pounds BMI:     23.90 Temp:     98.2 degrees F oral Pulse rate:   68 / minute Resp:     14 per minute BP sitting:   110 / 72  (left arm)  Vitals Entered By: Allyne Gee, LPN (July 06, 9627 11:09 AM) CC: roa, Lipid Management Is Patient Diabetic? No   Primary Care Provider:  Benay Pillow, MD   CC:  roa and Lipid Management.  History of Present Illness: increased epigastric pain reveiwed the use of prilosec timing of colonoscopy no increased colitis pain in LLQ reviewed prior work up of epigastric pain no dark stools no light headedness   Lipid Management History:      Positive NCEP/ATP III risk factors include male age 41 years old or older.  Negative NCEP/ATP III risk factors include non-diabetic, HDL cholesterol greater than 60, non-tobacco-user status, non-hypertensive, no ASHD (atherosclerotic heart disease), no prior stroke/TIA, no peripheral vascular disease, and no history of aortic aneurysm.     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) 13)  Screening For Osteoporosis  (ICD-V82.81) 14)  Weight Loss, Recent  (ICD-783.21) 15)  Hyperlipidemia  (ICD-272.4) 16)  Advef, Drug/medicinal/biological Subst Nos  (ICD-995.20) 17)  Family History Diabetes 1st Degree Relative  (ICD-V18.0) 18)  Headache   (ICD-784.0) 19)  Allergic Rhinitis  (ICD-477.9)  Current Problems (verified): 1)  Chest Wall Pain, Acute  (ICD-786.52) 2)  Psoriasis, 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) 3)  Neck Pain  (ICD-723.1) 4)  Vitamin D Deficiency  (ICD-268.9) 5)  Allergic Rhinitis, Due To Food  (ICD-477.1) 6)  Hx of Acute Frontal Sinusitis  (ICD-461.1) 7)  Preventive Health Care  (ICD-V70.0) 8)  Ulcerative Colitis-left Side  (ICD-556.5) 9)  Gerd  (ICD-530.81) 10)  Ulcerative Colitis  (ICD-556.9) 11)  Internal Hemorrhoids  (ICD-455.0) 12)  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 Take 3 Tablets By Mouth Two Times A Day 2)  Flonase 50 Mcg/act Susp (Fluticasone Propionate) .... Oen Spray Each Nostril Twice A Day 3)  Imitrex 100 Mg Tabs (Sumatriptan Succinate) .... Take As Needed For Migraines 4)  Doxepin Hcl 25 Mg Caps (Doxepin Hcl) .... One By Mouth  At Bed Time 5)  Loratadine 10 Mg  Tabs (Loratadine) .... Take 1 Tablet By Mouth Once A Day 6)  Vitamin D 50000 Unit  Caps (Ergocalciferol) .Marland Kitchen.. 1 Every Week 7)  Canasa 1000 Mg  Supp (Mesalamine) .... One  Suppository Every3rd  Night 8)  Omega-3 Krill Oil 300 Mg Caps (Krill Oil) .... One By Mouth Tid 9)  Diphenhydramine Hcl 25 Mg Tabs (Diphenhydramine Hcl) .Marland Kitchen.. 1 At Bedtime 10)  Endocet 5-325 Mg Tabs (Oxycodone-Acetaminophen) .... One By Mouth Q 6 Hours As Needed Head Ache 11)  Clobetasol Propionate 0.05 % Foam (Clobetasol Propionate) .... Apply To Scalp Daily As Need 12)  Prilosec Otc 20 Mg Tbec (Omeprazole Magnesium) .... Qd  Current Medications (verified): 1)  Colazal 750 Mg Caps (Balsalazide  Disodium) .... Take 2 Tablets By Mouth Two Times A Day 2)  Flonase 50 Mcg/act Susp (Fluticasone Propionate) .... Oen Spray Each Nostril Twice A Day 3)  Imitrex 100 Mg Tabs (Sumatriptan Succinate) .... Take As Needed For Migraines 4)  Doxepin Hcl 25 Mg Caps (Doxepin Hcl) .... One By Mouth  At Bed Time 5)  Loratadine 10 Mg  Tabs (Loratadine) .... Take 1 Tablet By Mouth Once A Day 6)  Vitamin D 50000 Unit  Caps (Ergocalciferol) .Marland Kitchen.. 1 Every Week 7)  Canasa 1000 Mg  Supp (Mesalamine) .... One Suppository Every3rd  Night 8)  Omega-3 Krill Oil 300 Mg Caps (Krill Oil) .... One By Mouth Tid 9)  Diphenhydramine Hcl 25 Mg Tabs (Diphenhydramine Hcl) .Marland Kitchen.. 1 At Bedtime 10)  Endocet 5-325 Mg Tabs (Oxycodone-Acetaminophen) .... One By Mouth Q 6 Hours As Needed Head Ache 11)  Clobetasol Propionate 0.05 % Foam (Clobetasol Propionate) .... Apply To Scalp Daily As Need 12)  Prilosec Otc 20 Mg Tbec (Omeprazole Magnesium) .... Qd  Allergies (verified): No Known Drug Allergies  Past History:  Family History: Last updated: 05/29/2009 father with hypothyroidism aortic acrch dilation Borther probable Marfan's disease with aortic root repair AF mother  Family History of Arthritis wth hip replacement. Family History of Colon Cancer: Grandmothers Family Family History of Colitis/Crohn's: Aunt (crohns), cousine (crohns) Family History of Colon Polyps: Grandmother, Great Aunts, Great Uncles Family History of Heart Disease: Brother, Father  Social History: Last updated: 05/06/2008 Domestic Partner Never Smoked Alcohol use-yes-1 glass nightly Occupation:  Probation officer Daily Caffeine Use Illicit Drug Use - no Patient gets regular exercise.  Risk Factors: Exercise: yes (05/06/2008)  Risk Factors: Smoking Status: never (07/05/2010)  Past medical, surgical, family and social histories (including risk factors) reviewed, and no changes noted (except as noted below).  Past Medical History: Reviewed  history from 05/06/2008 and no changes required. Allergic rhinitis Headache GERD Ulcerative proctosigmoiditis Internal hemorrhoids  Past Surgical History: Reviewed history from 05/06/2008 and no changes required. Eye Muscle transplant- Bilateral  Family History: Reviewed history from 05/29/2009 and no changes required. father with hypothyroidism aortic acrch dilation Borther probable Marfan's disease with aortic root repair AF mother  Family History of Arthritis wth hip replacement. Family History of Colon Cancer: Grandmothers Family Family History of Colitis/Crohn's: Aunt (crohns), cousine (crohns) Family History of Colon Polyps: Grandmother, Great Aunts, Great Uncles Family History of Heart Disease: Brother, Father  Social History: Reviewed history from 05/06/2008 and no changes required. Domestic Partner Never Smoked Alcohol use-yes-1 glass nightly Occupation:  Probation officer Daily Caffeine Use Illicit Drug Use - no Patient gets regular exercise.  Review of Systems  The patient denies anorexia, fever, weight loss, weight gain, vision loss, decreased hearing, hoarseness, chest pain, syncope, dyspnea on exertion, peripheral edema, prolonged cough, headaches, hemoptysis, abdominal pain, melena, hematochezia, severe indigestion/heartburn, hematuria, incontinence, genital sores, muscle weakness, suspicious skin lesions, transient blindness, difficulty walking, depression, unusual weight change, abnormal bleeding, enlarged lymph nodes, angioedema, and breast  masses.    Physical Exam  General:  Well-developed,well-nourished,in no acute distress; alert,appropriate and cooperative throughout examination; blood pressure 120/80 Head:  Normocephalic and atraumatic. Eyes:  pupils equal and pupils round.   Nose:  no external deformity and no nasal discharge.   Neck:  full range of motion.  No significant localized tenderness Lungs:  Normal respiratory effort, chest expands  symmetrically. Lungs are clear to auscultation, no crackles or wheezes. Heart:  no tachycardia Abdomen:  Soft, nontender and nondistended. No masses, hepatosplenomegaly or hernias noted. Normal bowel sounds. Msk:  No deformity or scoliosis noted of thoracic or lumbar spine.   Pulses:  Normal pulses noted. Extremities:  No clubbing, cyanosis, edema, or deformity noted with normal full range of motion of all joints.   Neurologic:  No cranial nerve deficits noted. Station and gait are normal. Plantar reflexes are down-going bilaterally. DTRs are symmetrical throughout. Sensory, motor and coordinative functions appear intact. Skin:  scalp with psoriaform lesions   Impression & Recommendations:  Problem # 1:  VITAMIN D DEFICIENCY (ICD-268.9)  Td Booster: Historical (05/09/2006)   Flu Vax: Fluvax 3+ (06/16/2010)   Chol: 213 (04/26/2010)   HDL: 64.90 (04/26/2010)   LDL: 102 (04/24/2009)   TG: 93.0 (04/26/2010) TSH: 0.73 (04/26/2010)    Discussed using sunscreen, use of alcohol, drug use, self testicular exam, routine dental care, routine eye care, routine physical exam, seat belts, multiple vitamins, osteoporosis prevention, adequate calcium intake in diet, and recommendations for immunizations.  Discussed exercise and checking cholesterol.  Discussed gun safety, safe sex, and contraception. Also recommend checking PSA.  Problem # 2:  ULCERATIVE COLITIS-LEFT SIDE (ICD-556.5)  Problem # 3:  CONTUSION OF MULTIPLE SITES OF TRUNK (ICD-922.8) the pt had a fall with contusion to right chest was with gradual lessening of pain  Problem # 4:  EPIGASTRIC PAIN (ICD-789.06)  complete 4 weeks of therapy and pain subsided no flair of clolitis His updated medication list for this problem includes:    Colazal 750 Mg Caps (Balsalazide disodium) .Marland Kitchen... Take 2 tablets by mouth two times a day    Canasa 1000 Mg Supp (Mesalamine) ..... One suppository every3rd  night  Discussed symptom control with the patient.    Problem # 5:  HEADACHE (ICD-784.0) has been titrating the doxpen without head ache pattern changing His updated medication list for this problem includes:    Imitrex 100 Mg Tabs (Sumatriptan succinate) .Marland Kitchen... Take as needed for migraines    Endocet 5-325 Mg Tabs (Oxycodone-acetaminophen) ..... One by mouth q 6 hours as needed head ache  Headache diary reviewed. stay on 25 mg until feels stable  Complete Medication List: 1)  Colazal 750 Mg Caps (Balsalazide disodium) .... Take 2 tablets by mouth two times a day 2)  Flonase 50 Mcg/act Susp (Fluticasone propionate) .... Oen spray each nostril twice a day 3)  Imitrex 100 Mg Tabs (Sumatriptan succinate) .... Take as needed for migraines 4)  Doxepin Hcl 25 Mg Caps (Doxepin hcl) .... One by mouth  at bed time 5)  Loratadine 10 Mg Tabs (Loratadine) .... Take 1 tablet by mouth once a day 6)  Vitamin D 50000 Unit Caps (Ergocalciferol) .Marland Kitchen.. 1 every week 7)  Canasa 1000 Mg Supp (Mesalamine) .... One suppository every3rd  night 8)  Omega-3 Krill Oil 300 Mg Caps (Krill oil) .... One by mouth tid 9)  Diphenhydramine Hcl 25 Mg Tabs (Diphenhydramine hcl) .Marland Kitchen.. 1 at bedtime 10)  Endocet 5-325 Mg Tabs (Oxycodone-acetaminophen) .... One by mouth q  6 hours as needed head ache 11)  Clobetasol Propionate 0.05 % Foam (Clobetasol propionate) .... Apply to scalp daily as need 12)  Prilosec Otc 20 Mg Tbec (Omeprazole magnesium) .... Qd  Lipid Assessment/Plan:      Based on NCEP/ATP III, the patient's risk factor category is "0-1 risk factors".  The patient's lipid goals are as follows: Total cholesterol goal is 200; LDL cholesterol goal is 160; HDL cholesterol goal is 40; Triglyceride goal is 150.  His LDL cholesterol goal has been met.    Patient Instructions: 1)  stay on 25 my of doxepin for now, plan to taper off in future with 25 every other day for 2 weeks then as needed use approved 2)  Please schedule a follow-up appointment in 3 months.   Prevention &  Chronic Care Immunizations   Influenza vaccine: Fluvax 3+  (06/16/2010)    Tetanus booster: 05/09/2006: Historical    Pneumococcal vaccine: Not documented  Other Screening  Reports requested:   Last colonoscopy report requested.  Smoking status: never  (07/05/2010)  Lipids   Total Cholesterol: 213  (04/26/2010)   LDL: 102  (04/24/2009)   LDL Direct: 136.2  (04/26/2010)   HDL: 64.90  (04/26/2010)   Triglycerides: 93.0  (04/26/2010)    SGOT (AST): 13  (04/26/2010)   SGPT (ALT): 9  (04/26/2010)   Alkaline phosphatase: 37  (04/26/2010)   Total bilirubin: 1.5  (04/26/2010)  Self-Management Support :    Lipid self-management support: Not documented    Nursing Instructions: Request report of last colonoscopy    Appended Document: Orders Update     Clinical Lists Changes  Orders: Added new Referral order of Gastroenterology Referral (GI) - Signed

## 2010-11-12 ENCOUNTER — Encounter: Payer: Self-pay | Admitting: Gastroenterology

## 2010-11-12 ENCOUNTER — Telehealth (INDEPENDENT_AMBULATORY_CARE_PROVIDER_SITE_OTHER): Payer: Self-pay | Admitting: *Deleted

## 2010-11-12 ENCOUNTER — Ambulatory Visit: Admit: 2010-11-12 | Payer: Self-pay | Admitting: Gastroenterology

## 2010-11-17 NOTE — Progress Notes (Signed)
Summary: New symptoms/ possible EGD   Phone Note Other Incoming   Summary of Call: Patient here today for a pre-visit for colon recall 12/01/10. He is having a gnawing epigastric pain.  He has a hx of duodenitis and duodenal ulcer in the past.  has been treated by Dr Arnoldo Morale with Prilosec with no improvement in symptoms.  Dr Arnoldo Morale had recommended a possible EGD for recurrent symptoms.  Is it ok to schedule a double? or Office visit first?  Please advise Initial call taken by: Barb Merino RN, [place],  November 12, 2010 9:43 AM  Follow-up for Phone Call        OK to schedule EGD with his colon. The pt has not been in for an office visit with me in several years. I strongly recommend office visits with me at least yearly for chronic UC follow up. Follow-up by: Ladene Artist MD [facility] Regal,  November 12, 2010 11:11 AM  Additional Follow-up for Phone Call Additional follow up Details #1::        Pt scheduled for EGD/Colon on 12/01/2010 at 2:00.  Pt notified and updated prep instructions reviewed w/ pt. Additional Follow-up by: Ulice Dash RN,  November 12, 2010 12:16 PM

## 2010-11-17 NOTE — Letter (Signed)
Summary: Moviprep Instructions  [facility]  [address]   [place], [place] [address]   Phone: [phone]  Fax: [phone]       [name]    [birth year]    MRN: [id number]        Procedure Day Sudie Grumbling: Wednesday, 12-01-10     Arrival Time: 8:30 a.m.      Procedure Time: 9:30 a.m.     Location of Procedure:                    x   [place] (4th Floor)   PREPARATION FOR COLONOSCOPY WITH MOVIPREP   Starting 5 days prior to your procedure 11-26-10 do not eat nuts, seeds, popcorn, corn, beans, peas,  salads, or any raw vegetables.  Do not take any fiber supplements (e.g. Metamucil, Citrucel, and Benefiber).  THE DAY BEFORE YOUR PROCEDURE         DATE: 11-30-10   DAY: Tuesday  1.  Drink clear liquids the entire day-NO SOLID FOOD  2.  Do not drink anything colored red or purple.  Avoid juices with pulp.  No orange juice.  3.  Drink at least 64 oz. (8 glasses) of fluid/clear liquids during the day to prevent dehydration and help the prep work efficiently.  CLEAR LIQUIDS INCLUDE: Water Jello Ice Popsicles Tea (sugar ok, no milk/cream) Powdered fruit flavored drinks Coffee (sugar ok, no milk/cream) Gatorade Juice: apple, white grape, white cranberry  Lemonade Clear bullion, consomm, broth Carbonated beverages (any kind) Strained chicken noodle soup Hard Candy                             4.  In the morning, mix first dose of MoviPrep solution:    Empty 1 Pouch A and 1 Pouch B into the disposable container    Add lukewarm drinking water to the top line of the container. Mix to dissolve    Refrigerate (mixed solution should be used within 24 hrs)  5.  Begin drinking the prep at 5:00 p.m. The MoviPrep container is divided by 4 marks.   Every 15 minutes drink the solution down to the next mark (approximately 8 oz) until the full liter is complete.   6.  Follow completed prep with 16 oz of clear liquid of your choice (Nothing red or  purple).  Continue to drink clear liquids until bedtime.  7.  Before going to bed, mix second dose of MoviPrep solution:    Empty 1 Pouch A and 1 Pouch B into the disposable container    Add lukewarm drinking water to the top line of the container. Mix to dissolve    Refrigerate  THE DAY OF YOUR PROCEDURE      DATE: 12-01-10 DAY: Wednesday  Beginning at 4:30 a.m. (5 hours before procedure):         1. Every 15 minutes, drink the solution down to the next mark (approx 8 oz) until the full liter is complete.  2. Follow completed prep with 16 oz. of clear liquid of your choice.    3. You may drink clear liquids until 7:30 a.m.  (2 HOURS BEFORE PROCEDURE).   MEDICATION INSTRUCTIONS  Unless otherwise instructed, you should take regular prescription medications with a small sip of water   as early as possible the morning of your procedure.           OTHER INSTRUCTIONS  You will need  a responsible adult at least 48 years of age to accompany you and drive you home.   This person must remain in the waiting room during your procedure.  Wear loose fitting clothing that is easily removed.  Leave jewelry and other valuables at home.  However, you may wish to bring a book to read or  an iPod/MP3 player to listen to music as you wait for your procedure to start.  Remove all body piercing jewelry and leave at home.  Total time from sign-in until discharge is approximately 2-3 hours.  You should go home directly after your procedure and rest.  You can resume normal activities the  day after your procedure.  The day of your procedure you should not:   Drive   Make legal decisions   Operate machinery   Drink alcohol   Return to work  You will receive specific instructions about eating, activities and medications before you leave.    The above instructions have been reviewed and explained to me by   Ulice Dash RN  November 12, 2010 10:04 AM     I fully understand and  can verbalize these instructions _____________________________ Date _________

## 2010-11-17 NOTE — Miscellaneous (Signed)
Summary: [facility]  Clinical Lists Changes  Medications: Added new medication of MOVIPREP 100 GM  SOLR (PEG-KCL-NACL-NASULF-NA ASC-C) As per prep instructions. - Signed Rx of MOVIPREP 100 GM  SOLR (PEG-KCL-NACL-NASULF-NA ASC-C) As per prep instructions.;  #1 x 0;  Signed;  Entered by: Ulice Dash RN;  Authorized by: Ladene Artist MD [facility];  Method used: Electronically to [facility] [address] [place], [place], [place]  [address], Ph: [phone], Fax: [phone] Observations: Added new observation of NKA: T (11/12/2010 10:03)    Prescriptions: MOVIPREP 100 GM  SOLR (PEG-KCL-NACL-NASULF-NA ASC-C) As per prep instructions.  #1 x 0   Entered by:   Ulice Dash RN   Authorized by:   Ladene Artist MD [facility]   Signed by:   Ulice Dash RN on 11/12/2010   Method used:   Electronically to        [facility]       [address]       [place],   [address]       Ph: [phone]       Fax: [phone]   [place]:   [phone]

## 2010-12-01 ENCOUNTER — Encounter: Payer: Self-pay | Admitting: Gastroenterology

## 2010-12-01 ENCOUNTER — Other Ambulatory Visit: Payer: Self-pay | Admitting: Gastroenterology

## 2010-12-01 ENCOUNTER — Encounter (AMBULATORY_SURGERY_CENTER): Payer: BC Managed Care – PPO | Admitting: Gastroenterology

## 2010-12-01 DIAGNOSIS — K2970 Gastritis, unspecified, without bleeding: Secondary | ICD-10-CM

## 2010-12-01 DIAGNOSIS — R1013 Epigastric pain: Secondary | ICD-10-CM

## 2010-12-01 DIAGNOSIS — K2940 Chronic atrophic gastritis without bleeding: Secondary | ICD-10-CM

## 2010-12-01 DIAGNOSIS — K5100 Ulcerative (chronic) pancolitis without complications: Secondary | ICD-10-CM

## 2010-12-01 DIAGNOSIS — Z1211 Encounter for screening for malignant neoplasm of colon: Secondary | ICD-10-CM

## 2010-12-01 DIAGNOSIS — D126 Benign neoplasm of colon, unspecified: Secondary | ICD-10-CM

## 2010-12-03 ENCOUNTER — Other Ambulatory Visit: Payer: Self-pay | Admitting: Internal Medicine

## 2010-12-06 ENCOUNTER — Encounter: Payer: Self-pay | Admitting: Gastroenterology

## 2010-12-07 NOTE — Procedures (Addendum)
Summary: Colonoscopy  Patient: [name] Note: All result statuses are Final unless otherwise noted.  Tests: (1) Colonoscopy (COL)   COL Colonoscopy           DONE (C)     [address]     [place], [place]  [address]           COLONOSCOPY PROCEDURE REPORT           PATIENT:  Roy, Hawkins  MR#:  [id number]     BIRTHDATE:  07-30-1961, 50 yrs. old  GENDER:  male     ENDOSCOPIST:  Norberto Sorenson T. Fuller Plan, MD, [facility]           PROCEDURE DATE:  12/01/2010     PROCEDURE:  Colonoscopy with biopsy     ASA CLASS:  Class II     INDICATIONS:  1) surveillance and high-risk screening  2)     follow-up of chronic ulcerative colitis     MEDICATIONS:   Fentanyl 100 mcg IV, Versed 10 mg IV     DESCRIPTION OF PROCEDURE:   After the risks benefits and     alternatives of the procedure were thoroughly explained, informed     consent was obtained.  Digital rectal exam was performed and     revealed no abnormalities.   The LB [id number] endoscope     was introduced through the anus and advanced to the cecum, which     was identified by both the appendix and ileocecal valve, without     limitations.  The quality of the prep was good, using MoviPrep.     The instrument was then slowly withdrawn as the colon was fully     examined.     <<PROCEDUREIMAGES>>     FINDINGS:  A normal appearing cecum, ileocecal valve, and     appendiceal orifice were identified. The ascending, hepatic     flexure, transverse, splenic flexure, descending, sigmoid colon,     and rectum appeared unremarkable.  Random biopsies were obtained     and sent to pathology.   Retroflexed views in the rectum revealed     internal hemorrhoids, small.  The time to cecum =  2.5  minutes.     The scope was then withdrawn (time =  10.67  min) from the patient     and the procedure completed.     COMPLICATIONS:  None           ENDOSCOPIC IMPRESSION:     1) Normal colon     2) Internal hemorrhoids         RECOMMENDATIONS:     1) Await pathology results     2) Repeat Colonoscopy in 3 years for UC surveillance     3) Yearly office follow up recommnened     4) Continue Colazal for long term maintenance           Malcolm T. Fuller Plan, MD, [facility] Regal           CC:  Ricard Dillon, MD           n.     REVISED:  12/01/2010 02:45 PM     eSIGNED:   Norberto Sorenson T. Stark at 12/01/2010 02:45 PM           Horatio Pel, [id number]  Note: An exclamation mark (!) indicates a result that was not dispersed into the flowsheet. Document Creation Date: 12/01/2010 2:46 PM _______________________________________________________________________  [facility]  1) Order result status: Final Collection or observation date-time: 12/01/2010 14:17 Requested date-time:  Receipt date-time:  Reported date-time:  Referring Physician:   Ordering Physician: Lucio Edward [id number]) Specimen Source:  Source: Tawanna Cooler Order Number: [id number] Lab site:   Appended Document: Colonoscopy     Procedures Next Due Date:    Colonoscopy: 11/2013

## 2010-12-07 NOTE — Miscellaneous (Signed)
Summary: RX prilosec  Clinical Lists Changes  Medications: Added new medication of PRILOSEC OTC 20 MG  TBEC (OMEPRAZOLE MAGNESIUM) 1 each day 30 minutes before meal - Signed Rx of PRILOSEC OTC 20 MG  TBEC (OMEPRAZOLE MAGNESIUM) 1 each day 30 minutes before meal;  #30 x 2;  Signed;  Entered by: Sundra Aland RN;  Authorized by: Ladene Artist MD [facility];  Method used: Electronically to [facility] [address] [place], [place], [place]  [address], Ph: [phone], Fax: [phone] Observations: Added new observation of ALLERGY REV: Done (12/01/2010 16:56)    Prescriptions: PRILOSEC OTC 20 MG  TBEC (OMEPRAZOLE MAGNESIUM) 1 each day 30 minutes before meal  #30 x 2   Entered by:   Sundra Aland RN   Authorized by:   Ladene Artist MD [facility]   Signed by:   Sundra Aland RN on 12/01/2010   Method used:   Electronically to        [facility]       [address]       [place], [place]  [address]       Ph: [phone]       Fax: [phone]   [place]:   [phone]

## 2010-12-07 NOTE — Procedures (Addendum)
Summary: Upper Endoscopy  Patient: [name] Note: All result statuses are Final unless otherwise noted.  Tests: (1) Upper Endoscopy (EGD)   EGD Upper Endoscopy       [facility] [employer].     [place], [place]  [address]           ENDOSCOPY PROCEDURE REPORT     PATIENT:  [name], [name]  MR#:  [id number]     BIRTHDATE:  [birth year], 50 yrs. old  GENDER:  male     ENDOSCOPIST:  Norberto Sorenson T. Fuller Plan, MD, [facility]           PROCEDURE DATE:  12/01/2010     PROCEDURE:  EGD with biopsy, 54[address]     ASA CLASS:  Class II     INDICATIONS:  epigastric pain     MEDICATIONS:  There was residual sedation effect present from     prior procedure, Fentanyl 25 mcg IV, Versed 2 mg IV     TOPICAL ANESTHETIC:  Exactacain Spray     DESCRIPTION OF PROCEDURE:   After the risks benefits and     alternatives of the procedure were thoroughly explained, informed     consent was obtained.  The LB GIF-H180 [id number] endoscope was     introduced through the mouth and advanced to the second portion of     the duodenum, without limitations.  The instrument was slowly     withdrawn as the mucosa was fully examined.     <<PROCEDUREIMAGES>>     Mild gastritis was found in the body of the stomach. It was     granular and erythematous. Biopsies of the antrum and body of the     stomach were obtained and sent to pathology.  Otherwise normal     stomach. The esophagus and gastroesophageal junction were     completely normal in appearance.  The duodenal bulb was normal in     appearance, as was the postbulbar duodenum. Retroflexed views     revealed no abnormalities. The scope was then withdrawn from the     patient and the procedure completed.           COMPLICATIONS:  None           ENDOSCOPIC IMPRESSION:     1) Mild gastritis in the body of the stomach           RECOMMENDATIONS:     1) Await pathology results     2) continue PPI           [name] T. Fuller Plan, MD, [name]           CC:  Ricard Dillon, MD           n.     Lorrin MaisPricilla Riffle. Richanda Darin at 12/01/2010 02:31 PM           Horatio Pel, [id number]  Note: An exclamation mark (!) indicates a result that was not dispersed into the flowsheet. Document Creation Date: 12/01/2010 2:32 PM _______________________________________________________________________  (1) Order result status: Final Collection or observation date-time: 12/01/2010 14:25 Requested date-time:  Receipt date-time:  Reported date-time:  Referring Physician:   Ordering Physician: Lucio Edward [phone]) Specimen Source:  Source: Tawanna Cooler Order Number: [id number] Lab site:

## 2010-12-15 ENCOUNTER — Telehealth: Payer: Self-pay | Admitting: Gastroenterology

## 2010-12-16 NOTE — Letter (Signed)
Summary: Patient Notice- Colon Biospy Results  [facility]  [address] [place], [place] [address]   Phone: [phone]  Fax: [phone]        December 06, 2010 MRN: [id number]    [place], [place]  [address]    Dear Mr. Pall,  I am pleased to inform you that the biopsies taken during your recent colonoscopy did not show any evidence of cancer upon pathologic examination. The biopsies were all normal without any evidence of colitis.  Continue with the treatment plan as outlined on the day of your      exam.  You should have a repeat colonoscopy examination for this problem           in 3 years.  Please call us if you are having persistent problems or have questions about your condition that have not been fully answered at this time.  Sincerely,  Ladene Artist MD [facility]  This letter has been electronically signed by your physician.

## 2010-12-16 NOTE — Letter (Signed)
Summary: Patient [facility] Biopsy Results  [facility]  [place], [place] [address]   Phone: [phone]  Fax: [phone]        December 06, 2010 MRN: [id number]    [place], [place]  [address]    Dear Mr. Kohlmann,  I am pleased to inform you that the biopsies taken during your recent endoscopic examination did not show any evidence of cancer upon pathologic examination. The biopsies revealed mild chronic gastritis.  Continue with the treatment plan as outlined on the day of your      exam.  Please call us if you are having persistent problems or have questions about your condition that have not been fully answered at this time.  Sincerely,  Ladene Artist MD [facility]  This letter has been electronically signed by your physician.  Appended Document: Patient Notice-Endo Biopsy Results letter mailed

## 2010-12-21 NOTE — Progress Notes (Signed)
Summary: Biopsy results  Medications Added OMEPRAZOLE 40 MG CPDR (OMEPRAZOLE) one tablet by mouth every morning 30 mins before breakfast GLYCOPYRROLATE 2 MG TABS (GLYCOPYRROLATE) one tablet by mouth two times a day       Phone Note Call from Patient Call back at Home Phone [phone]   Caller: Patient Call For: Dr. Fuller Plan Reason for Call: Lab or Test Results Summary of Call: Calling about his biopsy results Initial call taken by: Webb Laws,  December 15, 2010 8:32 AM  Follow-up for Phone Call        Left message for patient  to call back. Follow-up by: Marlon Pel CMA Deborra Medina),  December 15, 2010 8:47 AM  Additional Follow-up for Phone Call Additional follow up Details #1::        Pt notified of biopsy results. Pt states he has been on  the Prilosec for several months with no improvement in his symptoms. Pt still has intermittant abd pain every day. I asked pt to reintensify anti-reflux measures and stay on Prilosec but he wants to know if he should continue that PPI or increase the dose possibly. Please advise. Additional Follow-up by: Marlon Pel CMA Deborra Medina),  December 15, 2010 9:30 AM    Additional Follow-up for Phone Call Additional follow up Details #2::    Increase omeprazole to 80m by mouth qam and start glycopyrrolate 232mby mouth bid Follow-up by: MaLadene ArtistD FAMarval Regal December 15, 2010 10:06 AM  Additional Follow-up for Phone Call Additional follow up Details #3:: Details for Additional Follow-up Action Taken: Pt notified and Rx was sent to pts pharmacy. Pt told to call back if his symptoms do not resolve with these 2 medications. Pt agreed. Additional Follow-up by: AmMarlon PelMA (AANew [place]  December 15, 2010 10:22 AM  New/Updated Medications: OMEPRAZOLE 40 MG CPDR (OMEPRAZOLE) one tablet by mouth every morning 30 mins before breakfast GLYCOPYRROLATE 2 MG TABS (GLYCOPYRROLATE) one tablet by mouth two times a day Prescriptions: GLYCOPYRROLATE 2 MG TABS  (GLYCOPYRROLATE) one tablet by mouth two times a day  #60 x 11   Entered by:   AmMarlon PelMA (AABuff[place]  Authorized by:   MaLadene ArtistD FARobert [facility] Signed by:   AmMarlon PelMA (AAShawneeon 12/15/2010   Method used:   Electronically to        [facility]       [address]      [place]  [address]     Ph: [phone]     Fax: [phone] RxID:   [id number] 40 MG CPDR (OMEPRAZOLE) one tablet by mouth every morning 30 mins before breakfast  #30 x 11   Entered by:   AmMarlon PelMA (AAValle [place]  Authorized by:   MaLadene ArtistD FAM[facility] Signed by:   AmMarlon PelMA (AASouth [place]on 12/15/2010   Method used:   Electronically to        [facility]       [address]      [place]  [address]     Ph: [phone]     Fax: [phone] [facility]  [phone]

## 2010-12-31 ENCOUNTER — Other Ambulatory Visit: Payer: Self-pay | Admitting: Internal Medicine

## 2011-01-10 ENCOUNTER — Telehealth: Payer: Self-pay | Admitting: Gastroenterology

## 2011-01-10 DIAGNOSIS — R1011 Right upper quadrant pain: Secondary | ICD-10-CM

## 2011-01-10 NOTE — Telephone Encounter (Signed)
Notified pt per Dr Fuller Plan he needs labs and ABD U/S. Pt scheduled for labs at his convenience and his U/S is 01/13/11 @ 0730am. F/U appt 01/24/11 @ 1045am.

## 2011-01-10 NOTE — Telephone Encounter (Signed)
Schedule abd U/S, BMET, LFTs, lipase, CRP, ESR and then office visit with me.

## 2011-01-10 NOTE — Telephone Encounter (Signed)
Last OV 12/15/10 with c/o intermittent abdominal pain daily;hx of UC. ECL on 12/01/10  With gastritis and normal COLON except internal hemorrhoids. His Prilosec was increased to 53m daily and he was started on Robinul 267mbid. The pain is in the right upper quadrant and sometimes moves from under the r breast to midline. Today, pt reports he has not had improvement in his symptoms. For several nights, the pain woke him up in the middle of the night and his pain was worse over the weekend. Pt reports he had [place] for this problem, which seems to recur every now and then[place], but responded for meds for Peptic Ulcer Disease- Flagyl, Cipro and [place]- and he improved in 5-6 days.12/01/10 . Pt wants to know if you can suggest something else or should he f/u with Dr JeArnoldo MoraleThanks.

## 2011-01-11 ENCOUNTER — Other Ambulatory Visit: Payer: Self-pay | Admitting: Gastroenterology

## 2011-01-11 ENCOUNTER — Other Ambulatory Visit (INDEPENDENT_AMBULATORY_CARE_PROVIDER_SITE_OTHER): Payer: BC Managed Care – PPO

## 2011-01-11 DIAGNOSIS — R1011 Right upper quadrant pain: Secondary | ICD-10-CM

## 2011-01-12 LAB — BASIC METABOLIC PANEL
BUN: 12 mg/dL (ref 6–23)
CO2: 30 mEq/L (ref 19–32)
Chloride: 103 mEq/L (ref 96–112)
Creatinine, Ser: 1.1 mg/dL (ref 0.4–1.5)
GFR: 72.8 mL/min (ref >60.00)
Glucose, Bld: 73 mg/dL (ref 70–99)
Potassium: 4.2 mEq/L (ref 3.5–5.1)
Sodium: 141 mEq/L (ref 135–145)

## 2011-01-12 LAB — LIPASE: Lipase: 12 U/L (ref 11.0–59.0)

## 2011-01-12 LAB — HEPATIC FUNCTION PANEL
ALT: 10 U/L (ref 0–53)
AST: 15 U/L (ref 0–37)
Albumin: 4.4 g/dL (ref 3.5–5.2)
Alkaline Phosphatase: 37 U/L — ABNORMAL LOW (ref 39–117)
Total Bilirubin: 1.5 mg/dL — ABNORMAL HIGH (ref 0.3–1.2)

## 2011-01-13 ENCOUNTER — Ambulatory Visit (HOSPITAL_COMMUNITY)
Admission: RE | Admit: 2011-01-13 | Discharge: 2011-01-13 | Disposition: A | Discharge status: Home / Self Care | Payer: BC Managed Care – PPO | Source: Ambulatory Visit | Attending: Gastroenterology | Admitting: Gastroenterology

## 2011-01-13 DIAGNOSIS — R1011 Right upper quadrant pain: Secondary | ICD-10-CM

## 2011-01-13 DIAGNOSIS — D739 Disease of spleen, unspecified: Secondary | ICD-10-CM | POA: Insufficient documentation

## 2011-01-24 ENCOUNTER — Encounter: Payer: Self-pay | Admitting: Gastroenterology

## 2011-01-24 ENCOUNTER — Ambulatory Visit (INDEPENDENT_AMBULATORY_CARE_PROVIDER_SITE_OTHER): Payer: BC Managed Care – PPO | Admitting: Gastroenterology

## 2011-01-24 VITALS — BP 110/70 | HR 72 | Ht 70.0 in | Wt 162.4 lb

## 2011-01-24 DIAGNOSIS — R1011 Right upper quadrant pain: Secondary | ICD-10-CM

## 2011-01-24 DIAGNOSIS — K5190 Ulcerative colitis, unspecified, without complications: Secondary | ICD-10-CM

## 2011-01-24 MED ORDER — DEXLANSOPRAZOLE 60 MG PO CPDR: 60.0000 mg | DELAYED_RELEASE_CAPSULE | Freq: Every day | ORAL | Status: AC

## 2011-01-24 NOTE — Progress Notes (Signed)
History of Present Illness: This is a 50 year old man with mild intermittent RUQ pain. Mild chronic gastritis on recent EGD. Normal recent colonoscopy, incldding negative colonic biopsies for UC surveillance. Recent abd [place] was negative. Symptoms have not improved with Robinul and omeprazole 40 mg daily. No clear association with meals. He notes alternating constipation with normal bowel movements. He had plans for extensive travel in [place] beginning in mid May.  Current Medications, Allergies, Past Medical History, Past Surgical History, Family History and Social History were reviewed in [employer] record.  Physical Exam: General: Well developed , well nourished, no acute distress Head: Normocephalic and atraumatic Eyes:  sclerae anicteric, EOMI Ears: Normal auditory acuity Mouth: No deformity or lesions Lungs: Clear throughout to auscultation Heart: Regular rate and rhythm; no murmurs, rubs or bruits Abdomen: Soft, minimal right upper quadrant tenderness, without rebound or guarding and non distended. No masses, hepatosplenomegaly or hernias noted. Normal Bowel sounds Musculoskeletal: Symmetrical with no gross deformities  Pulses:  Normal pulses noted Extremities: No clubbing, cyanosis, edema or deformities noted Neurological: Alert oriented x 4, grossly nonfocal Psychological:  Alert and cooperative. Normal mood and affect  Assessment and Recommendations:  1. Mild intermittent right upper quadrant pain. Possible acid related symptoms with known gastritis. Possible irritable bowel related especially given variable bowel habits. Discontinue omeprazole and begin Dexilant 60 mg daily. If his symptoms do not improve after 2 weeks change to a different anti-spasmodic.  2. Ulcerative colitis. Continue Colazal at current dosage. Recent colonoscopy was unremarkable including negative biopsies. Surveillance colonoscopy 11/2013.  3. Irritable bowel syndrome. Continue  glycopyrrolate. See problem #1.

## 2011-01-24 NOTE — Patient Instructions (Signed)
Stop Omeprazole and start Dexilant 60m one tablet by mouth once a day x 2 weeks and [place] 2 [place] with a update on your symptoms.

## 2011-02-07 ENCOUNTER — Telehealth: Payer: Self-pay | Admitting: Gastroenterology

## 2011-02-07 MED ORDER — DICYCLOMINE HCL 10 MG PO CAPS: ORAL_CAPSULE | ORAL | Status: DC

## 2011-02-07 NOTE — Telephone Encounter (Signed)
Patient is advised of Dr Lynne Leader recommendations.  He has tried hyoscyamine in the past he doesn't remember if it worked.  I will send in the bentyl.

## 2011-02-07 NOTE — Telephone Encounter (Signed)
Can DC Dexilant and return to prior PPI. IBS flare could be related to DC of doxepin Trial of Levbid bid or Bentyl 10 mg tid

## 2011-02-07 NOTE — Telephone Encounter (Signed)
He called to report that his symptoms have not improved to maybe a little worse since starting on Dexilant.  He is still seeing blood in his stool.  He wanted you to know that his symptoms started with in a few months of stopping his doxepin that he was on for 14 years.  He is wondering if some of this was IBS.  Per your last office note you were going to try another anti-spasmodic if no improvement.  He is also asking if he needs to continue [place] he feels it has made his symptoms worse.

## 2011-02-14 ENCOUNTER — Other Ambulatory Visit: Payer: Self-pay | Admitting: Internal Medicine

## 2011-02-22 NOTE — Assessment & Plan Note (Signed)
[place]                                 ON-CALL NOTE   NAME:Hehn, Faizon                        MRN:          [id number]  DATE:04/03/2007                            DOB:          01-28-1961    On call message about 2 a.m. on June 24.  Phone number [phone].  Is a  [name] patient and complaining of a bad migraine. He gets migraines, he  gets one this bad every couple of years and so far he has taken in the  last 5 to 6 hours Imitrex 100 mg 2 times, 2 Tylenol and then an hour ago  some Darvocet 100/650.  He did not want to take any inflammatories  because he has ulcerative colitis and this week he is having a flareup  with some rectal bleeding, on Proctocort 3 times a day. Has no  associated fever or other unusual aspect, although  his headache is one  of his more painful ones.  There are no other associated symptoms of  concern.  From what he says he has had 1650 of Tylenol and he has some  leftover hydrocodone 5/500 or should he take another Darvocet.  I  discussed with him limits of the Tylenol, but he could take 1 of the  hydrocodone 5/500 if he is not getting relief or continuing he will need  to be seen in the emergency room and/or otherwise follow up with Dr.  Arnoldo Hawkins.     Standley Brooking. Panosh, MD  Electronically Signed    WKP/MedQ  DD: 04/03/2007  DT: 04/03/2007  Job #: [id number]

## 2011-02-22 NOTE — Assessment & Plan Note (Signed)
[place] OFFICE NOTE   NAME:[name], [name]                        MRN:          [id number]  DATE:05/03/2007                            DOB:          [birth year]    [name] reports complete relief of symptoms since using  hydrocortisone enemas alternating with Mesalamine enemas. He has tapered  off the hydrocortisone enemas and is using Mesalamine enemas every other  day at this point. He remains on Colazal twice a day. He states that he  is having 1 to 2 well formed bowel movements a day and he occasionally  notes a small amount of mucus in the bowel movements. He has noted  abdominal discomfort and loose stools with butter fat, so he is avoiding  it at this point. He was also advised by Dr. Arnoldo Morale to loose weight for  a borderline elevated cholesterol and he has lost about 15 pounds. He  states that he would like to stabilize his weight loss at this point,  but he feels that he is continuing to loose.   CURRENT MEDICATIONS:  Listed on the chart, updated and reviewed.   MEDICATION ALLERGIES:  None known.   PHYSICAL EXAMINATION:  GENERAL:  No acute distress.  VITAL SIGNS:  Weight 162.4 pounds, blood pressure 108/74, pulse 76 and  regular.  CHEST:  Clear to auscultation bilaterally.  CARDIAC:  Regular rate and rhythm without murmurs.  ABDOMEN:  Soft and nontender with normoactive bowel sounds.   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 Continue Colazal 750 mg 3 p.o. b.i.d. Continue      Mesalamine enemas every other day for one week and then taper to      every third day for one week, and then discontinue. Return office      visit in one year.  2. Weight loss. This appears to be related to substantial dietary      changes. I have asked him to slightly increase his calorie intake      and follow up with Dr. Arnoldo Morale.     Pricilla Riffle. Fuller Plan, MD, [facility]  Electronically Signed    MTS/MedQ  DD: 05/06/2007  DT: 05/07/2007  Job #: [id number]   cc:   Ricard Dillon, MD

## 2011-02-25 NOTE — Assessment & Plan Note (Signed)
[place] OFFICE NOTE   NAME:Kirschenbaum, Sloan                        MRN:          [id number]  DATE:04/04/2007                            DOB:          1961-01-29    CHIEF COMPLAINT:  Colitis flare.   HISTORY:  Marsha is a pleasant 50 year old white male known to Dr. Fuller Plan  and also followed by Dr. Arnoldo Morale for ulcerative  proctitis/proctosigmoiditis.  He was last seen in the office in November  of 2006, and has been seen by Dr. Arnoldo Morale in the interim.  His last  colonoscopy was done January of 2007, showing internal hemorrhoids which  were sclerosed with saline and evidence of proctitis.  Random  biopsies  were taken in the ascending colon and hepatic flexure as well as sigmoid  and these did not show any evidence of active colitis.   The patient has been maintained on Colazal long term, generally b.i.d.  He says over the past 2-1/2 to 3 weeks he has noticed increased mucus in  his stool and small amounts of blood with his bowel movements.  He says  that he seems to be having episodes of constipation for 3-4 days  followed by a day of several loose bowel movements, which has been his  pattern, especially when his disease is flaring.  He has seen Dr.  Arnoldo Morale about a week ago who placed him on Proctocort suppositories  t.i.d. and increased his Colazal to 2.25g t.i.d.  He says over the past  couple of days he has not noticed any further bleeding and continues to  have some crampiness and increased mucus in his stool.  He has not had a  bowel movement today.  Of note, he had been on a recent diet and had  lost about 18 pounds and wonders if this may have affected his colitis.   CURRENT MEDS:  1. Colazal 750 mg three p.o. t.i.d.  2. Claritin 10 mg daily.  3. Fiber supplement daily.  4. Doxepin 50 daily.  5. Benadryl p.r.n.  6. Proctocort currently 30 mg t.i.d.  7. He uses Imitrex and ibuprofen, tramadol p.r.n.  for migraines.   ALLERGIES:  No known drug allergies.   EXAM:  A well-developed thin white male in no acute distress.  Weight is  167, blood pressure 118/70, pulse is 76.  HEENT:  Nontraumatic, normocephalic, EOMI, PERLA, sclera anicteric.  CARDIOVASCULAR:  Regular rate and rhythm with S1 and S2.  ABDOMEN:  Soft, bowel sounds are active.  He is minimally tender in the  left lower quadrant.  There is no guarding or rebound, no mass or  hepatosplenomegaly.  RECTAL EXAM:  Not done today.   IMPRESSION:  A 50 year old white male with known ulcerative  proctosigmoiditis with mild exacerbation.   PLAN:  1. Continue increased dose of Colazal 750 mg three tablets p.o. t.i.d.  2. Change Proctocort to one p.o. b.i.d.  3. Add Cortenema q.h.s., alternating with Rowasa enema q.h.s. for a 2      week period and then decrease enema use to every other night,  alternating the Rowasa and Cortenemas.  4. Return office visit with Dr. Fuller Plan in approximately 4 weeks or      sooner p.r.n.      Nicoletta Ba, PA-C  Electronically Signed      Pricilla Riffle. Fuller Plan, MD, [facility]  Electronically Signed   AE/MedQ  DD: 04/05/2007  DT: 04/05/2007  Job #: [id number]

## 2011-04-11 ENCOUNTER — Telehealth: Payer: Self-pay | Admitting: Internal Medicine

## 2011-04-11 NOTE — Telephone Encounter (Signed)
Anusol HC cream use every 6 hours when necessary pain. Office visit in 48 hours if unimproved

## 2011-04-11 NOTE — Telephone Encounter (Signed)
Pt called. Requests call back about treatment for hemorrhoids. States has been using OTC med x6 days. Wants to know if he needs an appt in this office or somewhere else. Please call.

## 2011-04-12 ENCOUNTER — Ambulatory Visit (INDEPENDENT_AMBULATORY_CARE_PROVIDER_SITE_OTHER): Payer: BC Managed Care – PPO | Admitting: Family Medicine

## 2011-04-12 ENCOUNTER — Encounter: Payer: Self-pay | Admitting: Family Medicine

## 2011-04-12 VITALS — BP 114/84 | HR 70 | Temp 98.3°F | Wt 164.0 lb

## 2011-04-12 DIAGNOSIS — K645 Perianal venous thrombosis: Secondary | ICD-10-CM

## 2011-04-12 NOTE — Telephone Encounter (Signed)
Pt has hemorrhoids and is leaving to go overseas Thursday.  No steroid meds have worked, and appt t see Dr. Sarajane Jews this am.

## 2011-04-12 NOTE — Telephone Encounter (Signed)
Pt called again. Checking on status.

## 2011-04-12 NOTE — Progress Notes (Signed)
  Subjective:    Patient ID: [name], male    DOB: Aug 01, 1961, 50 y.o.   MRN: [id number]  HPI Here for a painful hemorrhoid that has been present for several weeks. It bleeds slightly at times. Using hydrocortisone cream on it.    Review of Systems  Constitutional: Negative.   Gastrointestinal: Positive for blood in stool and rectal pain. Negative for abdominal pain.       Objective:   Physical Exam  Constitutional: He appears well-developed and well-nourished.  Genitourinary:       Small thrombosed hemorrhoid at the anal verge.           Assessment & Plan:  The hemorrhoid was lanced with a scalpel, and the contents were removed. He will go home and soak in a hot bath. Recommended he try Miralax daily to keep the stools soft

## 2011-05-07 ENCOUNTER — Other Ambulatory Visit: Payer: Self-pay | Admitting: Internal Medicine

## 2011-05-17 ENCOUNTER — Other Ambulatory Visit (INDEPENDENT_AMBULATORY_CARE_PROVIDER_SITE_OTHER): Payer: BC Managed Care – PPO

## 2011-05-17 DIAGNOSIS — Z0000 Encounter for general adult medical examination without abnormal findings: Secondary | ICD-10-CM

## 2011-05-17 LAB — CBC WITH DIFFERENTIAL/PLATELET
Basophils Absolute: 0 10*3/uL (ref 0.0–0.1)
Eosinophils Absolute: 0 10*3/uL (ref 0.0–0.7)
Hemoglobin: 15.1 g/dL (ref 13.0–17.0)
Lymphocytes Relative: 26.5 % (ref 12.0–46.0)
MCHC: 34.2 g/dL (ref 30.0–36.0)
Monocytes Relative: 9.9 % (ref 3.0–12.0)
Neutrophils Relative %: 62.3 % (ref 43.0–77.0)
RBC: 5.06 Mil/uL (ref 4.22–5.81)
RDW: 13.5 % (ref 11.5–14.6)

## 2011-05-17 LAB — BASIC METABOLIC PANEL
CO2: 29 mEq/L (ref 19–32)
Calcium: 9.4 mg/dL (ref 8.4–10.5)
Creatinine, Ser: 1.1 mg/dL (ref 0.4–1.5)
GFR: 75.76 mL/min (ref >60.00)
Glucose, Bld: 92 mg/dL (ref 70–99)
Sodium: 143 mEq/L (ref 135–145)

## 2011-05-17 LAB — HEPATIC FUNCTION PANEL
ALT: 11 U/L (ref 0–53)
AST: 13 U/L (ref 0–37)
Albumin: 4.7 g/dL (ref 3.5–5.2)

## 2011-05-17 LAB — LIPID PANEL: Cholesterol: 171 mg/dL (ref 0–200)

## 2011-05-17 LAB — POCT URINALYSIS DIPSTICK
Bilirubin, UA: NEGATIVE
Glucose, UA: NEGATIVE
Spec Grav, UA: NEGATIVE

## 2011-05-17 LAB — TSH: TSH: 0.91 u[IU]/mL (ref 0.35–5.50)

## 2011-05-20 ENCOUNTER — Ambulatory Visit (INDEPENDENT_AMBULATORY_CARE_PROVIDER_SITE_OTHER): Payer: BC Managed Care – PPO | Admitting: Internal Medicine

## 2011-05-20 ENCOUNTER — Encounter: Payer: Self-pay | Admitting: Internal Medicine

## 2011-05-20 VITALS — BP 124/70 | HR 68 | Temp 98.2°F | Resp 14 | Ht 69.0 in | Wt 163.0 lb

## 2011-05-20 DIAGNOSIS — H1010 Acute atopic conjunctivitis, unspecified eye: Secondary | ICD-10-CM

## 2011-05-20 DIAGNOSIS — K529 Noninfective gastroenteritis and colitis, unspecified: Secondary | ICD-10-CM

## 2011-05-20 DIAGNOSIS — M7918 Myalgia, other site: Secondary | ICD-10-CM

## 2011-05-20 DIAGNOSIS — Z0000 Encounter for general adult medical examination without abnormal findings: Secondary | ICD-10-CM

## 2011-05-20 DIAGNOSIS — IMO0001 Reserved for inherently not codable concepts without codable children: Secondary | ICD-10-CM

## 2011-05-20 DIAGNOSIS — L210 Seborrhea capitis: Secondary | ICD-10-CM

## 2011-05-20 DIAGNOSIS — R1013 Epigastric pain: Secondary | ICD-10-CM

## 2011-05-20 MED ORDER — OMEGA-3 KRILL OIL 300 MG PO CAPS: 3.0000 | ORAL_CAPSULE | Freq: Every day | ORAL | Status: DC

## 2011-05-20 MED ORDER — DIPHENHYDRAMINE HCL (SLEEP) 25 MG PO TBDP: 1.0000 | ORAL_TABLET | Freq: Every day | ORAL | Status: DC

## 2011-05-20 MED ORDER — LORATADINE 10 MG PO TABS: 10.0000 mg | ORAL_TABLET | Freq: Every day | ORAL | Status: DC

## 2011-05-20 MED ORDER — FLUTICASONE PROPIONATE 50 MCG/ACT NA SUSP: 1.0000 | Freq: Two times a day (BID) | NASAL | Status: DC

## 2011-05-20 MED ORDER — BALSALAZIDE DISODIUM 750 MG PO CAPS: 2250.0000 mg | ORAL_CAPSULE | Freq: Three times a day (TID) | ORAL | Status: DC

## 2011-05-20 MED ORDER — MESALAMINE 4 G RE ENEM: 4.0000 g | ENEMA | Freq: Every day | RECTAL | Status: DC

## 2011-05-20 MED ORDER — MESALAMINE 1000 MG RE SUPP: 1000.0000 mg | Freq: Two times a day (BID) | RECTAL | Status: DC

## 2011-05-20 MED ORDER — CLOBETASOL PROPIONATE 0.05 % EX FOAM: Freq: Every day | CUTANEOUS | Status: DC | PRN

## 2011-05-20 NOTE — Progress Notes (Signed)
Addended by: Ricard Dillon MD E on: 05/20/2011 03:42 PM   Modules accepted: Orders, Level of Service

## 2011-05-20 NOTE — Progress Notes (Signed)
  Subjective:    Patient ID: [name], male    DOB: Dec 27, 1960, 50 y.o.   MRN: [id number]  HPI CPX    Review of Systems     Objective:   Physical Exam        Assessment & Plan:

## 2011-05-20 NOTE — Progress Notes (Signed)
Subjective:    Patient ID: [name], male    DOB: [birth year], 50 y.o.   MRN: [id number]  HPI Medial collateral ligament pain Bunion Flat feet Increased PNDrip on Claritin Increased abdomin pain and moderate constipation with some [place] for colitis  Increased right upper abdomin pain  Endoscopy showed GERD and started PPI Added bentyl   CPX    Review of Systems  Constitutional: Negative for fever and fatigue.  HENT: Negative for hearing loss, congestion, neck pain and postnasal drip.   Eyes: Negative for discharge, redness and visual disturbance.  Respiratory: Negative for cough, shortness of breath and wheezing.   Cardiovascular: Negative for leg swelling.  Gastrointestinal: Negative for abdominal pain, constipation and abdominal distention.  Genitourinary: Negative for urgency and frequency.  Musculoskeletal: Negative for joint swelling and arthralgias.  Skin: Negative for color change and rash.  Neurological: Negative for weakness and light-headedness.  Hematological: Negative for adenopathy.  Psychiatric/Behavioral: Negative for behavioral problems.   Past Medical History  Diagnosis Date  . Allergic rhinitis   . Migraines   . GERD (gastroesophageal reflux disease)   . Ulcerative proctosigmoiditis 2001  . Internal hemorrhoids    Past Surgical History  Procedure Date  . Eye muscle transplant     Bilateral    reports that he has never smoked. He has never used smokeless tobacco. He reports that he drinks alcohol. He reports that he does not use illicit drugs. family history includes Arthritis in his mother; Colon cancer in an unspecified family member; Colon polyps in his maternal grandmother; Crohn's disease in his cousin and paternal aunt; Heart disease in his brother and father; and Thyroid disease in his father. Allergies  Allergen Reactions  . Codeine Nausea Only       Objective:   Physical Exam  Constitutional: He is oriented to person, place,  and time. He appears well-developed and well-nourished.  HENT:  Head: Normocephalic and atraumatic.  Eyes: Conjunctivae are normal. Pupils are equal, round, and reactive to light.  Neck: Normal range of motion. Neck supple.  Cardiovascular: Normal rate and regular rhythm.   Pulmonary/Chest: Effort normal and breath sounds normal.  Abdominal: Soft. Bowel sounds are normal. There is tenderness.  Genitourinary: Rectum normal and prostate normal.  Musculoskeletal: Normal range of motion.  Neurological: He is alert and oriented to person, place, and time. He has normal reflexes.  Skin: Skin is warm and dry.  Psychiatric: He has a normal mood and affect. His behavior is normal.          Assessment & Plan:   Patient presents for yearly preventative medicine examination.   all immunizations and health maintenance protocols were reviewed with the patient and they are up to date with these protocols.   screening laboratory values were reviewed with the patient including screening of hyperlipidemia PSA renal function and hepatic function.   There medications past medical history social history problem list and allergies were reviewed in detail.   Goals were established with regard to weight loss exercise diet in compliance with medications  Patient has a history of colitis currently on following regimen prescribed by GI.  The patient has abdominal pain that believes abdominal wall pain we identified the area in the abdominal wall musculature and I have tried a point injection of Depo-Medrol and lidocaine to see if we can alleviate his symptoms.  I do not believe this is deep organ pain he has been thoroughly evaluated by GI for his liver his duodenum and  the stomach which are the organs in the  region

## 2011-07-18 ENCOUNTER — Other Ambulatory Visit: Payer: Self-pay | Admitting: *Deleted

## 2011-07-18 MED ORDER — SUMATRIPTAN SUCCINATE 100 MG PO TABS: 100.0000 mg | ORAL_TABLET | ORAL | Status: DC | PRN

## 2011-10-27 ENCOUNTER — Telehealth: Payer: Self-pay | Admitting: *Deleted

## 2011-10-27 MED ORDER — CYCLOBENZAPRINE HCL 5 MG PO TABS: 5.0000 mg | ORAL_TABLET | Freq: Three times a day (TID) | ORAL | Status: AC | PRN

## 2011-10-27 NOTE — Telephone Encounter (Signed)
Per Dr. Arnoldo Morale :  Naprosyn one po bid for 5 days, and Flexeril 5 mg. Tid   Notified pt.

## 2011-10-27 NOTE — Telephone Encounter (Addendum)
Pt. Is having right back pain that radiates to right leg, and some pain in hip.  What  are the best meds for him to take?  No injury.  Has Naprosxyn, and Oxycodone.

## 2011-11-09 ENCOUNTER — Telehealth: Payer: Self-pay | Admitting: Family Medicine

## 2011-11-09 NOTE — Telephone Encounter (Signed)
[facility] Triage Call Report Triage Record Num: [id number] Operator: Verneita Griffes Patient Name: [name] Call Date & Time: 11/09/2011 3:43:11AM Patient Phone: [phone] PCP: Benay Pillow Patient Gender: Male PCP Fax : [phone] Patient DOB: 1961-08-19 Practice Name: [facility] Reason for Call: Caller: Ante/Patient; PCP: Other; CB#: [phone]; Call Reason: Abd pain Pt reports he is currently in [place] and woke with pain left side of abd. Non tender, no increase with movement. No other symptoms Care advice per Abdominal pain Guideline SS for call back reviewed Protocol(s) Used: Abdominal Pain Recommended Outcome per Protocol: See Provider within 2 Weeks Reason for Outcome: Repeated episodes of abdominal discomfort AND no previous evaluation by provider Care Advice: Avoid foods that may be responsible for increased gas production (such as dairy products, raw vegetables, nuts, spicy or fried foods, etc.). ~ ~ Eliminate or decrease the intake of alcoholic or caffeinated beverages. ~ Pain medication or laxatives should not be taken until symptoms are evaluated. ~ Call provider if symptoms worsen or new symptoms develop. Do not take aspirin, ibuprofen, ketoprofen, naproxen, etc., or other pain relieving medications until consulting with provider. ~ ~ Call provider immediately if develop severe pain, nausea, vomiting, abdominal swelling or fever. ~ Eat smaller, more frequent meals; eat slowly and allow one-half hour to relax after eating. Consider nonprescription low-sodium antacids (i.e. Mylanta, Maalox, Tums, Gelusil), H-2-receptor blockers (Tagamet HB, Pepcid AC, Zantac), or a proton pump inhibitor (Prilosec) following package directions or provider instructions. ~ 11/09/2011 4:48:25AM Page 1 of 1 [email or web address]

## 2011-12-19 ENCOUNTER — Telehealth: Payer: Self-pay | Admitting: Internal Medicine

## 2011-12-19 DIAGNOSIS — K529 Noninfective gastroenteritis and colitis, unspecified: Secondary | ICD-10-CM

## 2011-12-19 MED ORDER — BALSALAZIDE DISODIUM 750 MG PO CAPS: 2250.0000 mg | ORAL_CAPSULE | Freq: Three times a day (TID) | ORAL | Status: DC

## 2011-12-19 NOTE — Telephone Encounter (Signed)
[facility] in [place] called and said that pt needs refill of balsalazide (COLAZAL) 750 MG capsule  Pls Fax refill to Fax # [phone]

## 2012-01-26 ENCOUNTER — Other Ambulatory Visit: Payer: Self-pay | Admitting: *Deleted

## 2012-01-26 MED ORDER — SUMATRIPTAN SUCCINATE 100 MG PO TABS: 100.0000 mg | ORAL_TABLET | ORAL | Status: DC | PRN

## 2012-03-23 ENCOUNTER — Ambulatory Visit: Payer: BC Managed Care – PPO | Admitting: Internal Medicine

## 2012-03-27 ENCOUNTER — Other Ambulatory Visit: Payer: Self-pay | Admitting: Internal Medicine

## 2012-03-30 ENCOUNTER — Ambulatory Visit: Payer: BC Managed Care – PPO | Admitting: Internal Medicine

## 2012-04-09 ENCOUNTER — Ambulatory Visit: Payer: BC Managed Care – PPO | Admitting: Internal Medicine

## 2012-04-10 ENCOUNTER — Ambulatory Visit (INDEPENDENT_AMBULATORY_CARE_PROVIDER_SITE_OTHER)
Admission: RE | Admit: 2012-04-10 | Discharge: 2012-04-10 | Disposition: A | Discharge status: Home / Self Care | Payer: BC Managed Care – PPO | Source: Ambulatory Visit | Attending: Internal Medicine | Admitting: Internal Medicine

## 2012-04-10 ENCOUNTER — Ambulatory Visit (INDEPENDENT_AMBULATORY_CARE_PROVIDER_SITE_OTHER): Payer: BC Managed Care – PPO | Admitting: Internal Medicine

## 2012-04-10 ENCOUNTER — Encounter: Payer: Self-pay | Admitting: Internal Medicine

## 2012-04-10 VITALS — BP 130/80 | HR 72 | Temp 98.2°F | Resp 16 | Ht 69.0 in | Wt 166.0 lb

## 2012-04-10 DIAGNOSIS — M545 Low back pain, unspecified: Secondary | ICD-10-CM

## 2012-04-10 DIAGNOSIS — K219 Gastro-esophageal reflux disease without esophagitis: Secondary | ICD-10-CM

## 2012-04-10 DIAGNOSIS — Z0000 Encounter for general adult medical examination without abnormal findings: Secondary | ICD-10-CM

## 2012-04-10 DIAGNOSIS — K529 Noninfective gastroenteritis and colitis, unspecified: Secondary | ICD-10-CM

## 2012-04-10 DIAGNOSIS — G4700 Insomnia, unspecified: Secondary | ICD-10-CM

## 2012-04-10 DIAGNOSIS — K5289 Other specified noninfective gastroenteritis and colitis: Secondary | ICD-10-CM

## 2012-04-10 DIAGNOSIS — K5190 Ulcerative colitis, unspecified, without complications: Secondary | ICD-10-CM

## 2012-04-10 DIAGNOSIS — J309 Allergic rhinitis, unspecified: Secondary | ICD-10-CM

## 2012-04-10 MED ORDER — DOXEPIN HCL 10 MG PO CAPS: 10.0000 mg | ORAL_CAPSULE | Freq: Every day | ORAL | Status: DC

## 2012-04-10 MED ORDER — BALSALAZIDE DISODIUM 750 MG PO CAPS: 2250.0000 mg | ORAL_CAPSULE | Freq: Three times a day (TID) | ORAL | Status: DC

## 2012-04-10 MED ORDER — OMEGA-3 KRILL OIL 300 MG PO CAPS: 3.0000 | ORAL_CAPSULE | Freq: Every day | ORAL | Status: DC

## 2012-04-10 NOTE — Patient Instructions (Signed)
The patient is instructed to continue all medications as prescribed. Schedule followup with check out clerk upon leaving the clinic  

## 2012-04-10 NOTE — Progress Notes (Signed)
Subjective:    Patient ID: [name], male    DOB: [birth year], 51 y.o.   MRN: [id number]  HPI Pt presents for follow up of chronic medical problems Chief complaints of persistent insomnia ( awakening and difficulty falling back to sleep) No reported snoring. No excessive daytime somnolence The patient's back pain has not recurred, has been doing stretching and exorcize. 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    Review of Systems  Constitutional: Negative for fever and fatigue.  HENT: Negative for hearing loss, congestion, neck pain and postnasal drip.   Eyes: Negative for discharge, redness and visual disturbance.  Respiratory: Negative for cough, shortness of breath and wheezing.   Cardiovascular: Negative for leg swelling.  Gastrointestinal: Negative for abdominal pain, constipation and abdominal distention.  Genitourinary: Negative for urgency and frequency.  Musculoskeletal: Negative for joint swelling and arthralgias.  Skin: Negative for color change and rash.  Neurological: Negative for weakness and light-headedness.  Hematological: Negative for adenopathy.  Psychiatric/Behavioral: Negative for behavioral problems.   Past Medical History  Diagnosis Date  . Allergic rhinitis   . Migraines   . GERD (gastroesophageal reflux disease)   . Ulcerative proctosigmoiditis 2001  . Internal hemorrhoids     History   Social History  . Marital Status: Single    Spouse Name: Domestic partner    Number of Children: N/A  . Years of Education: N/A   Occupational History  . College professor    Social History Main Topics  . Smoking status: Never Smoker   . Smokeless tobacco: Never Used  . Alcohol Use: 0.0 oz/week     1-2 a day  . Drug Use: No  . Sexually Active: Not on file   Other Topics Concern  . Not on file   Social History Narrative  . No narrative on file    Past Surgical History  Procedure Date  . Eye muscle transplant     Bilateral     Family History  Problem Relation Age of Onset  . Thyroid disease Father   . Arthritis Mother   . Colon cancer      Grandmother's Family (x 1 brother to grandmother and 1 sister to grandmother)  . Crohn's disease Paternal Aunt   . Crohn's disease Cousin   . Colon polyps Maternal Grandmother     Great Aunt, Great Uncle  . Heart disease Brother   . Heart disease Father     a fib, expanded aorta    Allergies  Allergen Reactions  . Codeine Nausea Only    Current Outpatient Prescriptions on File Prior to Visit  Medication Sig Dispense Refill  . balsalazide (COLAZAL) 750 MG capsule Take 3 capsules (2,250 mg total) by mouth 3 (three) times daily.  180 capsule  3  . clobetasol (OLUX) 0.05 % topical foam Apply topically daily as needed.  50 g  11  . dicyclomine (BENTYL) 10 MG capsule 1 po tid as needed for pain  90 capsule  6  . DiphenhydrAMINE HCl, Sleep, 25 MG TBDP Take 1 tablet (25 mg total) by mouth at bedtime.  30 each  6  . fluticasone (FLONASE) 50 MCG/ACT nasal spray USE ONE SPRAY IN EACH NOSTRIL TWICE DAILY  48 g  2  . ibuprofen (ADVIL,MOTRIN) 200 MG tablet Take 200 mg by mouth as needed.        . loratadine (CLARITIN) 10 MG tablet Take 1 tablet (10 mg total) by mouth daily.  48 tablet  6  .  mesalamine (CANASA) 1000 MG suppository Place 1 suppository (1,000 mg total) rectally 2 (two) times daily.  30 suppository  11  . mesalamine (ROWASA) 4 G enema Place 60 mLs (4 g total) rectally at bedtime.  20 Bottle  11  . OMEGA-3 KRILL OIL 300 MG CAPS Take 3 capsules (900 mg total) by mouth daily. Take 3 capsules by mouth 1 time daily  90 capsule  11  . omeprazole (PRILOSEC) 40 MG capsule Take 20 mg by mouth daily.       Marland Kitchen oxyCODONE-acetaminophen (PERCOCET) 5-325 MG per tablet Take 1 tablet by mouth every 6 (six) hours as needed.        . SUMAtriptan (IMITREX) 100 MG tablet Take 1 tablet (100 mg total) by mouth every 2 (two) hours as needed for migraine.  9 tablet  11  . Vitamin D,  Ergocalciferol, (DRISDOL) 50000 UNITS CAPS TAKE ONE CAPSULE BY MOUTH WEEKLY  15 capsule  2  . DISCONTD: glycopyrrolate (ROBINUL) 2 MG tablet Take 2 mg by mouth 2 (two) times daily.          There were no vitals taken for this visit.        Objective:   Physical Exam  Nursing note and vitals reviewed. Constitutional: He appears well-developed and well-nourished.  HENT:  Head: Normocephalic and atraumatic.  Eyes: Conjunctivae are normal. Pupils are equal, round, and reactive to light.  Neck: Normal range of motion. Neck supple.  Cardiovascular: Normal rate and regular rhythm.   Pulmonary/Chest: Effort normal and breath sounds normal.  Abdominal: Soft. Bowel sounds are normal.          Assessment & Plan:  Sleep disorder, was treated on doxypen and had better pattern Resume the doxypen

## 2012-07-03 ENCOUNTER — Telehealth: Payer: Self-pay | Admitting: Family Medicine

## 2012-07-03 MED ORDER — OXYCODONE-ACETAMINOPHEN 5-325 MG PO TABS: 1.0000 | ORAL_TABLET | Freq: Four times a day (QID) | ORAL | Status: DC | PRN

## 2012-07-03 NOTE — Telephone Encounter (Signed)
Pt states he has rx for oxyCODONE-acetaminophen (PERCOCET) 5-325 MG per tablet That he uses once in awhile for migraines. Last filled 11/26/2009. He would like a new rx. Please call when ready for pick up. He is asking if we can mail it. Please advise.

## 2012-07-03 NOTE — Telephone Encounter (Signed)
Pt will pick up script next week

## 2012-08-15 ENCOUNTER — Telehealth: Payer: Self-pay | Admitting: Internal Medicine

## 2012-08-15 MED ORDER — ATOVAQUONE-PROGUANIL HCL 250-100 MG PO TABS: ORAL_TABLET | ORAL | Status: DC

## 2012-08-15 NOTE — Telephone Encounter (Signed)
Caller: [name]/Patient; Phone: [phone]; Reason for Call: Patient called to follow-up on e-mail sent to Dr Arnoldo Morale regarding need for medications to be prescribed for patient and his partner to travel to [place] (leaving [place] on 11/21).  If there are any questions, [name] advised to referrence the e-mail that was sent to Dr Arnoldo Morale.

## 2012-08-15 NOTE — Telephone Encounter (Signed)
Requesting malarone Ok to send 38 per dr Arnoldo Morale

## 2012-08-29 IMAGING — CR DG LUMBAR SPINE COMPLETE 4+V
5 series · 5 of 5 positions shown · non-contrast
Comparison: None.

CLINICAL DATA: Recurrent back pain

LUMBAR SPINE - COMPLETE 4+ VIEW

[view not recorded (1 of 5)]
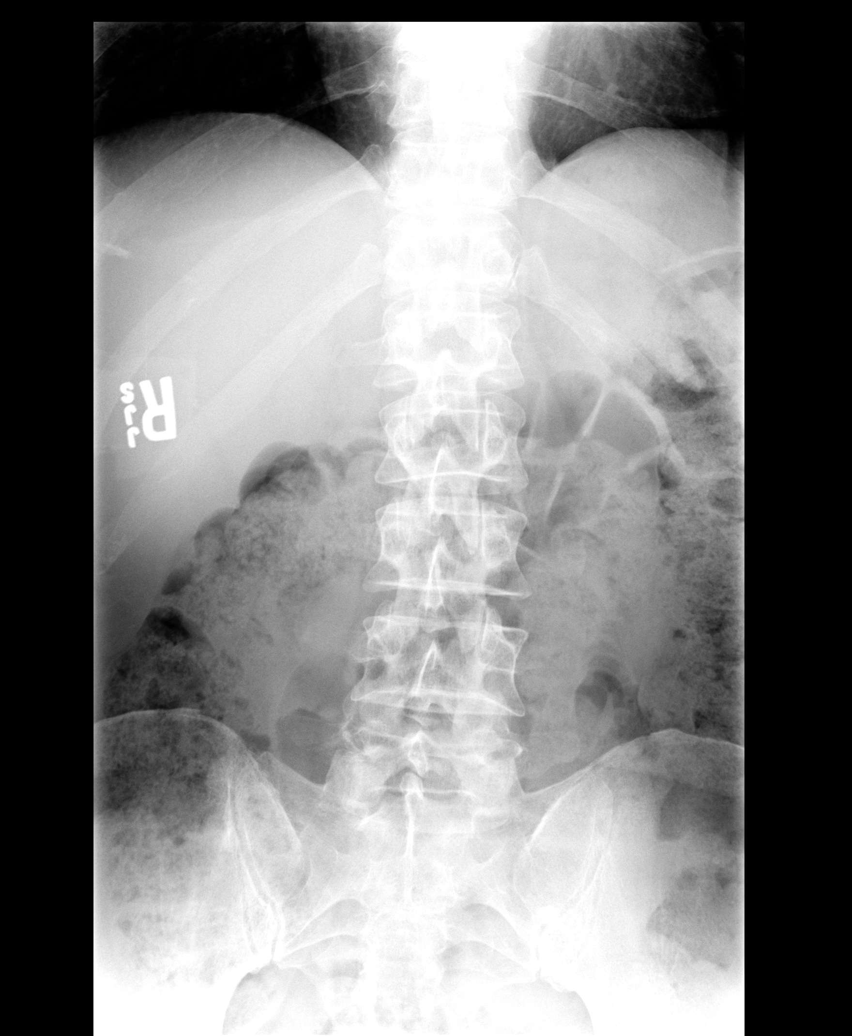

[view not recorded (2 of 5)]
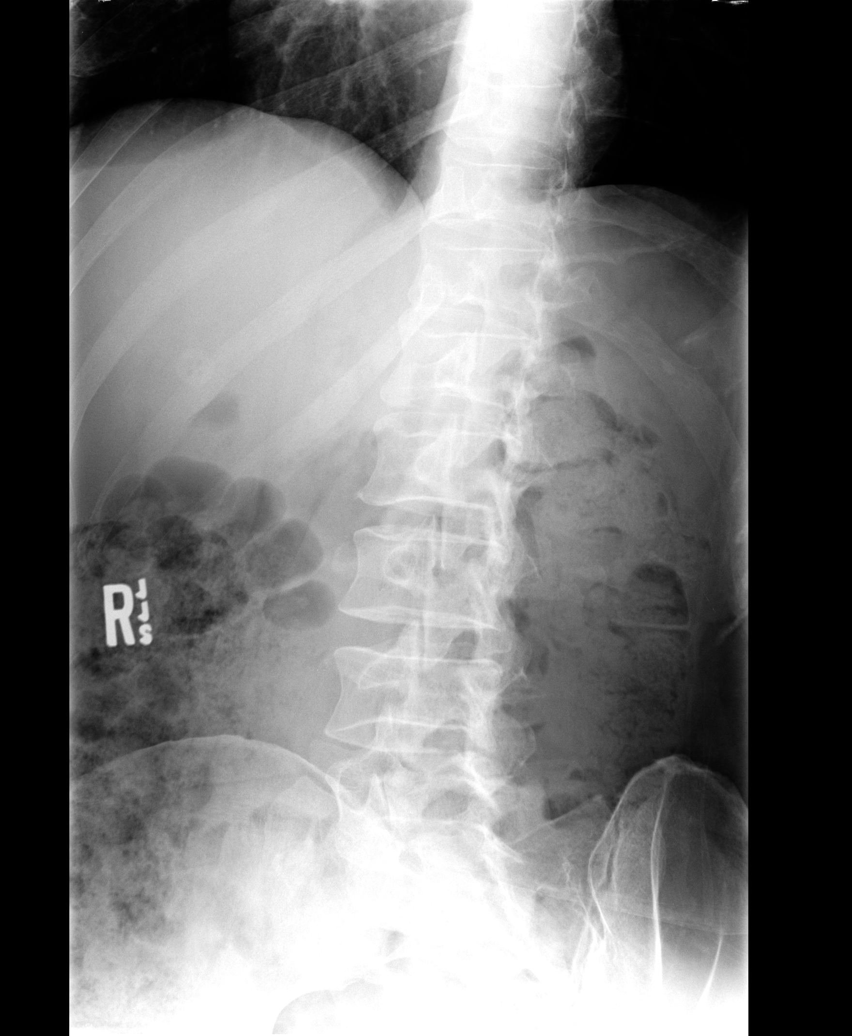

[view not recorded (3 of 5)]
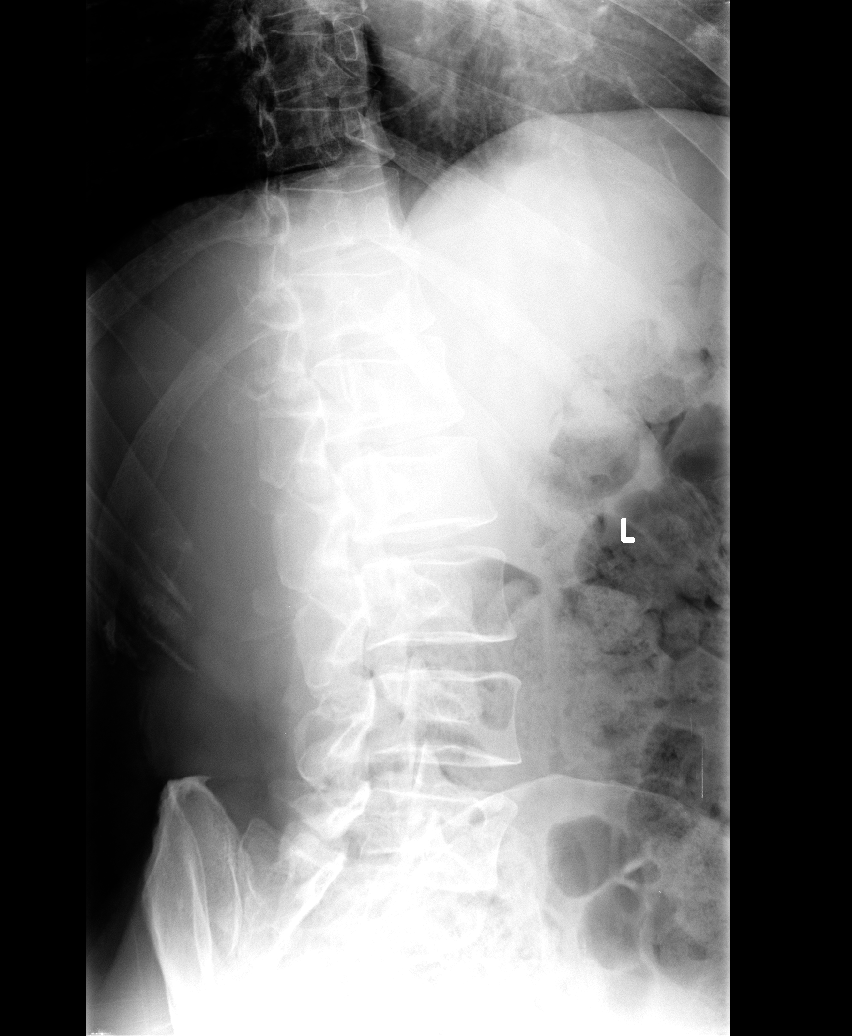

[view not recorded (4 of 5)]
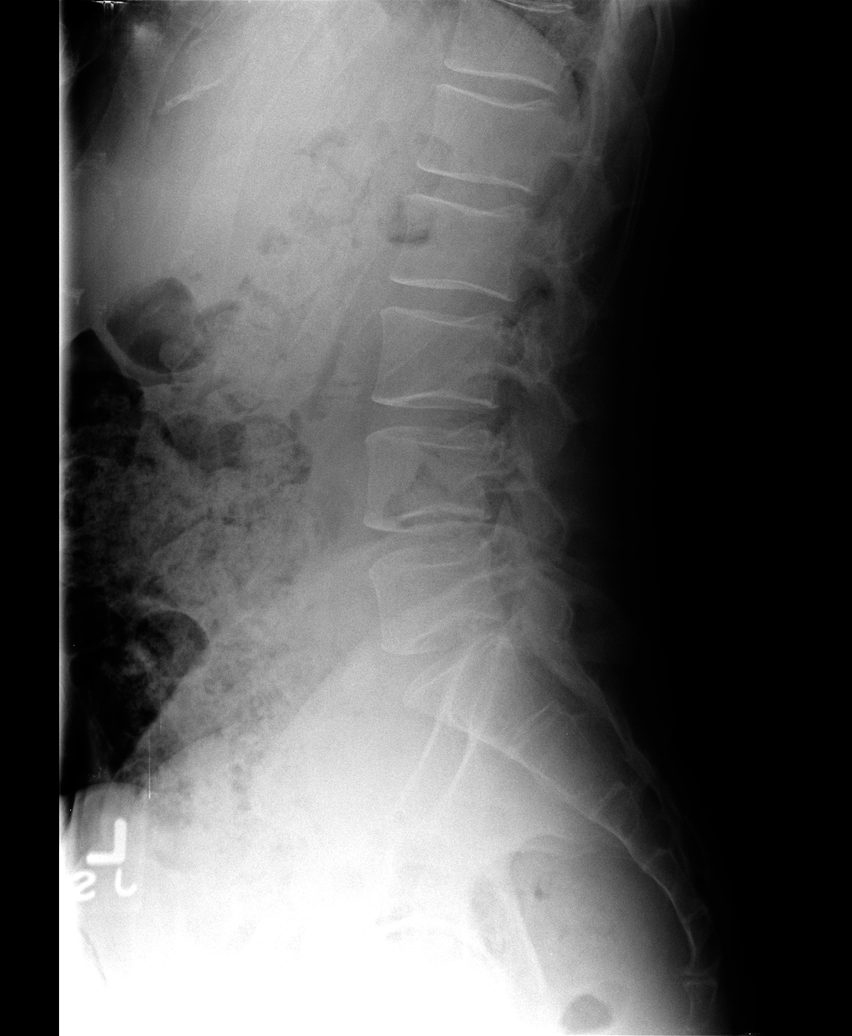

[view not recorded (5 of 5)]
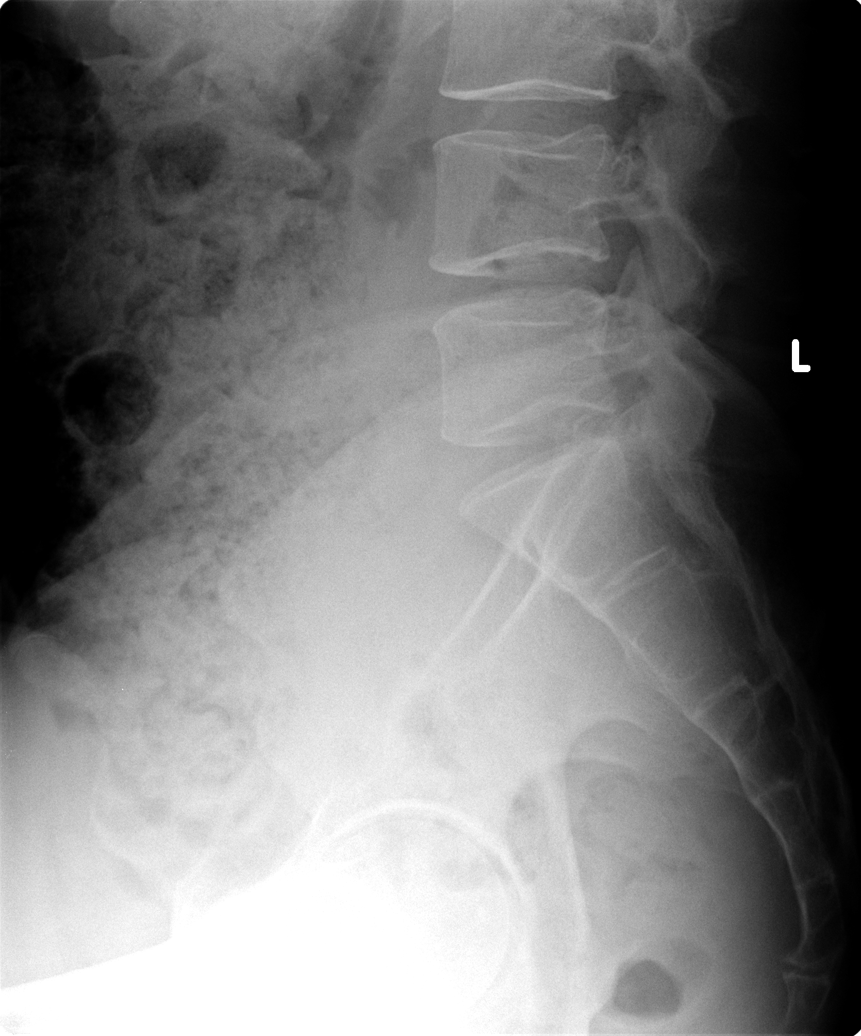

[5 of 5 positions shown; findings below may reference images not displayed]

FINDINGS: Five lumbar-type vertebral bodies.

Normal lumbar lordosis.

No evidence of fracture or dislocation.  The vertebral body heights
are maintained.

Very mild superior endplate changes at L4 and L5.
IMPRESSION: No fracture or dislocation is seen.

Mild degenerative changes.

## 2012-10-29 ENCOUNTER — Other Ambulatory Visit (INDEPENDENT_AMBULATORY_CARE_PROVIDER_SITE_OTHER): Payer: BC Managed Care – PPO

## 2012-10-29 DIAGNOSIS — Z0000 Encounter for general adult medical examination without abnormal findings: Secondary | ICD-10-CM

## 2012-10-29 LAB — POCT URINALYSIS DIPSTICK
Leukocytes, UA: NEGATIVE
Protein, UA: NEGATIVE
Urobilinogen, UA: 0.2
pH, UA: 6

## 2012-10-29 LAB — HEPATIC FUNCTION PANEL
AST: 15 U/L (ref 0–37)
Albumin: 4.6 g/dL (ref 3.5–5.2)
Alkaline Phosphatase: 45 U/L (ref 39–117)
Bilirubin, Direct: 0.2 mg/dL (ref 0.0–0.3)
Total Protein: 7.2 g/dL (ref 6.0–8.3)

## 2012-10-29 LAB — CBC WITH DIFFERENTIAL/PLATELET
Basophils Relative: 0.8 % (ref 0.0–3.0)
HCT: 43.3 % (ref 39.0–52.0)
Hemoglobin: 15 g/dL (ref 13.0–17.0)
Lymphocytes Relative: 30.7 % (ref 12.0–46.0)
Lymphs Abs: 1.2 10*3/uL (ref 0.7–4.0)
MCHC: 34.7 g/dL (ref 30.0–36.0)
Monocytes Relative: 8.8 % (ref 3.0–12.0)
Neutro Abs: 2.3 10*3/uL (ref 1.4–7.7)
RBC: 5.12 Mil/uL (ref 4.22–5.81)

## 2012-10-29 LAB — LIPID PANEL
HDL: 55 mg/dL (ref >39.00)
Total CHOL/HDL Ratio: 3

## 2012-10-29 LAB — BASIC METABOLIC PANEL
CO2: 30 mEq/L (ref 19–32)
Calcium: 9.3 mg/dL (ref 8.4–10.5)
GFR: 67.46 mL/min (ref >60.00)
Potassium: 4.7 mEq/L (ref 3.5–5.1)
Sodium: 139 mEq/L (ref 135–145)

## 2012-10-30 LAB — VITAMIN D 25 HYDROXY (VIT D DEFICIENCY, FRACTURES): Vit D, 25-Hydroxy: 45 ng/mL (ref 30–89)

## 2012-11-05 ENCOUNTER — Encounter: Payer: Self-pay | Admitting: Internal Medicine

## 2012-11-05 ENCOUNTER — Ambulatory Visit (INDEPENDENT_AMBULATORY_CARE_PROVIDER_SITE_OTHER): Payer: BC Managed Care – PPO | Admitting: Internal Medicine

## 2012-11-05 VITALS — BP 136/84 | HR 72 | Temp 98.2°F | Resp 16 | Ht 69.0 in | Wt 168.0 lb

## 2012-11-05 DIAGNOSIS — Z0000 Encounter for general adult medical examination without abnormal findings: Secondary | ICD-10-CM

## 2012-11-05 DIAGNOSIS — J329 Chronic sinusitis, unspecified: Secondary | ICD-10-CM

## 2012-11-05 DIAGNOSIS — R51 Headache: Secondary | ICD-10-CM

## 2012-11-05 DIAGNOSIS — E785 Hyperlipidemia, unspecified: Secondary | ICD-10-CM

## 2012-11-05 MED ORDER — SULFAMETHOXAZOLE-TMP DS 800-160 MG PO TABS: 1.0000 | ORAL_TABLET | Freq: Two times a day (BID) | ORAL | Status: DC

## 2012-11-05 MED ORDER — SULFAMETHOXAZOLE-TRIMETHOPRIM 800-160 MG PO TABS: 1.0000 | ORAL_TABLET | Freq: Two times a day (BID) | ORAL | Status: DC

## 2012-11-05 NOTE — Progress Notes (Signed)
Subjective:    Patient ID: [name], male    DOB: [birth year], 52 y.o.   MRN: [id number]  HPI CPX Acute on chronic sinus symptoms Migraine patter change with increased migraines over the last week   Review of Systems  Constitutional: Negative for fever and fatigue.  HENT: Positive for congestion, rhinorrhea and postnasal drip. Negative for hearing loss and neck pain.   Eyes: Negative for discharge, redness and visual disturbance.  Respiratory: Negative for cough, shortness of breath and wheezing.   Cardiovascular: Negative for leg swelling.  Gastrointestinal: Negative for abdominal pain, constipation and abdominal distention.  Genitourinary: Negative for urgency and frequency.  Musculoskeletal: Negative for joint swelling and arthralgias.  Skin: Negative for color change and rash.  Neurological: Negative for weakness and light-headedness.  Hematological: Negative for adenopathy.  Psychiatric/Behavioral: Negative for behavioral problems.   Past Medical History  Diagnosis Date  . Allergic rhinitis   . Migraines   . GERD (gastroesophageal reflux disease)   . Ulcerative proctosigmoiditis 2001  . Internal hemorrhoids     History   Social History  . Marital Status: Single    Spouse Name: Domestic partner    Number of Children: N/A  . Years of Education: N/A   Occupational History  . College professor    Social History Main Topics  . Smoking status: Never Smoker   . Smokeless tobacco: Never Used  . Alcohol Use: 0.0 oz/week     Comment: 1-2 a day  . Drug Use: No  . Sexually Active: Not on file   Other Topics Concern  . Not on file   Social History Narrative  . No narrative on file    Past Surgical History  Procedure Date  . Eye muscle transplant     Bilateral    Family History  Problem Relation Age of Onset  . Thyroid disease Father   . Arthritis Mother   . Colon cancer      Grandmother's Family (x 1 brother to grandmother and 1 sister to grandmother)   . Crohn's disease Paternal Aunt   . Crohn's disease Cousin   . Colon polyps Maternal Grandmother     Great Aunt, Great Uncle  . Heart disease Brother   . Heart disease Father     a fib, expanded aorta    Allergies  Allergen Reactions  . Codeine Nausea Only    Current Outpatient Prescriptions on File Prior to Visit  Medication Sig Dispense Refill  . balsalazide (COLAZAL) 750 MG capsule Take 3 capsules (2,250 mg total) by mouth 3 (three) times daily.  270 capsule  3  . cetirizine (ZYRTEC) 10 MG tablet Take 10 mg by mouth daily.      . clobetasol (OLUX) 0.05 % topical foam Apply topically daily as needed.  50 g  11  . DiphenhydrAMINE HCl, Sleep, 25 MG TBDP Take 1 tablet (25 mg total) by mouth at bedtime.  30 each  6  . doxepin (SINEQUAN) 10 MG capsule Take 1 capsule (10 mg total) by mouth at bedtime.  30 capsule  11  . fluticasone (FLONASE) 50 MCG/ACT nasal spray USE ONE SPRAY IN EACH NOSTRIL TWICE DAILY  48 g  2  . ibuprofen (ADVIL,MOTRIN) 200 MG tablet Take 200 mg by mouth as needed.        . mesalamine (ROWASA) 4 G enema Place 4 g rectally 3 times/day as needed-between meals & bedtime.      Marland Kitchen oxyCODONE-acetaminophen (PERCOCET/ROXICET) 5-325 MG per tablet Take 1  tablet by mouth every 6 (six) hours as needed.  30 tablet  0  . SUMAtriptan (IMITREX) 100 MG tablet Take 1 tablet (100 mg total) by mouth every 2 (two) hours as needed for migraine.  9 tablet  11  . Vitamin D, Ergocalciferol, (DRISDOL) 50000 UNITS CAPS TAKE ONE CAPSULE BY MOUTH WEEKLY  15 capsule  2  . [DISCONTINUED] glycopyrrolate (ROBINUL) 2 MG tablet Take 2 mg by mouth 2 (two) times daily.          BP 136/84  Pulse 72  Temp 98.2 F (36.8 C)  Resp 16  Ht 5' 9"  (1.753 m)  Wt 168 lb (76.204 kg)  BMI 24.81 kg/m2        Objective:   Physical Exam  Nursing note and vitals reviewed. Constitutional: He is oriented to person, place, and time. He appears well-developed and well-nourished.  HENT:  Head: Normocephalic  and atraumatic.  Eyes: Conjunctivae normal are normal. Pupils are equal, round, and reactive to light.  Neck: Normal range of motion. Neck supple.  Cardiovascular: Normal rate and regular rhythm.   Pulmonary/Chest: Effort normal and breath sounds normal.  Abdominal: Soft. Bowel sounds are normal.  Musculoskeletal: Normal range of motion.  Neurological: He is alert and oriented to person, place, and time.  Skin: Skin is warm and dry.  Psychiatric: He has a normal mood and affect. His behavior is normal.          Assessment & Plan:   Patient presents for yearly preventative medicine examination.   all immunizations and health maintenance protocols were reviewed with the patient and they are up to date with these protocols.   screening laboratory values were reviewed with the patient including screening of hyperlipidemia PSA renal function and hepatic function.   There medications past medical history social history problem list and allergies were reviewed in detail.   Goals were established with regard to weight loss exercise diet in compliance with medications  Increase in migraine patter complicated and precipitated by chronic sinus infection Treat with bactrim for 14 days

## 2012-11-05 NOTE — Patient Instructions (Addendum)
The patient is instructed to continue all medications as prescribed. Schedule followup with check out clerk upon leaving the clinic  

## 2012-12-07 ENCOUNTER — Other Ambulatory Visit: Payer: Self-pay | Admitting: Internal Medicine

## 2012-12-11 ENCOUNTER — Other Ambulatory Visit: Payer: Self-pay | Admitting: Internal Medicine

## 2013-01-16 ENCOUNTER — Encounter (INDEPENDENT_AMBULATORY_CARE_PROVIDER_SITE_OTHER): Payer: BC Managed Care – PPO | Admitting: Ophthalmology

## 2013-01-16 DIAGNOSIS — H2510 Age-related nuclear cataract, unspecified eye: Secondary | ICD-10-CM

## 2013-01-16 DIAGNOSIS — H43819 Vitreous degeneration, unspecified eye: Secondary | ICD-10-CM

## 2013-01-16 DIAGNOSIS — H3530 Unspecified macular degeneration: Secondary | ICD-10-CM

## 2013-01-21 ENCOUNTER — Other Ambulatory Visit: Payer: Self-pay | Admitting: Internal Medicine

## 2013-01-28 ENCOUNTER — Other Ambulatory Visit: Payer: Self-pay | Admitting: *Deleted

## 2013-01-28 ENCOUNTER — Encounter: Payer: Self-pay | Admitting: *Deleted

## 2013-01-28 MED ORDER — VITAMIN D (ERGOCALCIFEROL) 1.25 MG (50000 UNIT) PO CAPS: ORAL_CAPSULE | ORAL | Status: DC

## 2013-02-06 ENCOUNTER — Other Ambulatory Visit: Payer: Self-pay | Admitting: *Deleted

## 2013-02-06 DIAGNOSIS — G4700 Insomnia, unspecified: Secondary | ICD-10-CM

## 2013-02-06 MED ORDER — DOXEPIN HCL 10 MG PO CAPS: 10.0000 mg | ORAL_CAPSULE | Freq: Every day | ORAL | Status: DC

## 2013-02-22 ENCOUNTER — Encounter: Payer: Self-pay | Admitting: Internal Medicine

## 2013-02-22 ENCOUNTER — Ambulatory Visit (INDEPENDENT_AMBULATORY_CARE_PROVIDER_SITE_OTHER): Payer: BC Managed Care – PPO | Admitting: Internal Medicine

## 2013-02-22 VITALS — BP 140/90 | HR 76 | Temp 98.2°F | Resp 16 | Ht 69.0 in | Wt 174.0 lb

## 2013-02-22 DIAGNOSIS — N453 Epididymo-orchitis: Secondary | ICD-10-CM

## 2013-02-22 DIAGNOSIS — N451 Epididymitis: Secondary | ICD-10-CM

## 2013-02-22 MED ORDER — CEPHALEXIN 500 MG PO CAPS: 500.0000 mg | ORAL_CAPSULE | Freq: Three times a day (TID) | ORAL | Status: DC

## 2013-02-22 NOTE — Patient Instructions (Signed)
Epididymitis Epididymitis is a swelling (inflammation) of the epididymis. The epididymis is a cord-like structure along the back part of the testicle. Epididymitis is usually, but not always, caused by infection. This is usually a sudden problem beginning with chills, fever and pain behind the scrotum and in the testicle. There may be swelling and redness of the testicle. DIAGNOSIS  Physical examination will reveal a tender, swollen epididymis. Sometimes, cultures are obtained from the urine or from prostate secretions to help find out if there is an infection or if the cause is a different problem. Sometimes, blood work is performed to see if your white blood cell count is elevated and if a germ (bacterial) or viral infection is present. Using this knowledge, an appropriate medicine which kills germs (antibiotic) can be chosen by your caregiver. A viral infection causing epididymitis will most often go away (resolve) without treatment. HOME CARE INSTRUCTIONS   Hot sitz baths for 20 minutes, 4 times per day, may help relieve pain.  Only take over-the-counter or prescription medicines for pain, discomfort or fever as directed by your caregiver.  Take all medicines, including antibiotics, as directed. Take the antibiotics for the full prescribed length of time even if you are feeling better.  It is very important to keep all follow-up appointments. SEEK IMMEDIATE MEDICAL CARE IF:   You have a fever.  You have pain not relieved with medicines.  You have any worsening of your problems.  Your pain seems to come and go.  You develop pain, redness, and swelling in the scrotum and surrounding areas. MAKE SURE YOU:   Understand these instructions.  Will watch your condition.  Will get help right away if you are not doing well or get worse. Document Released: 09/23/2000 Document Revised: 12/19/2011 Document Reviewed: 08/13/2009 [facility] Patient Information 2013 [place].

## 2013-02-22 NOTE — Progress Notes (Signed)
Subjective:    Patient ID: [name], male    DOB: 1960/10/18, 52 y.o.   MRN: [id number]  HPI  Mild to moderate pain in the left testical Radiates to the leg and to the abdomen Two weeks travelling    Review of Systems  Constitutional: Negative for fever and fatigue.  HENT: Negative for hearing loss, congestion, neck pain and postnasal drip.   Eyes: Negative for discharge, redness and visual disturbance.  Respiratory: Negative for cough, shortness of breath and wheezing.   Cardiovascular: Negative for leg swelling.  Gastrointestinal: Negative for abdominal pain, constipation and abdominal distention.  Genitourinary: Positive for testicular pain. Negative for urgency and frequency.  Musculoskeletal: Negative for joint swelling and arthralgias.  Skin: Negative for color change and rash.  Neurological: Negative for weakness and light-headedness.  Hematological: Negative for adenopathy.  Psychiatric/Behavioral: Negative for behavioral problems.       Past Medical History  Diagnosis Date  . Allergic rhinitis   . Migraines   . GERD (gastroesophageal reflux disease)   . Ulcerative proctosigmoiditis 2001  . Internal hemorrhoids     History   Social History  . Marital Status: Single    Spouse Name: Domestic partner    Number of Children: N/A  . Years of Education: N/A   Occupational History  . College professor    Social History Main Topics  . Smoking status: Never Smoker   . Smokeless tobacco: Never Used  . Alcohol Use: 0.0 oz/week     Comment: 1-2 a day  . Drug Use: No  . Sexually Active: Not on file   Other Topics Concern  . Not on file   Social History Narrative  . No narrative on file    Past Surgical History  Procedure Laterality Date  . Eye muscle transplant      Bilateral    Family History  Problem Relation Age of Onset  . Thyroid disease Father   . Arthritis Mother   . Colon cancer      Grandmother's Family (x 1 brother to grandmother and  1 sister to grandmother)  . Crohn's disease Paternal Aunt   . Crohn's disease Cousin   . Colon polyps Maternal Grandmother     Great Aunt, Great Uncle  . Heart disease Brother   . Heart disease Father     a fib, expanded aorta    Allergies  Allergen Reactions  . Codeine Nausea Only    Current Outpatient Prescriptions on File Prior to Visit  Medication Sig Dispense Refill  . cetirizine (ZYRTEC) 10 MG tablet Take 10 mg by mouth daily.      . clobetasol (OLUX) 0.05 % topical foam Apply topically daily as needed.  50 g  11  . DiphenhydrAMINE HCl, Sleep, 25 MG TBDP Take 1 tablet (25 mg total) by mouth at bedtime.  30 each  6  . fluticasone (FLONASE) 50 MCG/ACT nasal spray USE ONE SPRAY IN EACH NOSTRIL TWICE DAILY  48 g  2  . ibuprofen (ADVIL,MOTRIN) 200 MG tablet Take 200 mg by mouth as needed.        Marland Kitchen KRILL OIL 1000 MG CAPS Take 1 capsule by mouth daily.      . mesalamine (CANASA) 1000 MG suppository Place 1,000 mg rectally 2 (two) times daily as needed.      . mesalamine (ROWASA) 4 G enema Place 4 g rectally 3 times/day as needed-between meals & bedtime.      Marland Kitchen oxyCODONE-acetaminophen (PERCOCET/ROXICET) 5-325 MG per  tablet Take 1 tablet by mouth every 6 (six) hours as needed.  30 tablet  0  . sulfamethoxazole-trimethoprim (BACTRIM DS) 800-160 MG per tablet Take 1 tablet by mouth 2 (two) times daily.  28 tablet  0  . SUMAtriptan (IMITREX) 100 MG tablet Take 1 tablet (100 mg total) by mouth every 2 (two) hours as needed for migraine.  9 tablet  11  . balsalazide (COLAZAL) 750 MG capsule TAKE THREE CAPSULES BY MOUTH THREE TIMES DAILY  270 capsule  2  . doxepin (SINEQUAN) 10 MG capsule Take 1 capsule (10 mg total) by mouth at bedtime.  30 capsule  6  . Vitamin D, Ergocalciferol, (DRISDOL) 50000 UNITS CAPS TAKE ONE CAPSULE BY MOUTH WEEKLY  15 capsule  1  . [DISCONTINUED] glycopyrrolate (ROBINUL) 2 MG tablet Take 2 mg by mouth 2 (two) times daily.         No current facility-administered  medications on file prior to visit.    BP 140/90  Pulse 76  Temp(Src) 98.2 F (36.8 C)  Resp 16  Ht 5' 9"  (1.753 m)  Wt 174 lb (78.926 kg)  BMI 25.68 kg/m2    Objective:   Physical Exam  Nursing note and vitals reviewed. Constitutional: He appears well-developed and well-nourished.  HENT:  Head: Normocephalic and atraumatic.  Eyes: Conjunctivae are normal. Pupils are equal, round, and reactive to light.  Neck: Normal range of motion. Neck supple.  Cardiovascular: Normal rate and regular rhythm.   Pulmonary/Chest: Effort normal and breath sounds normal.  Abdominal: Soft. Bowel sounds are normal.          Assessment & Plan:  Testicular pain along the right testicle is minimal testicular pain in the left epididymis is moderate to significant and duplicates the radiation of his pain there is no focal masses appreciated in either testicle testicle size and consistency is normal. No evidence of hernia

## 2013-02-24 ENCOUNTER — Other Ambulatory Visit: Payer: Self-pay | Admitting: Internal Medicine

## 2013-02-25 ENCOUNTER — Encounter: Payer: Self-pay | Admitting: Internal Medicine

## 2013-02-25 ENCOUNTER — Other Ambulatory Visit: Payer: Self-pay | Admitting: Internal Medicine

## 2013-02-25 ENCOUNTER — Other Ambulatory Visit: Payer: Self-pay | Admitting: *Deleted

## 2013-02-25 DIAGNOSIS — N50819 Testicular pain, unspecified: Secondary | ICD-10-CM

## 2013-02-27 ENCOUNTER — Ambulatory Visit
Admission: RE | Admit: 2013-02-27 | Discharge: 2013-02-27 | Disposition: A | Discharge status: Home / Self Care | Payer: BC Managed Care – PPO | Source: Ambulatory Visit | Attending: Internal Medicine | Admitting: Internal Medicine

## 2013-02-27 ENCOUNTER — Other Ambulatory Visit: Payer: BC Managed Care – PPO

## 2013-02-27 ENCOUNTER — Other Ambulatory Visit: Payer: Self-pay | Admitting: *Deleted

## 2013-02-27 DIAGNOSIS — N50819 Testicular pain, unspecified: Secondary | ICD-10-CM

## 2013-02-27 MED ORDER — FLUTICASONE PROPIONATE 50 MCG/ACT NA SUSP: NASAL | Status: DC

## 2013-02-28 ENCOUNTER — Encounter: Payer: Self-pay | Admitting: Internal Medicine

## 2013-03-01 ENCOUNTER — Telehealth: Payer: Self-pay | Admitting: Internal Medicine

## 2013-03-01 MED ORDER — INDOMETHACIN 25 MG PO CAPS: 25.0000 mg | ORAL_CAPSULE | Freq: Three times a day (TID) | ORAL | Status: DC

## 2013-03-01 NOTE — Telephone Encounter (Signed)
Per dr Arnoldo Morale- epidymidis with fluid- try indocin 25 tid for 10 days- pt informed and med sent in

## 2013-03-01 NOTE — Telephone Encounter (Signed)
PT is inquiring about his test results from 02/27/13. Please assist.

## 2013-04-22 ENCOUNTER — Other Ambulatory Visit: Payer: Self-pay | Admitting: Internal Medicine

## 2013-04-29 ENCOUNTER — Other Ambulatory Visit: Payer: Self-pay | Admitting: *Deleted

## 2013-04-29 ENCOUNTER — Encounter: Payer: Self-pay | Admitting: Internal Medicine

## 2013-04-29 MED ORDER — MESALAMINE 1000 MG RE SUPP: 1000.0000 mg | Freq: Every day | RECTAL | Status: DC

## 2013-05-06 ENCOUNTER — Encounter: Payer: Self-pay | Admitting: Internal Medicine

## 2013-05-06 ENCOUNTER — Ambulatory Visit (INDEPENDENT_AMBULATORY_CARE_PROVIDER_SITE_OTHER): Payer: BC Managed Care – PPO | Admitting: Internal Medicine

## 2013-05-06 VITALS — BP 110/70 | HR 68 | Temp 98.2°F | Resp 16 | Ht 69.0 in | Wt 172.0 lb

## 2013-05-06 DIAGNOSIS — K51311 Ulcerative (chronic) rectosigmoiditis with rectal bleeding: Secondary | ICD-10-CM

## 2013-05-06 DIAGNOSIS — K5130 Ulcerative (chronic) rectosigmoiditis without complications: Secondary | ICD-10-CM

## 2013-05-06 NOTE — Patient Instructions (Addendum)
Lora bar....  To see if  You are having glycemic drops

## 2013-05-06 NOTE — Progress Notes (Signed)
Subjective:    Patient ID: [name], male    DOB: November 30, 1960, 52 y.o.   MRN: [id number]  HPI The patient has noted not relationship between ETOH and migraines and has determined that most migraines occur about 3-4 in the afternoon caffeine restriction has led with sleep but not changed the head aches Flair of colitis travelling. Mucous present in stool Mild perianal tenderness.      Review of Systems  Constitutional: Negative for fever and fatigue.  HENT: Negative for hearing loss, congestion, neck pain and postnasal drip.   Eyes: Negative for discharge, redness and visual disturbance.  Respiratory: Negative for cough, shortness of breath and wheezing.   Cardiovascular: Negative for leg swelling.  Gastrointestinal: Positive for abdominal pain, diarrhea and anal bleeding. Negative for constipation and abdominal distention.  Genitourinary: Negative for urgency and frequency.  Musculoskeletal: Negative for joint swelling and arthralgias.  Skin: Negative for color change and rash.  Neurological: Positive for headaches. Negative for weakness and light-headedness.  Hematological: Negative for adenopathy.  Psychiatric/Behavioral: Negative for behavioral problems.   Past Medical History  Diagnosis Date  . Allergic rhinitis   . Migraines   . GERD (gastroesophageal reflux disease)   . Ulcerative proctosigmoiditis 2001  . Internal hemorrhoids     History   Social History  . Marital Status: Single    Spouse Name: Domestic partner    Number of Children: N/A  . Years of Education: N/A   Occupational History  . College professor    Social History Main Topics  . Smoking status: Never Smoker   . Smokeless tobacco: Never Used  . Alcohol Use: 0.0 oz/week     Comment: 1-2 a day  . Drug Use: No  . Sexually Active: Not on file   Other Topics Concern  . Not on file   Social History Narrative  . No narrative on file    Past Surgical History  Procedure Laterality Date  .  Eye muscle transplant      Bilateral    Family History  Problem Relation Age of Onset  . Thyroid disease Father   . Arthritis Mother   . Colon cancer      Grandmother's Family (x 1 brother to grandmother and 1 sister to grandmother)  . Crohn's disease Paternal Aunt   . Crohn's disease Cousin   . Colon polyps Maternal Grandmother     Great Aunt, Great Uncle  . Heart disease Brother   . Heart disease Father     a fib, expanded aorta    Allergies  Allergen Reactions  . Codeine Nausea Only    Current Outpatient Prescriptions on File Prior to Visit  Medication Sig Dispense Refill  . balsalazide (COLAZAL) 750 MG capsule TAKE THREE CAPSULES BY MOUTH THREE TIMES DAILY  270 capsule  2  . cetirizine (ZYRTEC) 10 MG tablet Take 10 mg by mouth daily.      . clobetasol (OLUX) 0.05 % topical foam Apply topically daily as needed.  50 g  11  . DiphenhydrAMINE HCl, Sleep, 25 MG TBDP Take 1 tablet (25 mg total) by mouth at bedtime.  30 each  6  . doxepin (SINEQUAN) 10 MG capsule Take 1 capsule (10 mg total) by mouth at bedtime.  30 capsule  6  . fluticasone (FLONASE) 50 MCG/ACT nasal spray USE ONE SPRAY IN EACH NOSTRIL TWICE DAILY  48 g  2  . ibuprofen (ADVIL,MOTRIN) 200 MG tablet Take 200 mg by mouth as needed.        Marland Kitchen  KRILL OIL 1000 MG CAPS Take 1 capsule by mouth daily.      . mesalamine (CANASA) 1000 MG suppository Place 1 suppository (1,000 mg total) rectally at bedtime.  30 suppository  5  . mesalamine (ROWASA) 4 G enema Place 4 g rectally 3 times/day as needed-between meals & bedtime.      Marland Kitchen oxyCODONE-acetaminophen (PERCOCET/ROXICET) 5-325 MG per tablet Take 1 tablet by mouth every 6 (six) hours as needed.  30 tablet  0  . SUMAtriptan (IMITREX) 100 MG tablet TAKE  ONE TABLET BY MOUTH EVERY 2 HOURS AS NEEDED FOR MIGRAINE  9 tablet  8  . Vitamin D, Ergocalciferol, (DRISDOL) 50000 UNITS CAPS TAKE ONE CAPSULE BY MOUTH WEEKLY  15 capsule  1  . [DISCONTINUED] glycopyrrolate (ROBINUL) 2 MG  tablet Take 2 mg by mouth 2 (two) times daily.         No current facility-administered medications on file prior to visit.    BP 110/70  Pulse 68  Temp(Src) 98.2 F (36.8 C)  Resp 16  Ht 5' 9"  (1.753 m)  Wt 172 lb (78.019 kg)  BMI 25.39 kg/m2       Objective:   Physical Exam  Nursing note and vitals reviewed. Constitutional: He appears well-developed and well-nourished.  HENT:  Head: Normocephalic and atraumatic.  Eyes: Conjunctivae are normal. Pupils are equal, round, and reactive to light.  Neck: Normal range of motion. Neck supple.  Cardiovascular: Normal rate and regular rhythm.   Pulmonary/Chest: Effort normal and breath sounds normal.  Abdominal: Soft. There is tenderness.          Assessment & Plan:  Weight loss due to diet Discussion of  Fruit and vegetable smooth. ? Fistula. Not evident Traveling to [place]

## 2013-05-10 ENCOUNTER — Encounter: Payer: Self-pay | Admitting: Internal Medicine

## 2013-05-10 ENCOUNTER — Other Ambulatory Visit: Payer: Self-pay | Admitting: *Deleted

## 2013-05-10 DIAGNOSIS — G4700 Insomnia, unspecified: Secondary | ICD-10-CM

## 2013-05-10 MED ORDER — DOXEPIN HCL 10 MG PO CAPS: 10.0000 mg | ORAL_CAPSULE | Freq: Every day | ORAL | Status: DC

## 2013-05-10 MED ORDER — VITAMIN D (ERGOCALCIFEROL) 1.25 MG (50000 UNIT) PO CAPS: ORAL_CAPSULE | ORAL | Status: DC

## 2013-06-10 ENCOUNTER — Other Ambulatory Visit: Payer: Self-pay | Admitting: Internal Medicine

## 2013-07-18 IMAGING — US US ART/VEN ABD/PELV/SCROTUM DOPPLER COMPLETE
1 series · 13 of 25 positions shown · non-contrast
Comparison: None

CLINICAL DATA: Left testicular pain for 7 days, no trauma

SCROTAL ULTRASOUND
DOPPLER ULTRASOUND OF THE TESTICLES
TECHNIQUE: Complete ultrasound examination of the testicles,
epididymis, and other scrotal structures was performed.  Color and
spectral Doppler ultrasound were also utilized to evaluate blood
flow to the testicles.

[Series 1: us art/ven abd/pelv/scrotum doppler complete · 0.07mm/px · 13 of 70 slices shown]
[im 1/70]
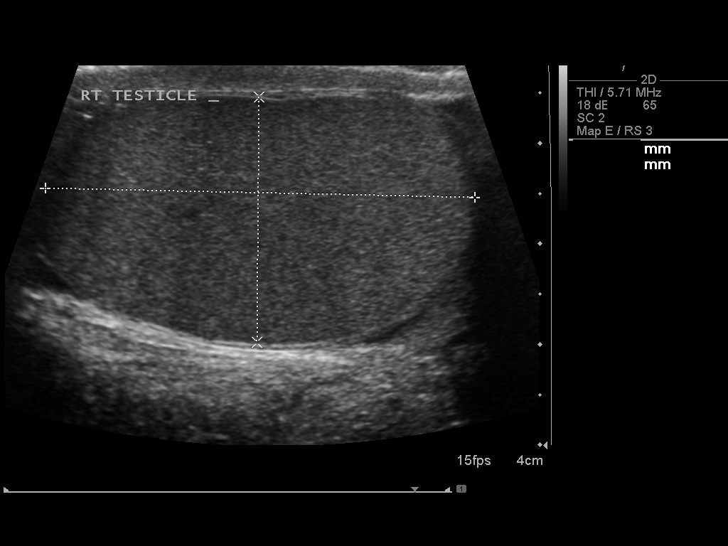
[im 6/70]
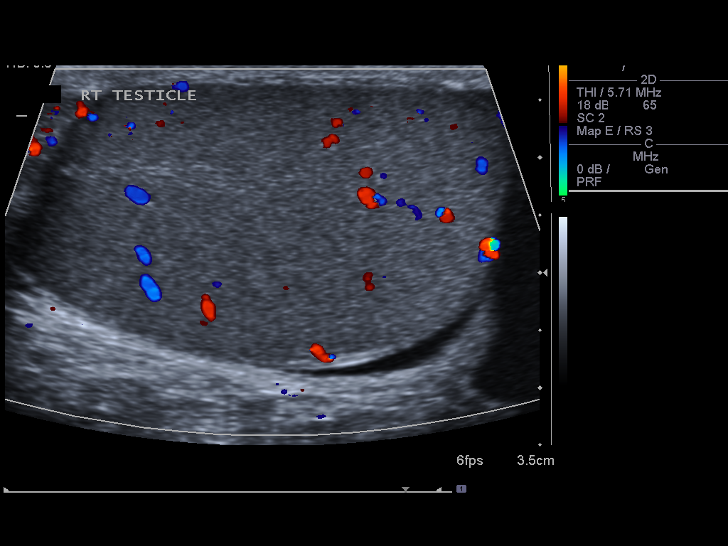
[im 12/70]
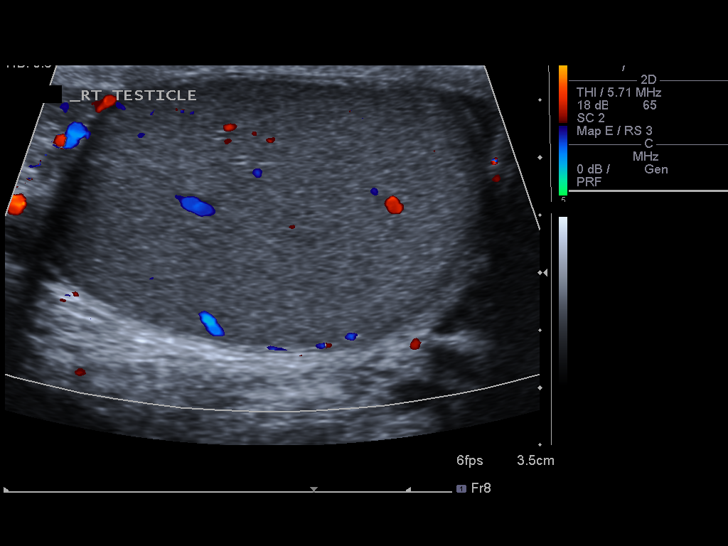
[im 18/70]
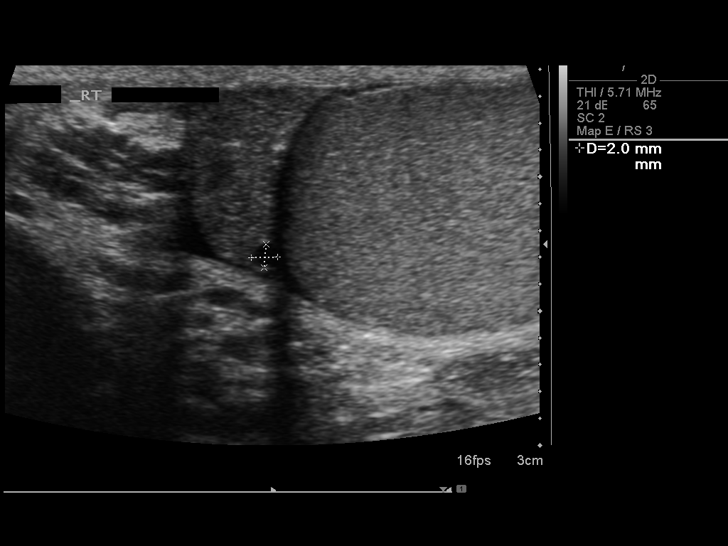
[im 24/70]
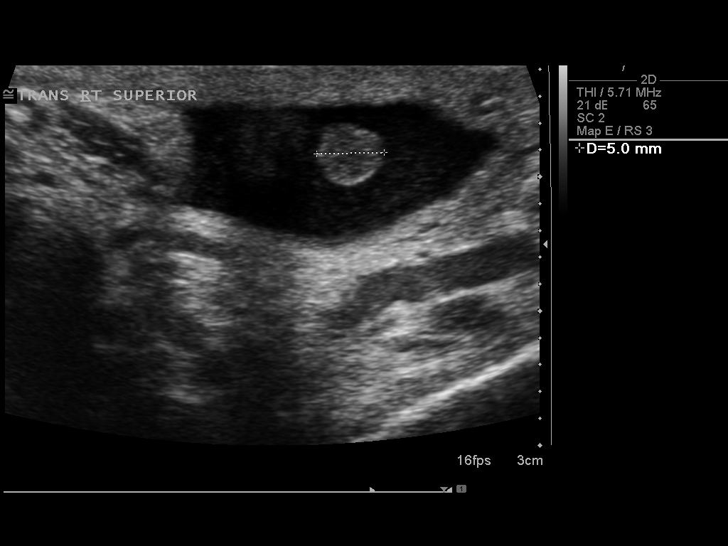
[im 29/70]
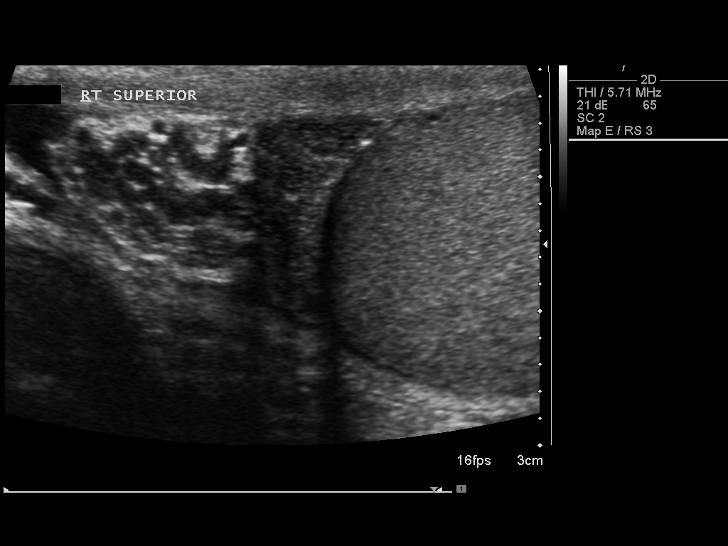
[im 35/70]
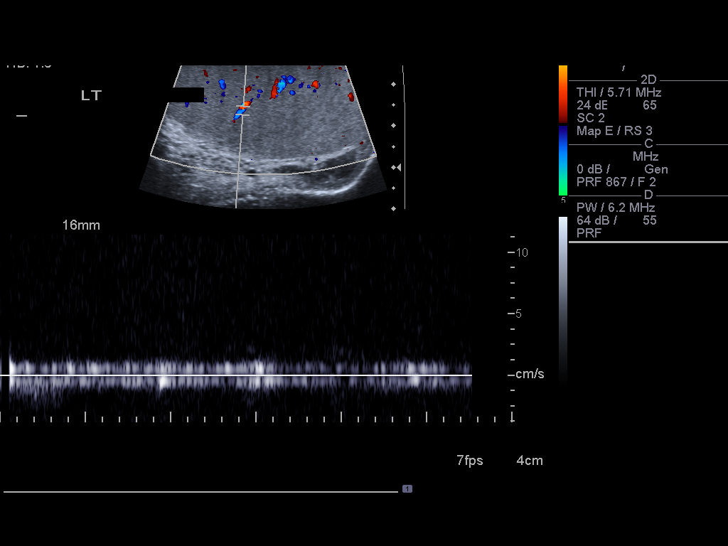
[im 41/70]
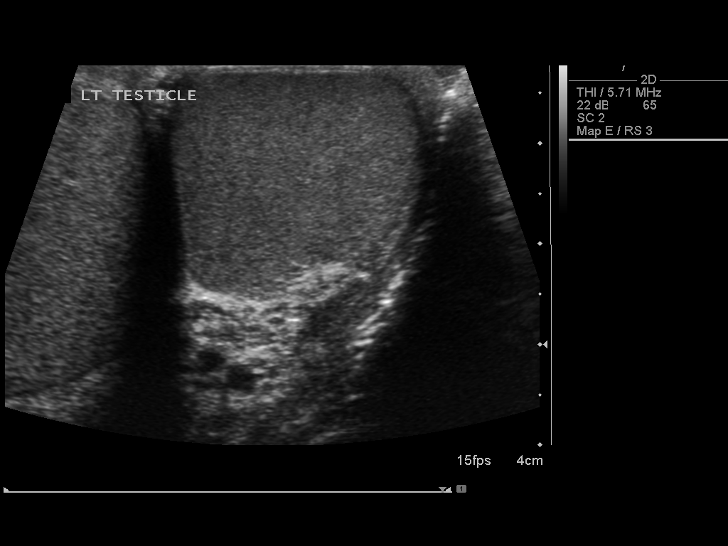
[im 47/70]
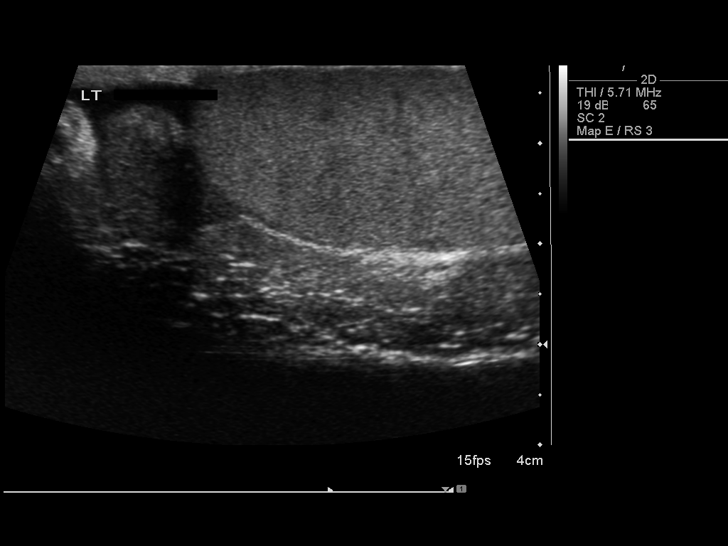
[im 52/70]
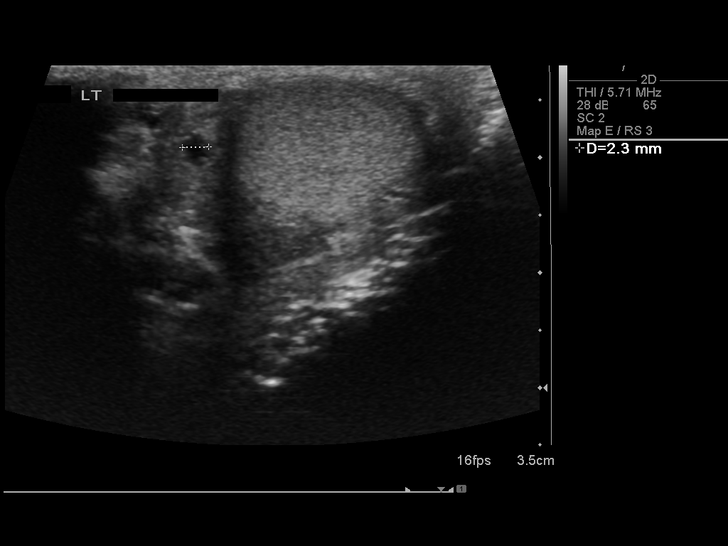
[im 58/70]
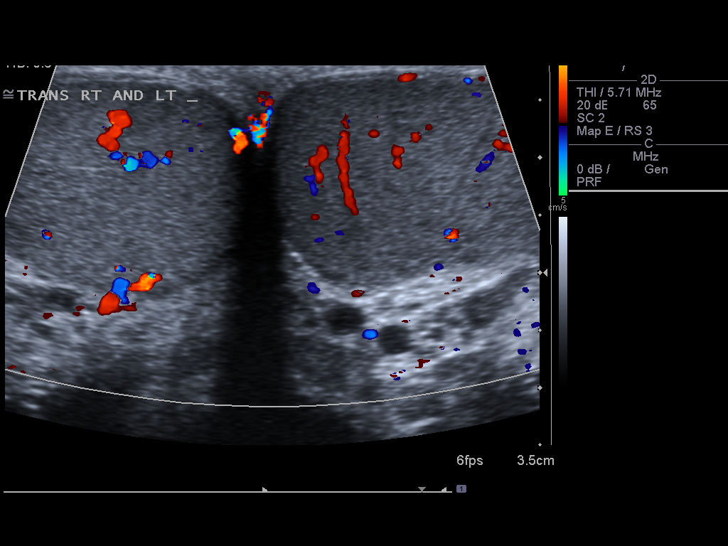
[im 64/70]
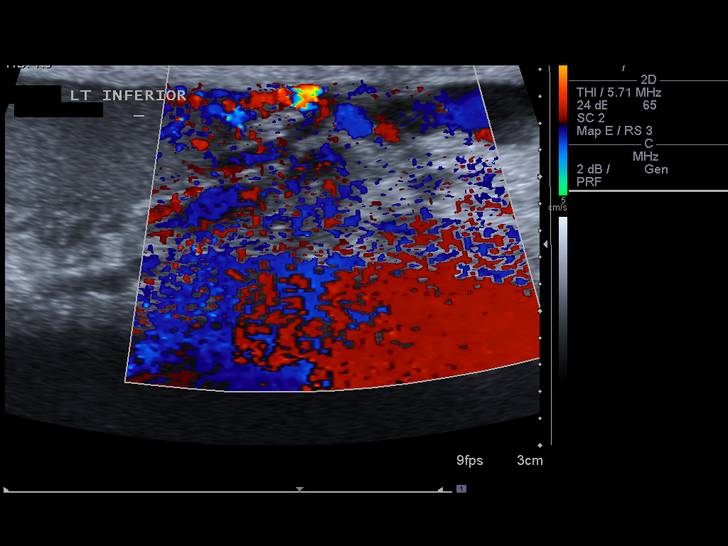
[im 70/70]
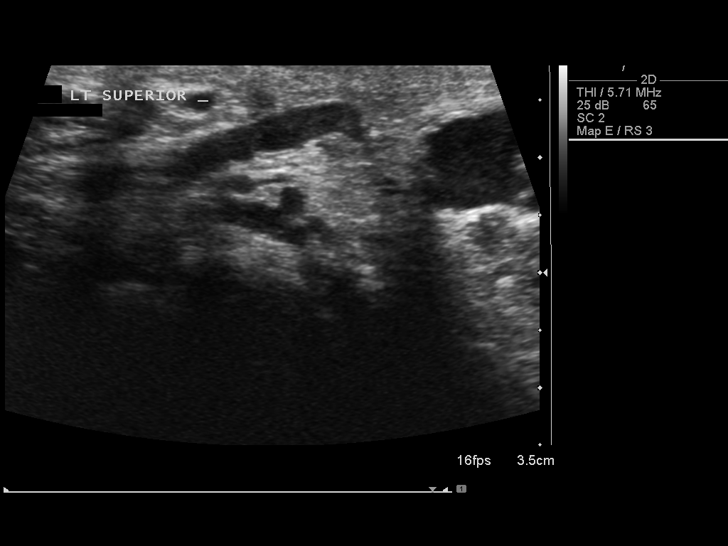

[13 of 25 positions shown; findings below may reference images not displayed]

FINDINGS: Right testis:  The right testicle is normal in size and
echogenicity.  There is blood flow demonstrated to the right
testicle with arterial and venous waveforms.

Left testis:  The left testicle also is normal in size and in
echogenicity.  Blood flow is demonstrated to the left testicle as
well with arterial and venous waveforms.

Right epididymis:  A 2 mm right epididymal cyst is present.

Left epididymis:  A 3 mm left epididymal cyst is noted.

Hydrocele:  There is a small amount of fluid bilaterally, right
slightly larger than left.

Varicocele:  There is a left varicocele superiorly and inferiorly.

Pulsed Doppler interrogation of both testes demonstrates low
resistance flow bilaterally.
IMPRESSION: 1.  Left varicocele.
2.  Small amount of fluid bilaterally right greater than left.
3.  No intratesticular abnormality.  Blood flow is demonstrated to
both testicles.

## 2013-08-15 ENCOUNTER — Other Ambulatory Visit: Payer: Self-pay

## 2013-10-16 ENCOUNTER — Encounter: Payer: Self-pay | Admitting: Gastroenterology

## 2013-10-16 ENCOUNTER — Other Ambulatory Visit: Payer: Self-pay | Admitting: Internal Medicine

## 2013-10-28 ENCOUNTER — Other Ambulatory Visit (INDEPENDENT_AMBULATORY_CARE_PROVIDER_SITE_OTHER): Payer: BC Managed Care – PPO

## 2013-10-28 DIAGNOSIS — Z0000 Encounter for general adult medical examination without abnormal findings: Secondary | ICD-10-CM

## 2013-10-28 LAB — HEPATIC FUNCTION PANEL
ALT: 10 U/L (ref 0–53)
AST: 14 U/L (ref 0–37)
Albumin: 4.5 g/dL (ref 3.5–5.2)
Alkaline Phosphatase: 32 U/L — ABNORMAL LOW (ref 39–117)
BILIRUBIN DIRECT: 0.1 mg/dL (ref 0.0–0.3)
BILIRUBIN TOTAL: 1.2 mg/dL (ref 0.3–1.2)
Total Protein: 6.9 g/dL (ref 6.0–8.3)

## 2013-10-28 LAB — BASIC METABOLIC PANEL
BUN: 13 mg/dL (ref 6–23)
CHLORIDE: 103 meq/L (ref 96–112)
CO2: 31 meq/L (ref 19–32)
CREATININE: 1 mg/dL (ref 0.4–1.5)
Calcium: 9.2 mg/dL (ref 8.4–10.5)
GFR: 81.83 mL/min (ref >60.00)
Glucose, Bld: 94 mg/dL (ref 70–99)
Potassium: 4.7 mEq/L (ref 3.5–5.1)
Sodium: 140 mEq/L (ref 135–145)

## 2013-10-28 LAB — LIPID PANEL
Cholesterol: 207 mg/dL — ABNORMAL HIGH (ref 0–200)
HDL: 57.4 mg/dL (ref >39.00)
TRIGLYCERIDES: 134 mg/dL (ref 0.0–149.0)
Total CHOL/HDL Ratio: 4
VLDL: 26.8 mg/dL (ref 0.0–40.0)

## 2013-10-28 LAB — CBC WITH DIFFERENTIAL/PLATELET
Basophils Absolute: 0 10*3/uL (ref 0.0–0.1)
Basophils Relative: 0.8 % (ref 0.0–3.0)
EOS ABS: 0.1 10*3/uL (ref 0.0–0.7)
Eosinophils Relative: 1.7 % (ref 0.0–5.0)
HCT: 41.9 % (ref 39.0–52.0)
Hemoglobin: 14.3 g/dL (ref 13.0–17.0)
Lymphocytes Relative: 30.5 % (ref 12.0–46.0)
Lymphs Abs: 1.3 10*3/uL (ref 0.7–4.0)
MCHC: 34.1 g/dL (ref 30.0–36.0)
MCV: 86 fl (ref 78.0–100.0)
MONO ABS: 0.4 10*3/uL (ref 0.1–1.0)
Monocytes Relative: 9.3 % (ref 3.0–12.0)
NEUTROS PCT: 57.7 % (ref 43.0–77.0)
Neutro Abs: 2.4 10*3/uL (ref 1.4–7.7)
PLATELETS: 200 10*3/uL (ref 150.0–400.0)
RBC: 4.88 Mil/uL (ref 4.22–5.81)
RDW: 13.3 % (ref 11.5–14.6)
WBC: 4.1 10*3/uL — AB (ref 4.5–10.5)

## 2013-10-28 LAB — POCT URINALYSIS DIPSTICK
Bilirubin, UA: NEGATIVE
Blood, UA: NEGATIVE
GLUCOSE UA: NEGATIVE
Ketones, UA: NEGATIVE
LEUKOCYTES UA: NEGATIVE
NITRITE UA: NEGATIVE
Protein, UA: NEGATIVE
Spec Grav, UA: 1.02
Urobilinogen, UA: 0.2
pH, UA: 6

## 2013-10-28 LAB — PSA: PSA: 0.28 ng/mL (ref 0.10–4.00)

## 2013-10-28 LAB — LDL CHOLESTEROL, DIRECT: Direct LDL: 135.1 mg/dL

## 2013-10-28 LAB — TSH: TSH: 0.81 u[IU]/mL (ref 0.35–5.50)

## 2013-10-30 ENCOUNTER — Other Ambulatory Visit: Payer: BC Managed Care – PPO

## 2013-11-01 ENCOUNTER — Ambulatory Visit (INDEPENDENT_AMBULATORY_CARE_PROVIDER_SITE_OTHER): Payer: BC Managed Care – PPO | Admitting: Family

## 2013-11-01 ENCOUNTER — Encounter: Payer: Self-pay | Admitting: Family

## 2013-11-01 VITALS — BP 130/90 | HR 58 | Ht 70.0 in | Wt 173.0 lb

## 2013-11-01 DIAGNOSIS — K5190 Ulcerative colitis, unspecified, without complications: Secondary | ICD-10-CM

## 2013-11-01 DIAGNOSIS — J309 Allergic rhinitis, unspecified: Secondary | ICD-10-CM

## 2013-11-01 DIAGNOSIS — Z23 Encounter for immunization: Secondary | ICD-10-CM

## 2013-11-01 DIAGNOSIS — G43909 Migraine, unspecified, not intractable, without status migrainosus: Secondary | ICD-10-CM

## 2013-11-01 DIAGNOSIS — Z0000 Encounter for general adult medical examination without abnormal findings: Secondary | ICD-10-CM

## 2013-11-01 MED ORDER — METHYLPREDNISOLONE 4 MG PO KIT: PACK | ORAL | Status: DC

## 2013-11-01 MED ORDER — DOXYCYCLINE HYCLATE 100 MG PO TABS: 100.0000 mg | ORAL_TABLET | Freq: Two times a day (BID) | ORAL | Status: DC

## 2013-11-01 MED ORDER — AMITRIPTYLINE HCL 10 MG PO TABS: 10.0000 mg | ORAL_TABLET | Freq: Every day | ORAL | Status: DC

## 2013-11-01 NOTE — Patient Instructions (Addendum)
Fat and Cholesterol Control Diet  Fat and cholesterol levels in your blood and organs are influenced by your diet. High levels of fat and cholesterol may lead to diseases of the heart, small and large blood vessels, gallbladder, liver, and pancreas.  CONTROLLING FAT AND CHOLESTEROL WITH DIET  Although exercise and lifestyle factors are important, your diet is key. That is because certain foods are known to raise cholesterol and others to lower it. The goal is to balance foods for their effect on cholesterol and more importantly, to replace saturated and trans fat with other types of fat, such as monounsaturated fat, polyunsaturated fat, and omega-3 fatty acids.  On average, a person should consume no more than 15 to 17 g of saturated fat daily. Saturated and trans fats are considered "bad" fats, and they will raise LDL cholesterol. Saturated fats are primarily found in animal products such as meats, butter, and cream. However, that does not mean you need to give up all your favorite foods. Today, there are good tasting, low-fat, low-cholesterol substitutes for most of the things you like to eat. Choose low-fat or nonfat alternatives. Choose round or loin cuts of red meat. These types of cuts are lowest in fat and cholesterol. Chicken (without the skin), fish, veal, and ground turkey breast are great choices. Eliminate fatty meats, such as hot dogs and salami. Even shellfish have little or no saturated fat. Have a 3 oz (85 g) portion when you eat lean meat, poultry, or fish.  Trans fats are also called "partially hydrogenated oils." They are oils that have been scientifically manipulated so that they are solid at room temperature resulting in a longer shelf life and improved taste and texture of foods in which they are added. Trans fats are found in stick margarine, some tub margarines, cookies, crackers, and baked goods.   When baking and cooking, oils are a great substitute for butter. The monounsaturated oils are  especially beneficial since it is believed they lower LDL and raise HDL. The oils you should avoid entirely are saturated tropical oils, such as coconut and palm.   Remember to eat a lot from food groups that are naturally free of saturated and trans fat, including fish, fruit, vegetables, beans, grains (barley, rice, couscous, bulgur wheat), and pasta (without cream sauces).   IDENTIFYING FOODS THAT LOWER FAT AND CHOLESTEROL   Soluble fiber may lower your cholesterol. This type of fiber is found in fruits such as apples, vegetables such as broccoli, potatoes, and carrots, legumes such as beans, peas, and lentils, and grains such as barley. Foods fortified with plant sterols (phytosterol) may also lower cholesterol. You should eat at least 2 g per day of these foods for a cholesterol lowering effect.   Read package labels to identify low-saturated fats, trans fat free, and low-fat foods at the supermarket. Select cheeses that have only 2 to 3 g saturated fat per ounce. Use a heart-healthy tub margarine that is free of trans fats or partially hydrogenated oil. When buying baked goods (cookies, crackers), avoid partially hydrogenated oils. Breads and muffins should be made from whole grains (whole-wheat or whole oat flour, instead of "flour" or "enriched flour"). Buy non-creamy canned soups with reduced salt and no added fats.   FOOD PREPARATION TECHNIQUES   Never deep-fry. If you must fry, either stir-fry, which uses very little fat, or use non-stick cooking sprays. When possible, broil, bake, or roast meats, and steam vegetables. Instead of putting butter or margarine on vegetables, use lemon   and herbs, applesauce, and cinnamon (for squash and sweet potatoes). Use nonfat yogurt, salsa, and low-fat dressings for salads.   LOW-SATURATED FAT / LOW-FAT FOOD SUBSTITUTES  Meats / Saturated Fat (g)  · Avoid: Steak, marbled (3 oz/85 g) / 11 g  · Choose: Steak, lean (3 oz/85 g) / 4 g  · Avoid: Hamburger (3 oz/85 g) / 7  g  · Choose: Hamburger, lean (3 oz/85 g) / 5 g  · Avoid: Ham (3 oz/85 g) / 6 g  · Choose: Ham, lean cut (3 oz/85 g) / 2.4 g  · Avoid: Chicken, with skin, dark meat (3 oz/85 g) / 4 g  · Choose: Chicken, skin removed, dark meat (3 oz/85 g) / 2 g  · Avoid: Chicken, with skin, light meat (3 oz/85 g) / 2.5 g  · Choose: Chicken, skin removed, light meat (3 oz/85 g) / 1 g  Dairy / Saturated Fat (g)  · Avoid: Whole milk (1 cup) / 5 g  · Choose: Low-fat milk, 2% (1 cup) / 3 g  · Choose: Low-fat milk, 1% (1 cup) / 1.5 g  · Choose: Skim milk (1 cup) / 0.3 g  · Avoid: Hard cheese (1 oz/28 g) / 6 g  · Choose: Skim milk cheese (1 oz/28 g) / 2 to 3 g  · Avoid: Cottage cheese, 4% fat (1 cup) / 6.5 g  · Choose: Low-fat cottage cheese, 1% fat (1 cup) / 1.5 g  · Avoid: Ice cream (1 cup) / 9 g  · Choose: Sherbet (1 cup) / 2.5 g  · Choose: Nonfat frozen yogurt (1 cup) / 0.3 g  · Choose: Frozen fruit bar / trace  · Avoid: Whipped cream (1 tbs) / 3.5 g  · Choose: Nondairy whipped topping (1 tbs) / 1 g  Condiments / Saturated Fat (g)  · Avoid: Mayonnaise (1 tbs) / 2 g  · Choose: Low-fat mayonnaise (1 tbs) / 1 g  · Avoid: Butter (1 tbs) / 7 g  · Choose: Extra light margarine (1 tbs) / 1 g  · Avoid: Coconut oil (1 tbs) / 11.8 g  · Choose: Olive oil (1 tbs) / 1.8 g  · Choose: Corn oil (1 tbs) / 1.7 g  · Choose: Safflower oil (1 tbs) / 1.2 g  · Choose: Sunflower oil (1 tbs) / 1.4 g  · Choose: Soybean oil (1 tbs) / 2.4 g  · Choose: Canola oil (1 tbs) / 1 g  Document Released: 09/26/2005 Document Revised: 01/21/2013 Document Reviewed: 03/17/2011  [employer]® Patient Information ©2014 [employer].

## 2013-11-01 NOTE — Progress Notes (Signed)
Subjective:    Patient ID: [name], male    DOB: July 05, 1961, 53 y.o.   MRN: [id number]  HPI  Patient presents for yearly preventative medicine examination. All immunizations and health maintenance protocols were reviewed with the patient and they are up to date with these protocols. Screening laboratory values were reviewed with the patient including screening of hyperlipidemia PSA renal function and hepatic function. There medications past medical history social history problem list and allergies were reviewed in detail. Goals were established with regard to weight loss exercise diet in compliance with medications  Patient also complains of migraine headaches are increasing in frequency occurring about 8-10 times per month. Is currently taken doxepin and doesn't appear to be helping much. Migraine headaches are typically in the right oral area. Has triptans that help sometimes. Would like to consider acupuncture at some point.   Has concerns of uncontrolled allergic rhinitis and has been taking Claritin that does not appear to be helping much. Takes Benadryl at night.   In due for colonoscopy screening, however is not sure if he should go since he has not had any issues with ulcerative colitis or bleeding.    Review of Systems  Constitutional: Negative.   HENT: Positive for congestion and rhinorrhea. Negative for postnasal drip and trouble swallowing.   Eyes: Negative.   Respiratory: Negative.   Cardiovascular: Negative.   Gastrointestinal: Negative.   Endocrine: Negative.   Genitourinary: Negative.   Musculoskeletal: Negative.   Skin: Negative.   Allergic/Immunologic: Negative.   Neurological: Negative.   Hematological: Negative.   Psychiatric/Behavioral: Negative.    Past Medical History  Diagnosis Date  . Allergic rhinitis   . Migraines   . GERD (gastroesophageal reflux disease)   . Ulcerative proctosigmoiditis 2001  . Internal hemorrhoids     History   Social  History  . Marital Status: Single    Spouse Name: Domestic partner    Number of Children: N/A  . Years of Education: N/A   Occupational History  . College professor    Social History Main Topics  . Smoking status: Never Smoker   . Smokeless tobacco: Never Used  . Alcohol Use: 0.0 oz/week     Comment: 1-2 a day  . Drug Use: No  . Sexual Activity: Not on file   Other Topics Concern  . Not on file   Social History Narrative  . No narrative on file    Past Surgical History  Procedure Laterality Date  . Eye muscle transplant      Bilateral    Family History  Problem Relation Age of Onset  . Thyroid disease Father   . Arthritis Mother   . Colon cancer      Grandmother's Family (x 1 brother to grandmother and 1 sister to grandmother)  . Crohn's disease Paternal Aunt   . Crohn's disease Cousin   . Colon polyps Maternal Grandmother     Great Aunt, Great Uncle  . Heart disease Brother   . Heart disease Father     a fib, expanded aorta    Allergies  Allergen Reactions  . Codeine Nausea Only    Current Outpatient Prescriptions on File Prior to Visit  Medication Sig Dispense Refill  . balsalazide (COLAZAL) 750 MG capsule TAKE THREE CAPSULES BY MOUTH THREE TIMES DAILY   270 capsule  0  . cetirizine (ZYRTEC) 10 MG tablet Take 10 mg by mouth daily.      . clobetasol (OLUX) 0.05 % topical foam  Apply topically daily as needed.  50 g  11  . DiphenhydrAMINE HCl, Sleep, 25 MG TBDP Take 1 tablet (25 mg total) by mouth at bedtime.  30 each  6  . fluticasone (FLONASE) 50 MCG/ACT nasal spray USE ONE SPRAY IN EACH NOSTRIL TWICE DAILY  48 g  2  . ibuprofen (ADVIL,MOTRIN) 200 MG tablet Take 200 mg by mouth as needed.        Marland Kitchen KRILL OIL 1000 MG CAPS Take 1 capsule by mouth daily.      . mesalamine (CANASA) 1000 MG suppository Place 1 suppository (1,000 mg total) rectally at bedtime.  30 suppository  5  . mesalamine (ROWASA) 4 G enema Place 4 g rectally 3 times/day as needed-between  meals & bedtime.      Marland Kitchen oxyCODONE-acetaminophen (PERCOCET/ROXICET) 5-325 MG per tablet Take 1 tablet by mouth every 6 (six) hours as needed.  30 tablet  0  . SUMAtriptan (IMITREX) 100 MG tablet TAKE  ONE TABLET BY MOUTH EVERY 2 HOURS AS NEEDED FOR MIGRAINE  9 tablet  8  . Vitamin D, Ergocalciferol, (DRISDOL) 50000 UNITS CAPS TAKE ONE CAPSULE BY MOUTH WEEKLY  12 capsule  2  . [DISCONTINUED] glycopyrrolate (ROBINUL) 2 MG tablet Take 2 mg by mouth 2 (two) times daily.         No current facility-administered medications on file prior to visit.    BP 130/90  Pulse 58  Ht 5' 10"  (1.778 m)  Wt 173 lb (78.472 kg)  BMI 24.82 kg/m2  SpO2 98%chart     Objective:   Physical Exam  Constitutional: He is oriented to person, place, and time. He appears well-developed and well-nourished.  HENT:  Head: Normocephalic.  Right Ear: External ear normal.  Left Ear: External ear normal.  Nose: Nose normal.  Mouth/Throat: Oropharynx is clear and moist.  Eyes: Conjunctivae and EOM are normal. Pupils are equal, round, and reactive to light.  Neck: Normal range of motion. Neck supple.  Cardiovascular: Normal rate, regular rhythm and normal heart sounds.   Pulmonary/Chest: Effort normal and breath sounds normal.  Abdominal: Soft. Bowel sounds are normal. He exhibits no distension. There is no tenderness. There is no rebound.  Genitourinary: Rectum normal, prostate normal and penis normal. Guaiac negative stool. No penile tenderness.  Musculoskeletal: Normal range of motion.  Neurological: He is alert and oriented to person, place, and time. He has normal reflexes.  Skin: Skin is warm and dry.  Psychiatric: He has a normal mood and affect.          Assessment & Plan:  Assessment:  1. Complete physical exam-refer for colonoscopy screening with Dr. Doy Hutching. Encouraged healthy diet and exercise. Tetanus administered. 2. Allergic rhinitis-DC Claritin and try Zyrtec once daily. 3. Migraine headaches that  start amitriptyline 10 mg once daily at night. DC doxepin.   continue Triptans as as needed.

## 2013-11-01 NOTE — Progress Notes (Signed)
Pre visit review using our clinic review tool, if applicable. No additional management support is needed unless otherwise documented below in the visit note. 

## 2013-11-08 ENCOUNTER — Encounter: Payer: BC Managed Care – PPO | Admitting: Internal Medicine

## 2013-11-13 ENCOUNTER — Telehealth: Payer: Self-pay | Admitting: Gastroenterology

## 2013-11-13 NOTE — Telephone Encounter (Signed)
Per pt:  pt returned call from referral msg to get pt in for colonoscopy. Pt said he wants to be sure that Dr. Fuller Plan and Dr. Megan Salon have come to the decision that it is medically necessary for him to have colon. He said he was not having any sx.    Last COLON 12/01/10 with benign path, but it was done for UC surveillience; Recall for 3 years. Recall is in for 11/10/13. As stated above, pt is not having any symptoms and wants to know if it's necessary. Please advise. Thanks.

## 2013-11-13 NOTE — Telephone Encounter (Signed)
PCPs generally leave the decisions on managing UC to gastroenterologists. Standard national practice guidelines range from 1-3 years intervals for colonoscopies for UC surveillance in patients with disease longer than 8-10 years. I generally recommend every 2 years (mid point of 1-3). With patients who are doing extremely well, those who have little or no inflammation on biopsies and no UC symptoms, I occasionally will recommend a 3 year interval. For patients doing poorly I occasionally recommend 1 year intervals. Since his last biopsies were normal and if he has no UC symptoms, he can go to a 3 year interval this time if he wants to, which will be 11/2014. He can feel free to contact [place] for her opinion.

## 2013-11-14 ENCOUNTER — Encounter: Payer: Self-pay | Admitting: Family

## 2013-11-14 NOTE — Telephone Encounter (Signed)
Informed pt of Dr Lynne Leader remarks. Pt will call back later to schedule.

## 2013-11-14 NOTE — Telephone Encounter (Signed)
Given his time constrains would proceed with colonoscopy this spring or summer before he leaves.

## 2013-11-14 NOTE — Telephone Encounter (Signed)
Dr Fuller Plan pt understands your recommendations completely. Here's his problem. 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 Pt would like for you to determine when he should have his procedure; states he will go along with whatever you say. Thanks

## 2013-11-14 NOTE — Telephone Encounter (Signed)
lmom for pt to call back

## 2013-11-29 ENCOUNTER — Other Ambulatory Visit: Payer: Self-pay | Admitting: Internal Medicine

## 2013-12-13 ENCOUNTER — Encounter: Payer: Self-pay | Admitting: Internal Medicine

## 2014-01-03 ENCOUNTER — Other Ambulatory Visit: Payer: Self-pay | Admitting: Internal Medicine

## 2014-01-24 ENCOUNTER — Encounter: Payer: Self-pay | Admitting: Gastroenterology

## 2014-02-28 ENCOUNTER — Ambulatory Visit (AMBULATORY_SURGERY_CENTER): Payer: Self-pay | Admitting: *Deleted

## 2014-02-28 VITALS — Ht 71.0 in | Wt 170.2 lb

## 2014-02-28 DIAGNOSIS — K5190 Ulcerative colitis, unspecified, without complications: Secondary | ICD-10-CM

## 2014-02-28 NOTE — Progress Notes (Signed)
No egg or soy allergy  No home oxygen use or problems with anesthesia  No medications for weight loss taken  emmi information given\  No constipation problems per pt, Prepopik given

## 2014-03-03 ENCOUNTER — Other Ambulatory Visit: Payer: Self-pay | Admitting: Family

## 2014-03-10 ENCOUNTER — Encounter: Payer: Self-pay | Admitting: Gastroenterology

## 2014-03-25 ENCOUNTER — Telehealth: Payer: Self-pay | Admitting: Internal Medicine

## 2014-03-25 MED ORDER — SUMATRIPTAN SUCCINATE 100 MG PO TABS: ORAL_TABLET | ORAL | Status: DC

## 2014-03-25 MED ORDER — AMITRIPTYLINE HCL 10 MG PO TABS: ORAL_TABLET | ORAL | Status: DC

## 2014-03-25 MED ORDER — FLUTICASONE PROPIONATE 50 MCG/ACT NA SUSP: NASAL | Status: DC

## 2014-03-25 MED ORDER — BALSALAZIDE DISODIUM 750 MG PO CAPS: ORAL_CAPSULE | ORAL | Status: DC

## 2014-03-25 NOTE — Telephone Encounter (Signed)
[facility] is requesting 90 day re-fills on the following: SUMAtriptan (IMITREX) 100 MG tablet amitriptyline (ELAVIL) 10 MG tablet balsalazide (COLAZAL) 750 MG capsule fluticasone (FLONASE) 50 MCG/ACT nasal spray Vitamin D, Ergocalciferol, (DRISDOL) 50000 UNITS CAPS capsule

## 2014-03-25 NOTE — Telephone Encounter (Signed)
All prescriptions except vit d sent to [facility]. Pls advise on Vit. D.

## 2014-04-01 ENCOUNTER — Encounter: Payer: Self-pay | Admitting: Gastroenterology

## 2014-04-01 ENCOUNTER — Ambulatory Visit (AMBULATORY_SURGERY_CENTER): Payer: BC Managed Care – PPO | Admitting: Gastroenterology

## 2014-04-01 VITALS — BP 122/74 | HR 64 | Temp 96.3°F | Resp 10 | Ht 71.0 in | Wt 170.0 lb

## 2014-04-01 DIAGNOSIS — D126 Benign neoplasm of colon, unspecified: Secondary | ICD-10-CM

## 2014-04-01 DIAGNOSIS — D123 Benign neoplasm of transverse colon: Secondary | ICD-10-CM

## 2014-04-01 DIAGNOSIS — K5289 Other specified noninfective gastroenteritis and colitis: Secondary | ICD-10-CM

## 2014-04-01 DIAGNOSIS — K599 Functional intestinal disorder, unspecified: Secondary | ICD-10-CM

## 2014-04-01 DIAGNOSIS — K5190 Ulcerative colitis, unspecified, without complications: Secondary | ICD-10-CM

## 2014-04-01 MED ORDER — FLEET ENEMA 7-19 GM/118ML RE ENEM
1.0000 | ENEMA | Freq: Once | RECTAL | Status: AC
  Administered 2014-04-01: 1 via RECTAL

## 2014-04-01 MED ORDER — SODIUM CHLORIDE 0.9 % IV SOLN: 500.0000 mL | INTRAVENOUS | Status: DC

## 2014-04-01 NOTE — Op Note (Signed)
[place]  [address]. [place], [id number]   COLONOSCOPY PROCEDURE REPORT  PATIENT: [name], [name]  MR#: [id number] BIRTHDATE: 1961/03/29 , 53  yrs. old GENDER: Male ENDOSCOPIST: Ladene Artist, MD, [facility] PROCEDURE DATE:  04/01/2014 PROCEDURE:   Colonoscopy with biopsy and snare polypectomy First Screening Colonoscopy - Avg.  risk and is 50 yrs.  old or older - No.  Prior Negative Screening - Now for repeat screening. N/A  History of Adenoma - Now for follow-up colonoscopy & has been > or = to 3 yrs.  N/A  Polyps Removed Today? Yes. ASA CLASS:   Class II INDICATIONS:elevated risk screening and High risk patient with previously diagnosed UC. MEDICATIONS: MAC sedation, administered by CRNA and propofol (Diprivan) 381m IV DESCRIPTION OF PROCEDURE:   After the risks benefits and alternatives of the procedure were thoroughly explained, informed consent was obtained.  A digital rectal exam revealed no abnormalities of the rectum.   The LB [id number] endoscope was introduced through the anus and advanced to the cecum, which was identified by both the appendix and ileocecal valve. No adverse events experienced  with a tortuous colon.   The quality of the prep was Prepopik adequate after extensive rinsing and suctioning. The instrument was then slowly withdrawn as the colon was fully examined.  COLON FINDINGS: Abnormal mucosa was found in the rectum.  Multiple biopsies of the area were performed.   A sessile polyp measuring 6 mm in size was found in the transverse colon.  A polypectomy was performed with a cold snare.  The resection was complete and the polyp tissue was completely retrieved.   A normal appearing cecum, ileocecal valve, and appendiceal orifice were identified.  The ascending, hepatic flexure, transverse splenic flexure, descending, sigmoid colon and rectum appeared otherwise unremarkable.  No polyps or cancers were seen.  Multiple random  biopsies of the area were performed.  Retroflexed views revealed small internal hemorrhoids. The time to cecum=4 minutes 45 seconds.  Withdrawal time=17 minutes 35 seconds.  The scope was withdrawn and the procedure completed. COMPLICATIONS: There were no complications.  ENDOSCOPIC IMPRESSION: 1.   Abnormal mucosa in the rectum; multiple biopsies performed 2.   Sessile polyp measuring 6 mm in the transverse colon; polypectomy performed with a cold snare 3.   Normal colon; multiple random biopsies performed  RECOMMENDATIONS: 1.  Await pathology results 2.  Repeat Colonoscopy in 3 years, if no dysplasia, with a more extensive bowel prep  eSigned:  MLadene Artist MD, FWoodstock [facility] Center06/23/2015 9:10 AM

## 2014-04-01 NOTE — Progress Notes (Signed)
Called to room to assist during endoscopic procedure.  Patient ID and intended procedure confirmed with present staff. Received instructions for my participation in the procedure from the performing physician.  

## 2014-04-01 NOTE — Patient Instructions (Signed)
YOU HAD AN ENDOSCOPIC PROCEDURE TODAY AT [facility]: Refer to the procedure report that was given to you for any specific questions about what was found during the examination.  If the procedure report does not answer your questions, please call your gastroenterologist to clarify.  If you requested that your care partner not be given the details of your procedure findings, then the procedure report has been included in a sealed envelope for you to review at your convenience later.  YOU SHOULD EXPECT: Some feelings of bloating in the abdomen. Passage of more gas than usual.  Walking can help get rid of the air that was put into your GI tract during the procedure and reduce the bloating. If you had a lower endoscopy (such as a colonoscopy or flexible sigmoidoscopy) you may notice spotting of blood in your stool or on the toilet paper. If you underwent a bowel prep for your procedure, then you may not have a normal bowel movement for a few days.  DIET: Your first meal following the procedure should be a light meal and then it is ok to progress to your normal diet.  A half-sandwich or bowl of soup is an example of a good first meal.  Heavy or fried foods are harder to digest and may make you feel nauseous or bloated.  Likewise meals heavy in dairy and vegetables can cause extra gas to form and this can also increase the bloating.  Drink plenty of fluids but you should avoid alcoholic beverages for 24 hours.  ACTIVITY: Your care partner should take you home directly after the procedure.  You should plan to take it easy, moving slowly for the rest of the day.  You can resume normal activity the day after the procedure however you should NOT DRIVE or use heavy machinery for 24 hours (because of the sedation medicines used during the test).    SYMPTOMS TO REPORT IMMEDIATELY: A gastroenterologist can be reached at any hour.  During normal business hours, 8:30 AM to 5:00 PM Monday through Friday,  call [phone].  After hours and on weekends, please call the GI answering service at [phone] who will take a message and have the physician on call contact you.   Following lower endoscopy (colonoscopy or flexible sigmoidoscopy):  Excessive amounts of blood in the stool  Significant tenderness or worsening of abdominal pains  Swelling of the abdomen that is new, acute  Fever of 100F or higher    FOLLOW UP: If any biopsies were taken you will be contacted by phone or by letter within the next 1-3 weeks.  Call your gastroenterologist if you have not heard about the biopsies in 3 weeks.  Our staff will call the home number listed on your records the next business day following your procedure to check on you and address any questions or concerns that you may have at that time regarding the information given to you following your procedure. This is a courtesy call and so if there is no answer at the home number and we have not heard from you through the emergency physician on call, we will assume that you have returned to your regular daily activities without incident.  SIGNATURES/CONFIDENTIALITY: You and/or your care partner have signed paperwork which will be entered into your electronic medical record.  These signatures attest to the fact that that the information above on your After Visit Summary has been reviewed and is understood.  Full responsibility of the confidentiality  of this discharge information lies with you and/or your care-partner.   Polyp information given.  Await biopsy results. Most likely repeat colonoscopy in three years-Dr. Fuller Plan will advise you in a letter after pathology reports are reviewed.

## 2014-04-01 NOTE — Progress Notes (Signed)
Report to PACU, RN, vss, BBS= Clear.  

## 2014-04-02 ENCOUNTER — Telehealth: Payer: Self-pay | Admitting: *Deleted

## 2014-04-02 NOTE — Telephone Encounter (Signed)
  Follow up Call-  Call back number 04/01/2014  Post procedure Call Back phone  # [phone]  Permission to leave phone message Yes     Patient questions:  Do you have a fever, pain , or abdominal swelling? no Pain Score  0 *  Have you tolerated food without any problems? yes  Have you been able to return to your normal activities? yes  Do you have any questions about your discharge instructions: Diet   no Medications  no Follow up visit  no  Do you have questions or concerns about your Care? no  Actions: * If pain score is 4 or above: No action needed, pain <4.  Pt states after procedure, he passed liquid with a few blood clots.  None further noted now.  No complaints at this time

## 2014-04-13 ENCOUNTER — Encounter: Payer: Self-pay | Admitting: Gastroenterology

## 2014-04-14 ENCOUNTER — Encounter: Payer: Self-pay | Admitting: Family

## 2014-04-14 ENCOUNTER — Ambulatory Visit (INDEPENDENT_AMBULATORY_CARE_PROVIDER_SITE_OTHER): Payer: BC Managed Care – PPO | Admitting: Family

## 2014-04-14 VITALS — BP 130/76 | HR 82 | Temp 99.3°F | Ht 71.0 in | Wt 170.0 lb

## 2014-04-14 DIAGNOSIS — R0602 Shortness of breath: Secondary | ICD-10-CM

## 2014-04-14 LAB — COMPREHENSIVE METABOLIC PANEL
ALBUMIN: 4.8 g/dL (ref 3.5–5.2)
ALT: 11 U/L (ref 0–53)
AST: 17 U/L (ref 0–37)
Alkaline Phosphatase: 32 U/L — ABNORMAL LOW (ref 39–117)
BUN: 12 mg/dL (ref 6–23)
CALCIUM: 9.7 mg/dL (ref 8.4–10.5)
CO2: 31 meq/L (ref 19–32)
CREATININE: 1.1 mg/dL (ref 0.4–1.5)
Chloride: 101 mEq/L (ref 96–112)
GFR: 74.87 mL/min (ref >60.00)
Glucose, Bld: 92 mg/dL (ref 70–99)
POTASSIUM: 4.4 meq/L (ref 3.5–5.1)
Sodium: 139 mEq/L (ref 135–145)
Total Bilirubin: 1.1 mg/dL (ref 0.2–1.2)
Total Protein: 7.3 g/dL (ref 6.0–8.3)

## 2014-04-14 LAB — CBC WITH DIFFERENTIAL/PLATELET
Basophils Absolute: 0 10*3/uL (ref 0.0–0.1)
Basophils Relative: 0.2 % (ref 0.0–3.0)
EOS ABS: 0 10*3/uL (ref 0.0–0.7)
Eosinophils Relative: 0.2 % (ref 0.0–5.0)
HCT: 43.8 % (ref 39.0–52.0)
Hemoglobin: 15 g/dL (ref 13.0–17.0)
LYMPHS PCT: 18.3 % (ref 12.0–46.0)
Lymphs Abs: 1.4 10*3/uL (ref 0.7–4.0)
MCHC: 34.3 g/dL (ref 30.0–36.0)
MCV: 88.9 fl (ref 78.0–100.0)
MONO ABS: 0.6 10*3/uL (ref 0.1–1.0)
Monocytes Relative: 7.5 % (ref 3.0–12.0)
Neutro Abs: 5.8 10*3/uL (ref 1.4–7.7)
Neutrophils Relative %: 73.8 % (ref 43.0–77.0)
PLATELETS: 201 10*3/uL (ref 150.0–400.0)
RBC: 4.93 Mil/uL (ref 4.22–5.81)
RDW: 12.9 % (ref 11.5–15.5)
WBC: 7.8 10*3/uL (ref 4.0–10.5)

## 2014-04-14 LAB — TSH: TSH: 0.56 u[IU]/mL (ref 0.35–4.50)

## 2014-04-14 NOTE — Progress Notes (Signed)
Subjective:    Patient ID: [name], male    DOB: Jul 25, 1961, 53 y.o.   MRN: [id number]  HPI 53 year old white male, nonsmoker is in today with complaints of shortness of breath that occurred about a mile into his one a half mile walk 3 days ago. Patient was able to sit on a bench for about 15 minutes and get himself together continue his walk. Developed chest tightness, tightness in his throat and shortness of breath. Denied any chest pain, nausea, or vomiting. Reports a similar episode that occurred one day ago with walking. Patient reports he was able to reproduce mild chest pain to the left chest. Reports to him Benadryl and that helped.   Review of Systems  Constitutional: Negative.   HENT: Negative.   Respiratory: Negative.   Cardiovascular: Negative.   Gastrointestinal: Negative.   Endocrine: Negative.   Genitourinary: Negative.   Musculoskeletal: Negative.   Skin: Negative.   Allergic/Immunologic: Negative.   Neurological: Negative.   Psychiatric/Behavioral: Negative.    Past Medical History  Diagnosis Date  . Allergic rhinitis   . Migraines   . GERD (gastroesophageal reflux disease)   . Ulcerative proctosigmoiditis 2001  . Internal hemorrhoids   . Allergy   . Arthritis   . Heart murmur     tricuspid valve regurgitation    History   Social History  . Marital Status: Single    Spouse Name: Domestic partner    Number of Children: N/A  . Years of Education: N/A   Occupational History  . College professor    Social History Main Topics  . Smoking status: Never Smoker   . Smokeless tobacco: Never Used  . Alcohol Use: 0.0 oz/week     Comment: 1-2 a day  . Drug Use: No  . Sexual Activity: Not on file   Other Topics Concern  . Not on file   Social History Narrative  . No narrative on file    Past Surgical History  Procedure Laterality Date  . Eye muscle transplant      Bilateral  . Colonoscopy    . Upper gastrointestinal endoscopy      Family  History  Problem Relation Age of Onset  . Thyroid disease Father   . Heart disease Father     a fib, expanded aorta  . Arthritis Mother   . Colon cancer      Grandmother's Family (x 1 brother to grandmother and 1 sister to grandmother)  . Crohn's disease Paternal Aunt   . Crohn's disease Cousin   . Colon polyps Maternal Grandmother     Great Aunt, Great Uncle  . Heart disease Brother   . Esophageal cancer Neg Hx   . Stomach cancer Neg Hx   . Rectal cancer Neg Hx     Allergies  Allergen Reactions  . Codeine Nausea Only    Current Outpatient Prescriptions on File Prior to Visit  Medication Sig Dispense Refill  . amitriptyline (ELAVIL) 10 MG tablet TAKE ONE TABLET BY MOUTH NIGHTLY AT BEDTIME  30 tablet  5  . balsalazide (COLAZAL) 750 MG capsule TAKE THREE CAPSULES BY MOUTH THREE TIMES DAILY  810 capsule  1  . cetirizine (ZYRTEC) 10 MG tablet Take 10 mg by mouth daily.      . clobetasol (OLUX) 0.05 % topical foam Apply topically daily as needed.  50 g  11  . DiphenhydrAMINE HCl, Sleep, 25 MG TBDP Take 1 tablet (25 mg total) by mouth at bedtime.  30 each  6  . fluticasone (FLONASE) 50 MCG/ACT nasal spray Use one spray in each nostril twice daily  48 g  1  . ibuprofen (ADVIL,MOTRIN) 200 MG tablet Take 200 mg by mouth as needed.        Marland Kitchen KRILL OIL 1000 MG CAPS Take 1 capsule by mouth daily.      . mesalamine (CANASA) 1000 MG suppository Place 1 suppository (1,000 mg total) rectally at bedtime.  30 suppository  5  . mesalamine (ROWASA) 4 G enema Place 4 g rectally 3 times/day as needed-between meals & bedtime.      Marland Kitchen oxyCODONE-acetaminophen (PERCOCET/ROXICET) 5-325 MG per tablet Take 1 tablet by mouth every 6 (six) hours as needed.  30 tablet  0  . SUMAtriptan (IMITREX) 100 MG tablet Take one tablet by mouth every 2 hours as needed for migraine  27 tablet  1  . Vitamin D, Ergocalciferol, (DRISDOL) 50000 UNITS CAPS capsule TAKE ONE CAPSULE EVERY WEEK   12 capsule  1  . [DISCONTINUED]  glycopyrrolate (ROBINUL) 2 MG tablet Take 2 mg by mouth 2 (two) times daily.         No current facility-administered medications on file prior to visit.    BP 130/76  Pulse 82  Temp(Src) 99.3 F (37.4 C) (Oral)  Ht 5' 11"  (1.803 m)  Wt 170 lb (77.111 kg)  BMI 23.72 kg/m2  SpO2 97%chart    Objective:   Physical Exam  Constitutional: He is oriented to person, place, and time. He appears well-developed and well-nourished.  HENT:  Right Ear: External ear normal.  Left Ear: External ear normal.  Nose: Nose normal.  Mouth/Throat: Oropharynx is clear and moist.  Neck: Normal range of motion. Neck supple. No thyromegaly present.  Cardiovascular: Normal rate, regular rhythm and normal heart sounds.   Pulmonary/Chest: Effort normal and breath sounds normal.  Abdominal: Soft. Bowel sounds are normal.  Musculoskeletal: Normal range of motion.  Neurological: He is alert and oriented to person, place, and time.  Skin: Skin is warm and dry.  Psychiatric: He has a normal mood and affect.          Assessment & Plan:  [name] was seen today for no specified reason.  Diagnoses and associated orders for this visit:  Shortness of breath - EKG 12-Lead - CMP - TSH - CBC with Differential - Ambulatory referral to Cardiology   Call the office with any questions or concerns. Recheck as scheduled and as needed.

## 2014-04-14 NOTE — Patient Instructions (Signed)

## 2014-04-14 NOTE — Progress Notes (Signed)
Pre visit review using our clinic review tool, if applicable. No additional management support is needed unless otherwise documented below in the visit note. 

## 2014-04-15 ENCOUNTER — Ambulatory Visit (INDEPENDENT_AMBULATORY_CARE_PROVIDER_SITE_OTHER): Payer: BC Managed Care – PPO | Admitting: Internal Medicine

## 2014-04-15 ENCOUNTER — Encounter: Payer: Self-pay | Admitting: Internal Medicine

## 2014-04-15 VITALS — BP 132/86 | HR 72 | Ht 70.0 in | Wt 170.7 lb

## 2014-04-15 DIAGNOSIS — R0602 Shortness of breath: Secondary | ICD-10-CM

## 2014-04-15 DIAGNOSIS — E785 Hyperlipidemia, unspecified: Secondary | ICD-10-CM

## 2014-04-15 DIAGNOSIS — R0609 Other forms of dyspnea: Secondary | ICD-10-CM | POA: Insufficient documentation

## 2014-04-15 DIAGNOSIS — R0989 Other specified symptoms and signs involving the circulatory and respiratory systems: Secondary | ICD-10-CM

## 2014-04-15 NOTE — Patient Instructions (Signed)
Your physician has requested that you have an exercise tolerance test. For further information please visit [email or web address]. Please also follow instruction sheet, as given.  Your physician wants you to follow-up in: 1 year. You will receive a reminder letter in the mail two months in advance. If you don't receive a letter, please call our office to schedule the follow-up appointment.

## 2014-04-15 NOTE — Progress Notes (Signed)
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 He is here today for evaluation of shortness of breath. He reports that a few weeks ago he had an episode where he was walking in the morning. He developed a rash on his leg which he did not pay much attention to. Shortly after walking he noticed that he became short of breath and felt that he was having some tightness in his throat. The symptoms lasted for about 25 or 30 minutes. He took 2 Benadryl and put Benadryl topical cream on the rash in his leg. He does not report being bit.  Eventually the symptoms resolved. He's had no further episodes. He denies any exertional angina. He does have a history of dyslipidemia. Other history significant for ulcerative colitis which appears to be well-controlled. There is a family history of a father who has atrial fibrillation, but no other significant medical history the family.  PMHx:  Past Medical History  Diagnosis Date  . Allergic rhinitis   . Migraines   . GERD (gastroesophageal reflux disease)   . Ulcerative proctosigmoiditis 2001  . Internal hemorrhoids   . Allergy   . Arthritis   . Heart murmur     tricuspid valve regurgitation    Past Surgical History  Procedure Laterality Date  . Eye muscle transplant      Bilateral  . Colonoscopy    . Upper gastrointestinal endoscopy      FAMHx:  Family History  Problem Relation Age of Onset  . Thyroid disease Father   . Heart disease Father     a fib, expanded aorta  . Arthritis Mother   . Colon cancer      Grandmother's Family (x 1 brother to grandmother and 1 sister to grandmother)  . Crohn's disease Paternal Aunt   . Crohn's disease Cousin   . Colon polyps Maternal Grandmother     Great Aunt, Great Uncle  . Heart  disease Brother   . Esophageal cancer Neg Hx   . Stomach cancer Neg Hx   . Rectal cancer Neg Hx     SOCHx:   reports that he has never smoked. He has never used smokeless tobacco. He reports that he drinks alcohol. He reports that he does not use illicit drugs.  ALLERGIES:  Allergies  Allergen Reactions  . Citrus     Oral pain, rash in mouth  . Codeine Nausea Only  . Shellfish Allergy Nausea And Vomiting    scallops  . Salmon [Fish Allergy] Nausea Only and Rash    Especially smoked salmon    ROS: A comprehensive review of systems was negative except for: Respiratory: positive for dyspnea on exertion  HOME MEDS: Current Outpatient Prescriptions  Medication Sig Dispense Refill  . amitriptyline (ELAVIL) 10 MG tablet TAKE ONE TABLET BY MOUTH NIGHTLY AT BEDTIME  30 tablet  5  . balsalazide (COLAZAL) 750 MG capsule TAKE THREE CAPSULES BY MOUTH THREE TIMES DAILY  810 capsule  1  . cetirizine (ZYRTEC) 10 MG tablet Take 10 mg by mouth daily.      . clobetasol (OLUX) 0.05 % topical foam Apply topically daily as needed.  50 g  11  . DiphenhydrAMINE HCl, Sleep, 25 MG TBDP Take 1 tablet (25 mg  total) by mouth at bedtime.  30 each  6  . fluticasone (FLONASE) 50 MCG/ACT nasal spray Use one spray in each nostril twice daily  48 g  1  . ibuprofen (ADVIL,MOTRIN) 200 MG tablet Take 200 mg by mouth as needed.        Marland Kitchen KRILL OIL 1000 MG CAPS Take 1 capsule by mouth daily.      . mesalamine (CANASA) 1000 MG suppository Place 1 suppository (1,000 mg total) rectally at bedtime.  30 suppository  5  . mesalamine (ROWASA) 4 G enema Place 4 g rectally 3 times/day as needed-between meals & bedtime.      Marland Kitchen oxyCODONE-acetaminophen (PERCOCET/ROXICET) 5-325 MG per tablet Take 1 tablet by mouth every 6 (six) hours as needed.  30 tablet  0  . SUMAtriptan (IMITREX) 100 MG tablet Take one tablet by mouth every 2 hours as needed for migraine  27 tablet  1  . Vitamin D, Ergocalciferol, (DRISDOL) 50000 UNITS CAPS  capsule TAKE ONE CAPSULE EVERY WEEK   12 capsule  1  . [DISCONTINUED] glycopyrrolate (ROBINUL) 2 MG tablet Take 2 mg by mouth 2 (two) times daily.         No current facility-administered medications for this visit.    LABS/IMAGING: Results for orders placed in visit on 04/14/14 (from the past 48 hour(s))  COMPREHENSIVE METABOLIC PANEL     Status: Abnormal   Collection Time    04/14/14  3:44 PM      Result Value Ref Range   Sodium 139  135 - 145 mEq/L   Potassium 4.4  3.5 - 5.1 mEq/L   Chloride 101  96 - 112 mEq/L   CO2 31  19 - 32 mEq/L   Glucose, Bld 92  70 - 99 mg/dL   BUN 12  6 - 23 mg/dL   Creatinine, Ser 1.1  0.4 - 1.5 mg/dL   Total Bilirubin 1.1  0.2 - 1.2 mg/dL   Alkaline Phosphatase 32 (*) 39 - 117 U/L   AST 17  0 - 37 U/L   ALT 11  0 - 53 U/L   Total Protein 7.3  6.0 - 8.3 g/dL   Albumin 4.8  3.5 - 5.2 g/dL   Calcium 9.7  8.4 - 10.5 mg/dL   GFR 74.87  >60.00 mL/min  TSH     Status: None   Collection Time    04/14/14  3:44 PM      Result Value Ref Range   TSH 0.56  0.35 - 4.50 uIU/mL  CBC WITH DIFFERENTIAL     Status: None   Collection Time    04/14/14  3:44 PM      Result Value Ref Range   WBC 7.8  4.0 - 10.5 K/uL   RBC 4.93  4.22 - 5.81 Mil/uL   Hemoglobin 15.0  13.0 - 17.0 g/dL   HCT 43.8  39.0 - 52.0 %   MCV 88.9  78.0 - 100.0 fl   MCHC 34.3  30.0 - 36.0 g/dL   RDW 12.9  11.5 - 15.5 %   Platelets 201.0  150.0 - 400.0 K/uL   Neutrophils Relative % 73.8  43.0 - 77.0 %   Lymphocytes Relative 18.3  12.0 - 46.0 %   Monocytes Relative 7.5  3.0 - 12.0 %   Eosinophils Relative 0.2  0.0 - 5.0 %   Basophils Relative 0.2  0.0 - 3.0 %   Neutro Abs 5.8  1.4 - 7.7 K/uL  Lymphs Abs 1.4  0.7 - 4.0 K/uL   Monocytes Absolute 0.6  0.1 - 1.0 K/uL   Eosinophils Absolute 0.0  0.0 - 0.7 K/uL   Basophils Absolute 0.0  0.0 - 0.1 K/uL   No results found.  VITALS: BP 132/86  Pulse 72  Ht 5' 10"  (1.778 m)  Wt 170 lb 11.2 oz (77.429 kg)  BMI 24.49  kg/m2  EXAM: General appearance: alert and no distress Neck: no carotid bruit and no JVD Lungs: clear to auscultation bilaterally Heart: regular rate and rhythm, S1, S2 normal, no murmur, click, rub or gallop Abdomen: soft, non-tender; bowel sounds normal; no masses,  no organomegaly Extremities: extremities normal, atraumatic, no cyanosis or edema Pulses: 2+ and symmetric Skin: Skin color, texture, turgor normal. No rashes or lesions Neurologic: Grossly normal Psych: Normal  EKG: deferred  ASSESSMENT: 1. Dyspnea, possibly allergic  PLAN: 1.   Mr. Velazquez had a solitary episode of dyspnea, possibly related to an allergic reaction. He did seem to respond to antihistamines however very quickly, possibly faster than the medication would be absorbed. He has had no further episodes. He is able to exercise without any restrictions. I suspect this is not cardiac in origin however I would recommend a Bruce treadmill stress test to further evaluate his cardiovascular capacity. If this is normal it will be reassuring prior to him leaving the country for one year on sabbatical. I will plan to contact him with results of his exercise treadmill stress test.  Thank you as always for the kind referral. If his stress test is negative, followup with me can be as needed.  Pixie Casino, MD, [facility] Attending Cardiologist [facility]  HILTY,Kenneth C 04/15/2014, 7:16 PM

## 2014-04-18 ENCOUNTER — Telehealth (HOSPITAL_COMMUNITY): Payer: Self-pay

## 2014-04-18 ENCOUNTER — Telehealth: Payer: Self-pay | Admitting: Gastroenterology

## 2014-04-18 NOTE — Telephone Encounter (Signed)
Patient notified of response and he will call back for any additional questions or concerns

## 2014-04-18 NOTE — Telephone Encounter (Signed)
Dr. Fuller Plan according to the procedure report patient had a 6 mm polyp.  The pathology report does not indicate a type of polyp.  Patient is asking about what type of polyp it was.  Please advise

## 2014-04-18 NOTE — Telephone Encounter (Signed)
Although the path report does label it as a "polyp", specimen number 2 was most likely the "polyp" which was one piece of polypoid mucosa with some colitis activity.   Since the path report labeling did not label this as a polyp it was not as clear as it should be I will have this reviewed in [place].   The most important thing for him and his health is that there were no precancerous polyps and the colon tissue showed mild quiescent colitis without dysplasia in 3 specimens and 1 specimen was normal mucosa.

## 2014-04-21 ENCOUNTER — Ambulatory Visit: Payer: BC Managed Care – PPO | Admitting: Internal Medicine

## 2014-04-23 ENCOUNTER — Ambulatory Visit (HOSPITAL_COMMUNITY)
Admission: RE | Admit: 2014-04-23 | Discharge: 2014-04-23 | Disposition: A | Discharge status: Home / Self Care | Payer: BC Managed Care – PPO | Source: Ambulatory Visit | Attending: Internal Medicine | Admitting: Internal Medicine

## 2014-04-23 DIAGNOSIS — R0602 Shortness of breath: Secondary | ICD-10-CM | POA: Insufficient documentation

## 2014-04-23 NOTE — Telephone Encounter (Signed)
Patient notified that Dr. Fuller Plan spoke with Dr. Saralyn Pilar from pathology about wording of the pathology report.  The specimen container containing the polyp was mislabeled as a "biopsy".  The case was reviewed and the results are unchanged.  I have contacted the patient and updated him on the conversation.  He thanked me for the call and stated that "increased my confidence in the results and recommendations"

## 2014-04-23 NOTE — Procedures (Signed)
Exercise Treadmill Test   Test  Exercise Tolerance Test Ordering MD: Pixie Casino    Unique Test No: 1   Treadmill:  1  Indication for ETT: exertional dyspnea  Contraindication to ETT: No   Stress Modality: exercise - treadmill  Cardiac Imaging Performed: non   Protocol: standard Bruce - maximal  Max BP:  171/92  Max MPHR (bpm):  169 85% MPR (bpm):  143  MPHR obtained (bpm):  176 % MPHR obtained:  104  Reached 85% MPHR (min:sec):  7:20 Total Exercise Time (min-sec):  12  Workload in METS:  13.7 Borg Scale: 14  Reason ETT Terminated:  Mild Fatigue & SOB    ST Segment Analysis At Rest: normal ST segments - no evidence of significant ST depression With Exercise: no evidence of significant ST depression  Other Information Arrhythmia:  No Angina during ETT:  absent (0) Quality of ETT:  diagnostic  ETT Interpretation:  normal - no evidence of ischemia by ST analysis  Comments: Excellent exercise tolerance Upsloping ST segments No chest pain Normal BP response to exercise Duke Treadmill Score of +12  Pixie Casino, MD, [facility] Attending Cardiologist [place]

## 2014-04-23 NOTE — Telephone Encounter (Signed)
Encounter complete. 

## 2014-04-24 ENCOUNTER — Encounter: Payer: Self-pay | Admitting: Internal Medicine

## 2014-10-21 ENCOUNTER — Other Ambulatory Visit: Payer: Self-pay

## 2014-10-21 ENCOUNTER — Encounter: Payer: Self-pay | Admitting: Family

## 2014-10-21 MED ORDER — SUMATRIPTAN SUCCINATE 100 MG PO TABS: ORAL_TABLET | ORAL | Status: DC

## 2014-12-19 ENCOUNTER — Encounter: Payer: Self-pay | Admitting: Family

## 2014-12-23 ENCOUNTER — Other Ambulatory Visit: Payer: Self-pay

## 2014-12-23 MED ORDER — FLUTICASONE PROPIONATE 50 MCG/ACT NA SUSP: NASAL | Status: DC

## 2014-12-23 MED ORDER — AMITRIPTYLINE HCL 10 MG PO TABS: ORAL_TABLET | ORAL | Status: DC

## 2014-12-23 NOTE — Telephone Encounter (Signed)
Left a message for pt to call office to schedule an appt to establish care. Prescriptions sent to pharmacy.

## 2014-12-23 NOTE — Telephone Encounter (Signed)
Rx request for amitriptyline 10 mg tablets #90 and flonase nasal spray   Pharm:  [facility]  Pls advise.

## 2014-12-24 ENCOUNTER — Other Ambulatory Visit: Payer: Self-pay | Admitting: Family

## 2014-12-24 MED ORDER — FLUTICASONE PROPIONATE 50 MCG/ACT NA SUSP: NASAL | Status: DC

## 2014-12-24 MED ORDER — AMITRIPTYLINE HCL 10 MG PO TABS: ORAL_TABLET | ORAL | Status: DC

## 2014-12-25 ENCOUNTER — Other Ambulatory Visit: Payer: Self-pay

## 2014-12-25 MED ORDER — AMITRIPTYLINE HCL 10 MG PO TABS: ORAL_TABLET | ORAL | Status: DC

## 2014-12-25 MED ORDER — FLUTICASONE PROPIONATE 50 MCG/ACT NA SUSP: NASAL | Status: DC

## 2015-02-17 ENCOUNTER — Encounter: Payer: Self-pay | Admitting: Family

## 2015-02-26 NOTE — Telephone Encounter (Signed)
lmom for pt to call back

## 2015-03-03 NOTE — Telephone Encounter (Signed)
lmom for pt to call back

## 2015-04-05 ENCOUNTER — Other Ambulatory Visit: Payer: Self-pay | Admitting: Family

## 2015-04-16 ENCOUNTER — Encounter: Payer: Self-pay | Admitting: Adult Health

## 2015-04-16 ENCOUNTER — Ambulatory Visit (INDEPENDENT_AMBULATORY_CARE_PROVIDER_SITE_OTHER): Payer: BC Managed Care – PPO | Admitting: Adult Health

## 2015-04-16 VITALS — BP 104/80 | Temp 98.5°F | Ht 70.0 in | Wt 181.4 lb

## 2015-04-16 DIAGNOSIS — R635 Abnormal weight gain: Secondary | ICD-10-CM | POA: Diagnosis not present

## 2015-04-16 DIAGNOSIS — L819 Disorder of pigmentation, unspecified: Secondary | ICD-10-CM | POA: Diagnosis not present

## 2015-04-16 DIAGNOSIS — G43919 Migraine, unspecified, intractable, without status migrainosus: Secondary | ICD-10-CM

## 2015-04-16 MED ORDER — RIZATRIPTAN BENZOATE 10 MG PO TABS: 10.0000 mg | ORAL_TABLET | ORAL | Status: DC | PRN

## 2015-04-16 MED ORDER — OXYCODONE-ACETAMINOPHEN 5-325 MG PO TABS: 1.0000 | ORAL_TABLET | Freq: Four times a day (QID) | ORAL | Status: DC | PRN

## 2015-04-16 NOTE — Progress Notes (Signed)
Subjective:    Patient ID: [name], male    DOB: [birth year], 54 y.o.   MRN: [id number]  HPI -[name] presents to the office today with multiple problems [name] would like addressed since returning from a year sabbatical for in [place].    Headaches - [name] gets 10 pills of imitrex per  month and using them all. [name] is getting about 10 migraine precursors a month and one break through migraines. [name] is curious if there is any other medications [name] can try. [name] is also worried   about taking his low dose of amitrypytiline and imitrex due to a study [name] read about anticholinergic medications causing Dementia. [name] has a family history of Dementia.   [name] has had five pound weight gain per year since 2012. [name] is not working out every day, walks about 8000 steps per day and eats healthy with good portion control.   Has some " new spots on my body that I have just noticed."    Review of Systems  Constitutional: Negative.   Eyes: Negative.   Respiratory: Negative.   Cardiovascular: Negative.   Gastrointestinal: Negative.   Endocrine: Negative.   Musculoskeletal: Positive for neck pain. Negative for neck stiffness.  Skin: Positive for color change. Negative for rash and wound.  Neurological: Positive for headaches. Negative for dizziness, weakness and light-headedness.  Psychiatric/Behavioral: Negative.   All other systems reviewed and are negative.  Past Medical History  Diagnosis Date  . Allergic rhinitis   . Migraines   . GERD (gastroesophageal reflux disease)   . Ulcerative proctosigmoiditis 2001  . Internal hemorrhoids   . Allergy   . Arthritis   . Heart murmur     tricuspid valve regurgitation    History   Social History  . Marital Status: Single    Spouse Name: Domestic partner  . Number of Children: N/A  . Years of Education: N/A   Occupational History  . College professor    Social History Main Topics  . Smoking status: Never Smoker   . Smokeless tobacco: Never Used    . Alcohol Use: 0.0 oz/week     Comment: 1-2 a day  . Drug Use: No  . Sexual Activity: Not on file   Other Topics Concern  . Not on file   Social History Narrative    Past Surgical History  Procedure Laterality Date  . Eye muscle transplant      Bilateral  . Colonoscopy    . Upper gastrointestinal endoscopy      Family History  Problem Relation Age of Onset  . Thyroid disease Father   . Heart disease Father     a fib, expanded aorta  . Arthritis Mother   . Colon cancer      Grandmother's Family (x 1 brother to grandmother and 1 sister to grandmother)  . Crohn's disease Paternal Aunt   . Crohn's disease Cousin   . Colon polyps Maternal Grandmother     Great Aunt, Great Uncle  . Heart disease Brother   . Esophageal cancer Neg Hx   . Stomach cancer Neg Hx   . Rectal cancer Neg Hx     Allergies  Allergen Reactions  . Citrus     Oral pain, rash in mouth  . Codeine Nausea Only  . Shellfish Allergy Nausea And Vomiting    scallops  . Salmon [Fish Allergy] Nausea Only and Rash    Especially smoked salmon    Current Outpatient Prescriptions on File  Prior to Visit  Medication Sig Dispense Refill  . amitriptyline (ELAVIL) 10 MG tablet TAKE ONE TABLET BY MOUTH NIGHTLY AT BEDTIME 90 tablet 1  . balsalazide (COLAZAL) 750 MG capsule TAKE THREE CAPSULES BY MOUTH THREE TIMES DAILY 810 capsule 1  . cetirizine (ZYRTEC) 10 MG tablet Take 10 mg by mouth daily.    . clobetasol (OLUX) 0.05 % topical foam Apply topically daily as needed. 50 g 11  . DiphenhydrAMINE HCl, Sleep, 25 MG TBDP Take 1 tablet (25 mg total) by mouth at bedtime. 30 each 6  . fluticasone (FLONASE) 50 MCG/ACT nasal spray Use one spray in each nostril twice daily 48 g 1  . ibuprofen (ADVIL,MOTRIN) 200 MG tablet Take 200 mg by mouth as needed.      Marland Kitchen KRILL OIL 1000 MG CAPS Take 1 capsule by mouth daily.    . SUMAtriptan (IMITREX) 100 MG tablet TAKE 1 TABLET EVERY 2 HOURS AS NEEDED FOR MIGRAINE (NO FURTHER REFILLS  WITHOUT OFFICE VISIT) 27 tablet 0  . mesalamine (CANASA) 1000 MG suppository Place 1 suppository (1,000 mg total) rectally at bedtime. (Patient not taking: Reported on 04/16/2015) 30 suppository 5  . mesalamine (ROWASA) 4 G enema Place 4 g rectally 3 times/day as needed-between meals & bedtime.    . Vitamin D, Ergocalciferol, (DRISDOL) 50000 UNITS CAPS capsule TAKE ONE CAPSULE EVERY WEEK  12 capsule 1  . [DISCONTINUED] glycopyrrolate (ROBINUL) 2 MG tablet Take 2 mg by mouth 2 (two) times daily.       No current facility-administered medications on file prior to visit.    BP 104/80 mmHg  Temp(Src) 98.5 F (36.9 C) (Oral)  Ht 5' 10"  (1.778 m)  Wt 181 lb 6.4 oz (82.283 kg)  BMI 26.03 kg/m2       Objective:   Physical Exam  Constitutional: [name] is oriented to person, place, and time. [name] appears well-developed and well-nourished. No distress.  Cardiovascular: Normal rate, regular rhythm, normal heart sounds and intact distal pulses.  Exam reveals no gallop and no friction rub.   No murmur heard. Pulmonary/Chest: Effort normal and breath sounds normal. No respiratory distress. [name] has no wheezes. [name] has no rales. [name] exhibits no tenderness.  Musculoskeletal: Normal range of motion. [name] exhibits no edema or tenderness.  Neurological: [name] is alert and oriented to person, place, and time.  Skin: Skin is warm and dry. [name] is not diaphoretic.  1. Spot on left lower abdomen appears as keratosis  2. Spot on bridge of left inner aspect of nose appears as superficial blood vessels.   3. Area on right side of abdomen, appears as a lightly colored, non raised symmetrical nevi.   None of these areas appear concerned  Nursing note and vitals reviewed.      Assessment & Plan:  1. Intractable migraine, unspecified migraine type - rizatriptan (MAXALT) 10 MG tablet; Take 1 tablet (10 mg total) by mouth as needed for migraine. May repeat in 2 hours if needed  Dispense: 10 tablet; Refill: 6 -  oxyCODONE-acetaminophen (PERCOCET/ROXICET) 5-325 MG per tablet; Take 1 tablet by mouth every 6 (six) hours as needed.  Dispense: 30 tablet; Refill: 0 - Discontinue Imitrex - Follow up if no relief.  2. Weight gain - [name] does not appear overweight.  - [name] needs to change his exercise habits. I encouraged him to take up bike riding again or start swimming.   3. Discoloration of skin - No areas of concern - Continue to monitor.

## 2015-04-16 NOTE — Patient Instructions (Signed)
It was nice meeting you today.   I have sent a prescription to the pharmacy for the [place]- take this as you would the imitrex.   Follow up if you do not notice any improvement.

## 2015-06-01 ENCOUNTER — Encounter: Payer: BC Managed Care – PPO | Admitting: Family Medicine

## 2015-06-11 ENCOUNTER — Other Ambulatory Visit: Payer: Self-pay | Admitting: Family

## 2015-06-30 ENCOUNTER — Ambulatory Visit (INDEPENDENT_AMBULATORY_CARE_PROVIDER_SITE_OTHER): Payer: BC Managed Care – PPO | Admitting: Family Medicine

## 2015-06-30 ENCOUNTER — Encounter: Payer: Self-pay | Admitting: Family Medicine

## 2015-06-30 VITALS — BP 122/88 | HR 85 | Temp 98.5°F | Ht 70.0 in | Wt 180.8 lb

## 2015-06-30 DIAGNOSIS — Z23 Encounter for immunization: Secondary | ICD-10-CM | POA: Diagnosis not present

## 2015-06-30 DIAGNOSIS — Z0000 Encounter for general adult medical examination without abnormal findings: Secondary | ICD-10-CM | POA: Diagnosis not present

## 2015-06-30 LAB — LIPID PANEL
CHOL/HDL RATIO: 4
Cholesterol: 238 mg/dL — ABNORMAL HIGH (ref 0–200)
HDL: 65.4 mg/dL (ref >39.00)
LDL Cholesterol: 156 mg/dL — ABNORMAL HIGH (ref 0–99)
NONHDL: 172.91
Triglycerides: 84 mg/dL (ref 0.0–149.0)
VLDL: 16.8 mg/dL (ref 0.0–40.0)

## 2015-06-30 LAB — HEMOGLOBIN A1C: Hgb A1c MFr Bld: 5 % (ref 4.6–6.5)

## 2015-06-30 NOTE — Progress Notes (Signed)
Pre visit review using our clinic review tool, if applicable. No additional management support is needed unless otherwise documented below in the visit note. 

## 2015-06-30 NOTE — Progress Notes (Signed)
HPI:  Here for CPE. He would like to establish with Dr. Yong Channel, but needed his physical. He sees Dr. Fuller Plan for his UC. He has migraines and takes prn meds for this. He is having fewer migraines now - only about 4 per month. Changes in barometric pressure trigger these. He takes ibuprofen 3-4 times per week. On amitriptyline 55m nightly sometimes.   -Concerns and/or follow up today: none  -Diet: variety of foods, balance   -Exercise: regular exercise - 4 miles per day  -Diabetes and Dyslipidemia Screening: FASTING  -Hx of HTN: no  -Vaccines: flu vaccines  -sexual activity: one male partner  -wants STI testing, Hep C screening (if born 157-1963: yes  -FH colon or prstate ca: see FH Last colon cancer screening: sees GI Last prostate ca screening: discussed, declined  -Alcohol, Tobacco, drug use: see social history  Review of Systems - no fevers, unintentional weight loss, vision loss, hearing loss, chest pain, sob, hemoptysis, melena, hematochezia, hematuria, genital discharge, changing or concerning skin lesions, bleeding, bruising, loc, thoughts of self harm or SI  Past Medical History  Diagnosis Date  . Allergic rhinitis   . Migraines   . GERD (gastroesophageal reflux disease)   . Ulcerative proctosigmoiditis 2001  . Internal hemorrhoids   . Allergy   . Arthritis   . Heart murmur     tricuspid valve regurgitation    Past Surgical History  Procedure Laterality Date  . Eye muscle transplant      Bilateral  . Colonoscopy    . Upper gastrointestinal endoscopy      Family History  Problem Relation Age of Onset  . Thyroid disease Father   . Heart disease Father     a fib, expanded aorta  . Arthritis Mother   . Colon cancer      Grandmother's Family (x 1 brother to grandmother and 1 sister to grandmother)  . Crohn's disease Paternal Aunt   . Crohn's disease Cousin   . Colon polyps Maternal Grandmother     Great Aunt, Great Uncle  . Heart disease Brother   .  Esophageal cancer Neg Hx   . Stomach cancer Neg Hx   . Rectal cancer Neg Hx     Social History   Social History  . Marital Status: Single    Spouse Name: Domestic partner  . Number of Children: N/A  . Years of Education: N/A   Occupational History  . College professor    Social History Main Topics  . Smoking status: Never Smoker   . Smokeless tobacco: Never Used  . Alcohol Use: 0.0 oz/week     Comment: 1-2 a day  . Drug Use: No  . Sexual Activity: Not Asked   Other Topics Concern  . None   Social History Narrative     Current outpatient prescriptions:  .  amitriptyline (ELAVIL) 10 MG tablet, TAKE ONE TABLET BY MOUTH NIGHTLY AT BEDTIME, Disp: 90 tablet, Rfl: 1 .  balsalazide (COLAZAL) 750 MG capsule, TAKE THREE CAPSULES BY MOUTH THREE TIMES DAILY, Disp: 810 capsule, Rfl: 1 .  cetirizine (ZYRTEC) 10 MG tablet, Take 10 mg by mouth daily., Disp: , Rfl:  .  clobetasol (OLUX) 0.05 % topical foam, Apply topically daily as needed., Disp: 50 g, Rfl: 11 .  DiphenhydrAMINE HCl, Sleep, 25 MG TBDP, Take 1 tablet (25 mg total) by mouth at bedtime., Disp: 30 each, Rfl: 6 .  fluticasone (FLONASE) 50 MCG/ACT nasal spray, Use one spray in each nostril twice  daily, Disp: 48 g, Rfl: 1 .  fluticasone (FLONASE) 50 MCG/ACT nasal spray, USE 1 SPRAY IN EACH NOSTRIL TWICE A DAY, Disp: 48 g, Rfl: 0 .  ibuprofen (ADVIL,MOTRIN) 200 MG tablet, Take 200 mg by mouth as needed.  , Disp: , Rfl:  .  KRILL OIL 1000 MG CAPS, Take 1 capsule by mouth daily., Disp: , Rfl:  .  mesalamine (CANASA) 1000 MG suppository, Place 1 suppository (1,000 mg total) rectally at bedtime., Disp: 30 suppository, Rfl: 5 .  mesalamine (ROWASA) 4 G enema, Place 4 g rectally 3 times/day as needed-between meals & bedtime., Disp: , Rfl:  .  oxyCODONE-acetaminophen (PERCOCET/ROXICET) 5-325 MG per tablet, Take 1 tablet by mouth every 6 (six) hours as needed., Disp: 30 tablet, Rfl: 0 .  rizatriptan (MAXALT) 10 MG tablet, Take 1 tablet  (10 mg total) by mouth as needed for migraine. May repeat in 2 hours if needed, Disp: 10 tablet, Rfl: 6 .  SUMAtriptan (IMITREX) 100 MG tablet, TAKE 1 TABLET EVERY 2 HOURS AS NEEDED FOR MIGRAINE (NO FURTHER REFILLS WITHOUT OFFICE VISIT), Disp: 27 tablet, Rfl: 0 .  Vitamin D, Ergocalciferol, (DRISDOL) 50000 UNITS CAPS capsule, TAKE ONE CAPSULE EVERY WEEK , Disp: 12 capsule, Rfl: 1 .  [DISCONTINUED] glycopyrrolate (ROBINUL) 2 MG tablet, Take 2 mg by mouth 2 (two) times daily.  , Disp: , Rfl:   EXAM:  Filed Vitals:   06/30/15 0814  BP: 122/88  Pulse: 85  Temp: 98.5 F (36.9 C)  TempSrc: Oral  Height: 5' 10"  (1.778 m)  Weight: 180 lb 12.8 oz (82.01 kg)    Estimated body mass index is 25.94 kg/(m^2) as calculated from the following:   Height as of this encounter: 5' 10"  (1.778 m).   Weight as of this encounter: 180 lb 12.8 oz (82.01 kg).  GENERAL: vitals reviewed and listed below, alert, oriented, appears well hydrated and in no acute distress  HEENT: head atraumatic, PERRLA, normal appearance of eyes, ears, nose and mouth. moist mucus membranes.  NECK: supple, no masses or lymphadenopathy  LUNGS: clear to auscultation bilaterally, no rales, rhonchi or wheeze  CV: HRRR, no peripheral edema or cyanosis, normal pedal pulses  ABDOMEN: bowel sounds normal, soft, non tender to palpation, no masses, no rebound or guarding  GU: declined  SKIN: no rash or abnormal lesions  MS: normal gait, moves all extremities normally  NEURO: CN II-XII grossly intact, normal muscle strength and sensation to light touch on extremities  PSYCH: normal affect, pleasant and cooperative  ASSESSMENT AND PLAN:  Discussed the following assessment and plan:  Visit for preventive health examination - Plan: Lipid Panel, Hemoglobin A1c, HIV antibody (with reflex), Hep C Antibody  -Discussed and advised all [place] preventive services health task force level A and B recommendations for age, sex and  risks.  -Advised at least 150 minutes of exercise per week and a healthy diet low in saturated fats and sweets and consisting of fresh fruits and vegetables, lean meats such as fish and white chicken and whole grains.  -FASTING labs, studies and vaccines per orders this encounter   Patient advised to return to clinic immediately if symptoms worsen or persist or new concerns.  Patient Instructions  BEFORE YOU LEAVE: -flu shot -labs -schedule New Patient Visit with Dr. Yong Channel  -We have ordered labs or studies at this visit. It can take up to 1-2 weeks for results and processing. We will contact you with instructions IF your results are abnormal. Normal results  will be released to your [facility]. If you have not heard from [place] or can not find your results in [place] in 2 weeks please contact our office.  We recommend the following healthy lifestyle measures: - eat a healthy diet consisting of lots of vegetables, fruits, beans, nuts, seeds, healthy meats such as white chicken and fish and whole grains.  - avoid fried foods, fast food, processed foods, sodas, red meet and other fattening foods.  - get a least 150 minutes of aerobic exercise per week.       No Follow-up on file.   Colin Benton R.

## 2015-06-30 NOTE — Patient Instructions (Signed)
BEFORE YOU LEAVE: -flu shot -labs -schedule New Patient Visit with Dr. Yong Channel  -We have ordered labs or studies at this visit. It can take up to 1-2 weeks for results and processing. We will contact you with instructions IF your results are abnormal. Normal results will be released to your [facility]. If you have not heard from [place] or can not find your results in [facility] in 2 weeks please contact our office.  We recommend the following healthy lifestyle measures: - eat a healthy diet consisting of lots of vegetables, fruits, beans, nuts, seeds, healthy meats such as white chicken and fish and whole grains.  - avoid fried foods, fast food, processed foods, sodas, red meet and other fattening foods.  - get a least 150 minutes of aerobic exercise per week.

## 2015-07-01 LAB — HEPATITIS C ANTIBODY: HCV AB: NEGATIVE

## 2015-07-01 LAB — HIV ANTIBODY (ROUTINE TESTING W REFLEX): HIV: NONREACTIVE

## 2015-07-03 ENCOUNTER — Other Ambulatory Visit: Payer: Self-pay

## 2015-07-03 MED ORDER — VITAMIN D (ERGOCALCIFEROL) 1.25 MG (50000 UNIT) PO CAPS: ORAL_CAPSULE | ORAL | Status: DC

## 2015-07-03 MED ORDER — BALSALAZIDE DISODIUM 750 MG PO CAPS: ORAL_CAPSULE | ORAL | Status: DC

## 2015-09-16 ENCOUNTER — Other Ambulatory Visit: Payer: Self-pay | Admitting: Family

## 2015-10-06 ENCOUNTER — Other Ambulatory Visit: Payer: Self-pay

## 2015-10-06 ENCOUNTER — Telehealth: Payer: Self-pay

## 2015-10-06 MED ORDER — SUMATRIPTAN SUCCINATE 100 MG PO TABS: ORAL_TABLET | ORAL | Status: DC

## 2015-10-06 NOTE — Telephone Encounter (Signed)
Medication refilled

## 2015-10-06 NOTE — Telephone Encounter (Signed)
Refill request for Sumatriptan 144m last refilled on 04/06/15 and that said no further refills until OV. Pt is scheduled to est with you on 08/08/16. Please advise if refill ok.

## 2015-10-06 NOTE — Telephone Encounter (Signed)
Yes thanks. He has already had 2 visits with our office since that time.

## 2015-10-09 ENCOUNTER — Encounter: Payer: Self-pay | Admitting: Family Medicine

## 2015-10-09 ENCOUNTER — Ambulatory Visit (INDEPENDENT_AMBULATORY_CARE_PROVIDER_SITE_OTHER): Payer: BC Managed Care – PPO | Admitting: Family Medicine

## 2015-10-09 VITALS — BP 118/82 | HR 96 | Temp 98.6°F | Ht 70.0 in | Wt 182.0 lb

## 2015-10-09 DIAGNOSIS — J329 Chronic sinusitis, unspecified: Secondary | ICD-10-CM

## 2015-10-09 DIAGNOSIS — H6981 Other specified disorders of Eustachian tube, right ear: Secondary | ICD-10-CM | POA: Diagnosis not present

## 2015-10-09 NOTE — Progress Notes (Signed)
HPI:  Acute visit for:  Sore throat and ear ache: -started: 1-2 weeks ago then resolved, then returned a few days ago -symptoms:nasal congestion, scratchy throat, R ear popping and pain intermittently -denies:fever, SOB, NVD, tooth pain, sinus pain -has tried: on flonase and antihistamine for allergies and on experimental "non harmful" bacteria spray in nose for clinical trial - he stopped this (started this after symptoms started) -sick contacts/travel/risks: denies flu exposure, tick exposure or or Ebola risks -Hx of: allergies  ROS: See pertinent positives and negatives per HPI.  Past Medical History  Diagnosis Date  . Allergic rhinitis   . Migraines   . GERD (gastroesophageal reflux disease)   . Ulcerative proctosigmoiditis ([place]) 2001  . Internal hemorrhoids   . Allergy   . Arthritis   . Heart murmur     tricuspid valve regurgitation    Past Surgical History  Procedure Laterality Date  . Eye muscle transplant      Bilateral  . Colonoscopy    . Upper gastrointestinal endoscopy      Family History  Problem Relation Age of Onset  . Thyroid disease Father   . Heart disease Father     a fib, expanded aorta  . Arthritis Mother   . Colon cancer      Grandmother's Family (x 1 brother to grandmother and 1 sister to grandmother)  . Crohn's disease Paternal Aunt   . Crohn's disease Cousin   . Colon polyps Maternal Grandmother     Great Aunt, Great Uncle  . Heart disease Brother   . Esophageal cancer Neg Hx   . Stomach cancer Neg Hx   . Rectal cancer Neg Hx     Social History   Social History  . Marital Status: Single    Spouse Name: Domestic partner  . Number of Children: N/A  . Years of Education: N/A   Occupational History  . College professor    Social History Main Topics  . Smoking status: Never Smoker   . Smokeless tobacco: Never Used  . Alcohol Use: 0.0 oz/week     Comment: 1-2 a day  . Drug Use: No  . Sexual Activity: Not Asked   Other  Topics Concern  . None   Social History Narrative     Current outpatient prescriptions:  .  amitriptyline (ELAVIL) 10 MG tablet, TAKE 1 TABLET NIGHTLY AT BEDTIME, Disp: 90 tablet, Rfl: 0 .  balsalazide (COLAZAL) 750 MG capsule, TAKE THREE CAPSULES BY MOUTH THREE TIMES DAILY, Disp: 270 capsule, Rfl: 1 .  cetirizine (ZYRTEC) 10 MG tablet, Take 10 mg by mouth daily., Disp: , Rfl:  .  clobetasol (OLUX) 0.05 % topical foam, Apply topically daily as needed., Disp: 50 g, Rfl: 11 .  DiphenhydrAMINE HCl, Sleep, 25 MG TBDP, Take 1 tablet (25 mg total) by mouth at bedtime., Disp: 30 each, Rfl: 6 .  fluticasone (FLONASE) 50 MCG/ACT nasal spray, Use one spray in each nostril twice daily, Disp: 48 g, Rfl: 1 .  fluticasone (FLONASE) 50 MCG/ACT nasal spray, USE 1 SPRAY IN EACH NOSTRIL TWICE A DAY (NEED TO ESTABLISH WITH NEW PROVIDER), Disp: 48 g, Rfl: 0 .  ibuprofen (ADVIL,MOTRIN) 200 MG tablet, Take 200 mg by mouth as needed.  , Disp: , Rfl:  .  KRILL OIL 1000 MG CAPS, Take 1 capsule by mouth daily., Disp: , Rfl:  .  mesalamine (CANASA) 1000 MG suppository, Place 1 suppository (1,000 mg total) rectally at bedtime., Disp: 30 suppository, Rfl: 5 .  mesalamine (ROWASA) 4 G enema, Place 4 g rectally 3 times/day as needed-between meals & bedtime., Disp: , Rfl:  .  oxyCODONE-acetaminophen (PERCOCET/ROXICET) 5-325 MG per tablet, Take 1 tablet by mouth every 6 (six) hours as needed., Disp: 30 tablet, Rfl: 0 .  rizatriptan (MAXALT) 10 MG tablet, Take 1 tablet (10 mg total) by mouth as needed for migraine. May repeat in 2 hours if needed, Disp: 10 tablet, Rfl: 6 .  SUMAtriptan (IMITREX) 100 MG tablet, TAKE 1 TABLET EVERY 2 HOURS AS NEEDED FOR MIGRAINE (NO FURTHER REFILLS WITHOUT OFFICE VISIT), Disp: 27 tablet, Rfl: 0 .  Vitamin D, Ergocalciferol, (DRISDOL) 50000 UNITS CAPS capsule, TAKE ONE CAPSULE EVERY WEEK, Disp: 52 capsule, Rfl: 1 .  [DISCONTINUED] glycopyrrolate (ROBINUL) 2 MG tablet, Take 2 mg by mouth 2 (two)  times daily.  , Disp: , Rfl:   EXAM:  Filed Vitals:   10/09/15 1541  BP: 118/82  Pulse: 96  Temp: 98.6 F (37 C)    Body mass index is 26.11 kg/(m^2).  GENERAL: vitals reviewed and listed above, alert, oriented, appears well hydrated and in no acute distress  HEENT: atraumatic, conjunttiva clear, no obvious abnormalities on inspection of external nose and ears, normal appearance of ear canals and TMs, clear and yellow nasal congestion, mild post oropharyngeal erythema with PND, no tonsillar edema or exudate, no sinus TTP  NECK: no obvious masses on inspection  LUNGS: clear to auscultation bilaterally, no wheezes, rales or rhonchi, good air movement  CV: HRRR, no peripheral edema  MS: moves all extremities without noticeable abnormality  PSYCH: pleasant and cooperative, no obvious depression or anxiety  ASSESSMENT AND PLAN:  Discussed the following assessment and plan:  Rhinosinusitis  Eustachian tube dysfunction, right  -given HPI and exam findings today, a serious infection or illness is unlikely. We discussed potential etiologies, with VURI vs allergic rhinitis with eustachian tube dysfunction being most likely. We discussed treatment side effects, likely course, antibiotic misuse, transmission, and signs of developing a serious illness. -he has stopped the experimental nasal spray which I feel is wise -supportive care and advised low threshhold for re-eval and abx if worsening or not improving -of course, we advised to return or notify a doctor immediately if symptoms worsen or persist or new concerns arise.    Patient Instructions   -nasal saline wash 2-3 times daily (use prepackaged nasal saline or bottled/distilled water if making your own)   -can use AFRIN nasal spray for drainage and nasal congestion - but do NOT use longer then 3-4 days  -can use tylenol (in no history of liver disease) or ibuprofen (if no history of kidney disease, bowel bleeding or  significant heart disease) as directed for aches and sorethroat  -in the winter time, using a humidifier at night is helpful (please follow cleaning instructions)  -if you are taking a cough medication - use only as directed, may also try a teaspoon of honey to coat the throat and throat lozenges. If given a cough medication with codeine or hydrocodone or other narcotic please be advised that this contains a strong and  potentially addicting medication. Please follow instructions carefully, take as little as possible and only use AS NEEDED for severe cough. Discuss potential side effects with your pharmacy. Please do not drive or operate machinery while taking these types of medications. Please do not take other sedating medications, drugs or alcohol while taking this medication without discussing with your doctor.  -for sore throat, salt water gargles can  help  -follow up if you have fevers, facial pain, tooth pain, difficulty breathing or are worsening or symptoms persist longer then expected      Roy Baity R.

## 2015-10-09 NOTE — Patient Instructions (Signed)
-  nasal saline wash 2-3 times daily (use prepackaged nasal saline or bottled/distilled water if making your own)   -can use AFRIN nasal spray for drainage and nasal congestion - but do NOT use longer then 3-4 days  -can use tylenol (in no history of liver disease) or ibuprofen (if no history of kidney disease, bowel bleeding or significant heart disease) as directed for aches and sorethroat  -in the winter time, using a humidifier at night is helpful (please follow cleaning instructions)  -if you are taking a cough medication - use only as directed, may also try a teaspoon of honey to coat the throat and throat lozenges. If given a cough medication with codeine or hydrocodone or other narcotic please be advised that this contains a strong and  potentially addicting medication. Please follow instructions carefully, take as little as possible and only use AS NEEDED for severe cough. Discuss potential side effects with your pharmacy. Please do not drive or operate machinery while taking these types of medications. Please do not take other sedating medications, drugs or alcohol while taking this medication without discussing with your doctor.  -for sore throat, salt water gargles can help  -follow up if you have fevers, facial pain, tooth pain, difficulty breathing or are worsening or symptoms persist longer then expected

## 2015-10-09 NOTE — Progress Notes (Signed)
Pre visit review using our clinic review tool, if applicable. No additional management support is needed unless otherwise documented below in the visit note. 

## 2015-12-14 ENCOUNTER — Encounter: Payer: Self-pay | Admitting: Gastroenterology

## 2015-12-17 ENCOUNTER — Other Ambulatory Visit: Payer: Self-pay | Admitting: Family

## 2015-12-17 ENCOUNTER — Other Ambulatory Visit: Payer: Self-pay | Admitting: Family Medicine

## 2015-12-17 MED ORDER — SUMATRIPTAN SUCCINATE 100 MG PO TABS: ORAL_TABLET | ORAL | Status: DC

## 2015-12-17 MED ORDER — AMITRIPTYLINE HCL 10 MG PO TABS: 10.0000 mg | ORAL_TABLET | Freq: Every day | ORAL | Status: DC

## 2015-12-18 ENCOUNTER — Encounter: Payer: Self-pay | Admitting: Adult Health

## 2015-12-18 NOTE — Telephone Encounter (Signed)
Can we refill this? 

## 2015-12-18 NOTE — Telephone Encounter (Signed)
This is a patient of Cory's

## 2015-12-20 ENCOUNTER — Encounter: Payer: Self-pay | Admitting: Adult Health

## 2015-12-21 MED ORDER — VITAMIN D (ERGOCALCIFEROL) 1.25 MG (50000 UNIT) PO CAPS: ORAL_CAPSULE | ORAL | Status: DC

## 2015-12-21 NOTE — Telephone Encounter (Signed)
Rx sent to pharmacy for 90 day supply.

## 2015-12-21 NOTE — Telephone Encounter (Signed)
Ok to refill 

## 2015-12-22 ENCOUNTER — Other Ambulatory Visit: Payer: Self-pay | Admitting: Adult Health

## 2015-12-22 MED ORDER — BALSALAZIDE DISODIUM 750 MG PO CAPS: ORAL_CAPSULE | ORAL | Status: DC

## 2016-02-08 ENCOUNTER — Ambulatory Visit: Payer: Self-pay | Admitting: Family Medicine

## 2016-02-09 ENCOUNTER — Ambulatory Visit (INDEPENDENT_AMBULATORY_CARE_PROVIDER_SITE_OTHER): Payer: BC Managed Care – PPO | Admitting: Family Medicine

## 2016-02-09 ENCOUNTER — Encounter: Payer: Self-pay | Admitting: Family Medicine

## 2016-02-09 VITALS — BP 124/92 | HR 74 | Temp 98.6°F | Wt 178.0 lb

## 2016-02-09 DIAGNOSIS — J309 Allergic rhinitis, unspecified: Secondary | ICD-10-CM

## 2016-02-09 DIAGNOSIS — K5150 Left sided colitis without complications: Secondary | ICD-10-CM | POA: Diagnosis not present

## 2016-02-09 DIAGNOSIS — M545 Low back pain, unspecified: Secondary | ICD-10-CM | POA: Insufficient documentation

## 2016-02-09 DIAGNOSIS — G43919 Migraine, unspecified, intractable, without status migrainosus: Secondary | ICD-10-CM

## 2016-02-09 MED ORDER — AMITRIPTYLINE HCL 25 MG PO TABS: 25.0000 mg | ORAL_TABLET | Freq: Every day | ORAL | Status: DC

## 2016-02-09 MED ORDER — CYCLOBENZAPRINE HCL 10 MG PO TABS: 10.0000 mg | ORAL_TABLET | Freq: Three times a day (TID) | ORAL | Status: DC | PRN

## 2016-02-09 MED ORDER — FLUTICASONE PROPIONATE 50 MCG/ACT NA SUSP: NASAL | Status: DC

## 2016-02-09 MED ORDER — ALBUTEROL SULFATE HFA 108 (90 BASE) MCG/ACT IN AERS: 2.0000 | INHALATION_SPRAY | Freq: Four times a day (QID) | RESPIRATORY_TRACT | Status: DC | PRN

## 2016-02-09 MED ORDER — SUMATRIPTAN SUCCINATE 100 MG PO TABS: ORAL_TABLET | ORAL | Status: DC

## 2016-02-09 MED ORDER — OXYCODONE-ACETAMINOPHEN 5-325 MG PO TABS: 1.0000 | ORAL_TABLET | Freq: Four times a day (QID) | ORAL | Status: DC | PRN

## 2016-02-09 MED ORDER — BALSALAZIDE DISODIUM 750 MG PO CAPS: ORAL_CAPSULE | ORAL | Status: DC

## 2016-02-09 MED ORDER — MESALAMINE 1000 MG RE SUPP: 1000.0000 mg | Freq: Every day | RECTAL | Status: DC

## 2016-02-09 NOTE — Patient Instructions (Signed)
Refilled flexeril for as needed use for muscle tightness/back spasms. Love that you are getting a massage today. Refilled oxycodone with expectation this should last at least a year.   Trial amitriptyline 15m for migraine prophylaxis. Refilled imitrex  Refilled Canasa and colazal- future refills through GI. Referring today. We will call you within a week about your referral to GI. If you do not hear within 2 weeks, give [facility] call.

## 2016-02-09 NOTE — Assessment & Plan Note (Addendum)
S: used to have shots for allergies years ago. broke out in rash across his chest 2-3 weeks ago, mostly cleared up at this point with hydrocortisone. Used to have clobetasol for similar outbreaks for seborrheic dermatitis. At height of allergy season did have a 30 minute period where felt tightness in chest and some shortness of breath while resting on couch. Prior PFTs no asthma but did get inhaler- helped in past. Compliant with flonase and zyrtec. Had some shortness of breath about 2 years ago and had normal exercise stress test and has no chest pain or exertional component this time.  A/P: continue flonase and zyrtec. Did provide inhaler to see if it makes a difference during his episodes of breathing issues when allergies flared up. May need to refer back to allergist for repeat PFTs.

## 2016-02-09 NOTE — Progress Notes (Signed)
Subjective:  [name] is a 55 y.o. year old very pleasant male patient who presents for/with See problem oriented charting ROS- no fever, chills, nausea, vomiting. Does have sneezing and watery itchy eyes at times despite zyrtec/flonase.   Past Medical History-  Patient Active Problem List   Diagnosis Date Noted  . ULCERATIVE COLITIS-LEFT SIDE 05/06/2008    Priority: High  . 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 HYPERLIPIDEMIA 05/15/2007    Priority: Medium  . Migraines 05/03/2007    Priority: Medium  . Low back pain 02/09/2016    Priority: Low  . 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 VITAMIN D DEFICIENCY 10/27/2009    Priority: Low  . GERD 01/31/2008    Priority: Low  . INTERNAL HEMORRHOIDS 01/23/2008    Priority: Low  . Allergic rhinitis 05/03/2007    Priority: Low  . DOE (dyspnea on exertion) 04/15/2014    Medications- reviewed and updated Current Outpatient Prescriptions  Medication Sig Dispense Refill  . amitriptyline (ELAVIL) 25 MG tablet Take 1 tablet (25 mg total) by mouth at bedtime. 90 tablet 3  . balsalazide (COLAZAL) 750 MG capsule TAKE THREE CAPSULES BY MOUTH THREE TIMES DAILY 270 capsule 0  . cetirizine (ZYRTEC) 10 MG tablet Take 10 mg by mouth daily.    . fluticasone (FLONASE) 50 MCG/ACT nasal spray Use one spray in each nostril twice daily 48 g 3  . ibuprofen (ADVIL,MOTRIN) 200 MG tablet Take 200 mg by mouth as needed.      Marland Kitchen KRILL OIL 1000 MG CAPS Take 1 capsule by mouth daily.    . Vitamin D, Ergocalciferol, (DRISDOL) 50000 units CAPS capsule TAKE ONE CAPSULE EVERY WEEK 52 capsule 0  . albuterol (PROVENTIL HFA;VENTOLIN HFA) 108 (90 Base) MCG/ACT inhaler Inhale 2 puffs into the lungs every 6 (six) hours as needed for wheezing or shortness of breath. 1 Inhaler 0  . cyclobenzaprine (FLEXERIL) 10 MG tablet Take 1 tablet (10 mg total) by mouth 3 (three) times daily as needed for muscle spasms. 30 tablet 0   . mesalamine (CANASA) 1000 MG suppository Place 1 suppository (1,000 mg total) rectally at bedtime. 90 suppository 0  . oxyCODONE-acetaminophen (PERCOCET/ROXICET) 5-325 MG tablet Take 1 tablet by mouth every 6 (six) hours as needed for severe pain (for migraine not controlled by imitrex). 5 tablet 0  . SUMAtriptan (IMITREX) 100 MG tablet TAKE 1 TABLET EVERY 2 HOURS AS NEEDED FOR MIGRAINE (2 max per day) 30 tablet 3  . [DISCONTINUED] glycopyrrolate (ROBINUL) 2 MG tablet Take 2 mg by mouth 2 (two) times daily.       No current facility-administered medications for this visit.    Objective: BP 124/92 mmHg  Pulse 74  Temp(Src) 98.6 F (37 C)  Wt 178 lb (80.74 kg) Gen: NAD, resting comfortably CV: RRR no murmurs rubs or gallops Lungs: CTAB no crackles, wheeze, rhonchi Abdomen: soft/nontender/nondistended/normal bowel sounds. No rebound or guarding.  Ext: no edema Skin: warm, dry Neuro: grossly normal, moves all extremities  Assessment/Plan:  ULCERATIVE COLITIS-LEFT SIDE S: no recent rectal bleeding, has had some anal itching- used canasa recently. Colazal takes twice in morning, twice in evening. when flaring takes 3 capsules 3x a day. No recent flare.  A/P: I refilled both medications but placed referral to GI. Well over a year since patient has been seen. I was very clear with patient that this disease process should be managed by GI and  not primary care.   Migraines S: Migraines for over 20 years. amitriptyline helps for sleep, unclear if benefit on migraines. Still having 3x a week migraines. Tried maxalt instead of imitrex with no real difference- would like to change back to imitrex. Oxycodone for severe migraines not controlled with maxalt usually once a year. Patient states he lost his last #30 oxycodone when he was traveling.  A/P:  Trial increased amitriptyline to 38m to see if can reduce from 3x a week, if not can go back to 168mand consider beta blocker instead. I did provide  #5 oxycodone and clear that this should last a year at minimum.   Allergic rhinitis S: used to have shots for allergies years ago. broke out in rash across his chest 2-3 weeks ago, mostly cleared up at this point with hydrocortisone. Used to have clobetasol for similar outbreaks for seborrheic dermatitis. At height of allergy season did have a 30 minute period where felt tightness in chest and some shortness of breath while resting on couch. Prior PFTs no asthma but did get inhaler- helped in past. Compliant with flonase and zyrtec. Had some shortness of breath about 2 years ago and had normal exercise stress test and has no chest pain or exertional component this time.  A/P: continue flonase and zyrtec. Did provide inhaler to see if it makes a difference during his episodes of breathing issues when allergies flared up. May need to refer back to allergist for repeat PFTs.   Low back pain S: moderate low back pain starting with exertion from day before in the garden. Had 1 flexeril left and helped some. Usually lasts 2-3 and muscle relaxants are beneficial.  A/P: provided flexeril for prn use, advised massage therapy which he has later today fortunately.    Has October visit- will be CPE- need to review full history still. If acute issues- will need to schedule follow up visit. Return precautions advised.   Orders Placed This Encounter  Procedures  . Ambulatory referral to Gastroenterology    Referral Priority:  Routine    Referral Type:  Consultation    Referral Reason:  Specialty Services Required    Number of Visits Requested:  1    Meds ordered this encounter  Medications  . albuterol (PROVENTIL HFA;VENTOLIN HFA) 108 (90 Base) MCG/ACT inhaler    Sig: Inhale 2 puffs into the lungs every 6 (six) hours as needed for wheezing or shortness of breath.    Dispense:  1 Inhaler    Refill:  0  . cyclobenzaprine (FLEXERIL) 10 MG tablet    Sig: Take 1 tablet (10 mg total) by mouth 3 (three) times  daily as needed for muscle spasms.    Dispense:  30 tablet    Refill:  0  . mesalamine (CANASA) 1000 MG suppository    Sig: Place 1 suppository (1,000 mg total) rectally at bedtime.    Dispense:  90 suppository    Refill:  0  . amitriptyline (ELAVIL) 25 MG tablet    Sig: Take 1 tablet (25 mg total) by mouth at bedtime.    Dispense:  90 tablet    Refill:  3  . balsalazide (COLAZAL) 750 MG capsule    Sig: TAKE THREE CAPSULES BY MOUTH THREE TIMES DAILY    Dispense:  270 capsule    Refill:  0    Future refills need to be through GI- referring today  . fluticasone (FLONASE) 50 MCG/ACT nasal spray    Sig: Use  one spray in each nostril twice daily    Dispense:  48 g    Refill:  3    Please give 90 day supply.  . SUMAtriptan (IMITREX) 100 MG tablet    Sig: TAKE 1 TABLET EVERY 2 HOURS AS NEEDED FOR MIGRAINE (2 max per day)    Dispense:  30 tablet    Refill:  3    NEEDS OV  . oxyCODONE-acetaminophen (PERCOCET/ROXICET) 5-325 MG tablet    Sig: Take 1 tablet by mouth every 6 (six) hours as needed for severe pain (for migraine not controlled by imitrex).    Dispense:  5 tablet    Refill:  0    [name], MD

## 2016-02-09 NOTE — Assessment & Plan Note (Addendum)
S: Migraines for over 20 years. amitriptyline helps for sleep, unclear if benefit on migraines. Still having 3x a week migraines. Tried maxalt instead of imitrex with no real difference- would like to change back to imitrex. Oxycodone for severe migraines not controlled with maxalt usually once a year. Patient states he lost his last #30 oxycodone when he was traveling.  A/P:  Trial increased amitriptyline to 67m to see if can reduce from 3x a week, if not can go back to 127mand consider beta blocker instead. I did provide #5 oxycodone and clear that this should last a year at minimum.

## 2016-02-09 NOTE — Assessment & Plan Note (Signed)
S: no recent rectal bleeding, has had some anal itching- used canasa recently. Colazal takes twice in morning, twice in evening. when flaring takes 3 capsules 3x a day. No recent flare.  A/P: I refilled both medications but placed referral to GI. Well over a year since patient has been seen. I was very clear with patient that this disease process should be managed by GI and not primary care.

## 2016-02-09 NOTE — Assessment & Plan Note (Signed)
S: moderate low back pain starting with exertion from day before in the garden. Had 1 flexeril left and helped some. Usually lasts 2-3 and muscle relaxants are beneficial.  A/P: provided flexeril for prn use, advised massage therapy which he has later today fortunately.

## 2016-02-11 ENCOUNTER — Telehealth: Payer: Self-pay | Admitting: Family Medicine

## 2016-02-11 NOTE — Telephone Encounter (Signed)
PA was done on Cover My Meds. Response will be faxed with 5 business days

## 2016-02-15 ENCOUNTER — Encounter: Payer: Self-pay | Admitting: Family Medicine

## 2016-02-15 NOTE — Telephone Encounter (Signed)
I will approve- Bevelyn Ngo can you see what patient needs for this to be approved?

## 2016-02-16 ENCOUNTER — Telehealth: Payer: Self-pay | Admitting: Internal Medicine

## 2016-02-16 MED ORDER — SUMATRIPTAN SUCCINATE 100 MG PO TABS: ORAL_TABLET | ORAL | Status: DC

## 2016-02-16 NOTE — Telephone Encounter (Signed)
Did they give any options on what would be covered [place]?

## 2016-02-16 NOTE — Telephone Encounter (Signed)
Pt is aware can take up to 5 business day

## 2016-02-16 NOTE — Telephone Encounter (Signed)
Pt is aware.  

## 2016-02-16 NOTE — Telephone Encounter (Signed)
Error/njr °

## 2016-02-16 NOTE — Telephone Encounter (Signed)
Pt would like keba to return his call

## 2016-02-16 NOTE — Telephone Encounter (Addendum)
keba pt said the problem is we call  incorrect amount in. Pt needs new rx sumatriptan 100 mg #[facility] [address]

## 2016-02-16 NOTE — Telephone Encounter (Signed)
I don't know. Someone who has access to Cover My Meds in your office can look to see if there is any alternative that can be used. If he did not get it in the letter patient may need to call insurance company to find out what they recommend patient to try.

## 2016-02-16 NOTE — Telephone Encounter (Signed)
Pt stated he received a letter that imitrex was denied. This pt did not received a list of alternative. [place] 02[place]16 for 5 weeks and would like med before leaving. Please advise

## 2016-02-16 NOTE — Addendum Note (Signed)
Addended by: Clyde Lundborg A on: 02/16/2016 03:47 PM   Modules accepted: Orders

## 2016-02-16 NOTE — Telephone Encounter (Signed)
Medication sent in. 

## 2016-02-25 ENCOUNTER — Telehealth: Payer: Self-pay | Admitting: Internal Medicine

## 2016-02-25 NOTE — Telephone Encounter (Signed)
He has migraines and he is on a prophylactic medicine (amitriptyline) which we are titrating up

## 2016-02-25 NOTE — Telephone Encounter (Signed)
PA for Imitrex is denied. Patient must have all of the following: Dx of migraines, taking or unable to take a preventative migraine medication (ie divalproex)

## 2016-03-09 ENCOUNTER — Other Ambulatory Visit: Payer: Self-pay | Admitting: Family Medicine

## 2016-03-29 ENCOUNTER — Other Ambulatory Visit: Payer: Self-pay | Admitting: Adult Health

## 2016-04-04 ENCOUNTER — Telehealth: Payer: Self-pay | Admitting: Family Medicine

## 2016-04-04 NOTE — Telephone Encounter (Signed)
Refill request for Balsalazide 750 mg, Sumatriptan 100 mg, Fluticasone 50 mcg spray, Amitriptylin 25 mg and send to [facility] mail order.

## 2016-04-05 ENCOUNTER — Other Ambulatory Visit: Payer: Self-pay

## 2016-04-05 MED ORDER — SUMATRIPTAN SUCCINATE 100 MG PO TABS: ORAL_TABLET | ORAL | Status: DC

## 2016-04-05 MED ORDER — AMITRIPTYLINE HCL 25 MG PO TABS: 25.0000 mg | ORAL_TABLET | Freq: Every day | ORAL | Status: DC

## 2016-04-05 MED ORDER — FLUTICASONE PROPIONATE 50 MCG/ACT NA SUSP: NASAL | Status: DC

## 2016-04-05 MED ORDER — BALSALAZIDE DISODIUM 750 MG PO CAPS: 2250.0000 mg | ORAL_CAPSULE | Freq: Three times a day (TID) | ORAL | Status: DC

## 2016-04-07 ENCOUNTER — Ambulatory Visit (INDEPENDENT_AMBULATORY_CARE_PROVIDER_SITE_OTHER): Payer: BC Managed Care – PPO | Admitting: Gastroenterology

## 2016-04-07 ENCOUNTER — Encounter: Payer: Self-pay | Admitting: Gastroenterology

## 2016-04-07 VITALS — BP 130/88 | HR 92 | Ht 70.0 in | Wt 181.0 lb

## 2016-04-07 DIAGNOSIS — K5150 Left sided colitis without complications: Secondary | ICD-10-CM

## 2016-04-07 DIAGNOSIS — K5900 Constipation, unspecified: Secondary | ICD-10-CM

## 2016-04-07 NOTE — Progress Notes (Signed)
    History of Present Illness: This is a 55 year old male with a long-standing history of left-sided ulcerative colitis. He returns today complaining of occasional mild constipation and when he is constipated he notes small amounts of mucus per rectum. He occasionally has some mild anal itching that he treats with hydrocortisone cream with prompt symptom relief. He has no other GI complaints and specifically denies diarrhea, hematochezia, melena, abdominal pain, rectal pain, weight loss. He receives all his medications through his PCP.  Current Medications, Allergies, Past Medical History, Past Surgical History, Family History and Social History were reviewed in [employer] record.  Physical Exam: General: Well developed, well nourished, no acute distress Head: Normocephalic and atraumatic Eyes:  sclerae anicteric, EOMI Ears: Normal auditory acuity Mouth: No deformity or lesions Lungs: Clear throughout to auscultation Heart: Regular rate and rhythm; no murmurs, rubs or bruits Abdomen: Soft, non tender and non distended. No masses, hepatosplenomegaly or hernias noted. Normal Bowel sounds Musculoskeletal: Symmetrical with no gross deformities  Pulses:  Normal pulses noted Extremities: No clubbing, cyanosis, edema or deformities noted Neurological: Alert oriented x 4, grossly nonfocal Psychological:  Alert and cooperative. Normal mood and affect  Assessment and Recommendations:

## 2016-04-07 NOTE — Assessment & Plan Note (Addendum)
Increase dietary fiber and water intake for mild constipation. If this does not improve his constipation consider taking a daily probiotic or a daily stool softener. Patient is reassured small amounts of mucus with constipation is not unusual. Continue balsalazide at current dosage. A 3 year interval surveillance colonoscopy is recommended in June 2018. I spent 15 minutes of face-to-face time with the patient. Greater than 50% of the time was spent counseling and coordinating care.

## 2016-04-07 NOTE — Patient Instructions (Signed)
You will be due for a recall colonoscopy in 03/2017. We will send you a reminder in the mail when it gets closer to that time.  Thank you for choosing me and [facility].  Pricilla Riffle. Dagoberto Ligas., MD., Marval Regal

## 2016-04-08 ENCOUNTER — Telehealth: Payer: Self-pay | Admitting: Family Medicine

## 2016-04-08 NOTE — Telephone Encounter (Signed)
Pt is will cover balsalazide med Please call [facility] with direction and qty and strength. The reference [id number]

## 2016-04-14 ENCOUNTER — Other Ambulatory Visit: Payer: Self-pay

## 2016-04-14 MED ORDER — BALSALAZIDE DISODIUM 750 MG PO CAPS: 2250.0000 mg | ORAL_CAPSULE | Freq: Three times a day (TID) | ORAL | Status: DC

## 2016-04-14 NOTE — Telephone Encounter (Signed)
Balsalazide 1,500 mg po bid is what he has been taking so he can stay on this dose

## 2016-04-14 NOTE — Telephone Encounter (Signed)
Can you send this to Dr. Lenoard Aden CMA or nurse? He is managing this

## 2016-04-28 NOTE — Telephone Encounter (Signed)
You may want to find out who his nurse is and send to her to fill. Alternatively- see if you can order under his name.

## 2016-05-31 ENCOUNTER — Ambulatory Visit (INDEPENDENT_AMBULATORY_CARE_PROVIDER_SITE_OTHER): Payer: BC Managed Care – PPO | Admitting: Podiatry

## 2016-05-31 ENCOUNTER — Ambulatory Visit: Payer: BC Managed Care – PPO

## 2016-05-31 ENCOUNTER — Ambulatory Visit (INDEPENDENT_AMBULATORY_CARE_PROVIDER_SITE_OTHER): Payer: BC Managed Care – PPO

## 2016-05-31 ENCOUNTER — Encounter: Payer: Self-pay | Admitting: Podiatry

## 2016-05-31 VITALS — BP 134/92 | HR 92 | Resp 16

## 2016-05-31 DIAGNOSIS — M79671 Pain in right foot: Secondary | ICD-10-CM | POA: Diagnosis not present

## 2016-05-31 DIAGNOSIS — G5781 Other specified mononeuropathies of right lower limb: Secondary | ICD-10-CM

## 2016-05-31 DIAGNOSIS — M79672 Pain in left foot: Secondary | ICD-10-CM

## 2016-05-31 DIAGNOSIS — G5761 Lesion of plantar nerve, right lower limb: Secondary | ICD-10-CM | POA: Diagnosis not present

## 2016-05-31 NOTE — Progress Notes (Signed)
   Subjective:    Patient ID: [name], male    DOB: [birth year], 55 y.o.   MRN: [id number]  HPI: He presents today chief complaint of painful fourth digit of the right foot has been aching since May. He states it is worse when he is barefooted activities and sitting for long period of time he gets back up to walk. He states that he feels better with shoes and has tried different shoes with little relief. He is also concerned about thickness of the first metatarsophalangeal joint of the right foot. He states that it becomes red and swollen on occasion particular shoe gear.    Review of Systems  Allergic/Immunologic: Positive for environmental allergies and food allergies.  All other systems reviewed and are negative.      Objective:   Physical Exam: Vital signs are stable alert and oriented 3. Pulses are strongly palpable. Neurologic sensorium is intact. Deep tendon reflexes are intact bilateral muscle strength is 5 over 5 dorsiflexion plantar flexors and inverters everters all of his musculature is intact. Palpable Mulder's click is noted to the third interdigital space of the right foot none to the left foot. The toes and cells. Relatively normal. Radiographs do not demonstrate any type of osseus abnormalities other than a mild increase in the first intermetatarsal angle with hallux abductovalgus deformity of the right foot. Hypertrophic medial condyle is resulting in irritation and pain about the first metatarsophalangeal joint.        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  Plan: Discussed etiology and pathology conservative versus surgical therapies. Injected the right foot today with Kenalog and local anesthetic. Discussed the possible need for surgical intervention regarding the bunion at some point in the future. Also discussed the possible use of dehydrated alcohol.  He is a professor for religious studies at [employer]

## 2016-06-02 ENCOUNTER — Ambulatory Visit (INDEPENDENT_AMBULATORY_CARE_PROVIDER_SITE_OTHER): Payer: BC Managed Care – PPO | Admitting: Family Medicine

## 2016-06-02 ENCOUNTER — Encounter: Payer: Self-pay | Admitting: Family Medicine

## 2016-06-02 DIAGNOSIS — G43919 Migraine, unspecified, intractable, without status migrainosus: Secondary | ICD-10-CM | POA: Diagnosis not present

## 2016-06-02 MED ORDER — METOPROLOL TARTRATE 25 MG PO TABS: 25.0000 mg | ORAL_TABLET | Freq: Two times a day (BID) | ORAL | 3 refills | Status: DC

## 2016-06-02 MED ORDER — AMITRIPTYLINE HCL 10 MG PO TABS: 10.0000 mg | ORAL_TABLET | Freq: Every day | ORAL | 3 refills | Status: DC

## 2016-06-02 NOTE — Progress Notes (Signed)
Pre visit review using our clinic review tool, if applicable. No additional management support is needed unless otherwise documented below in the visit note. 

## 2016-06-02 NOTE — Progress Notes (Signed)
Subjective:  [name] is a 55 y.o. year old very pleasant male patient who presents for/with See problem oriented charting ROS- No chest pain. No headache or blurry vision. No issues with sleep lately. see any ROS included in HPI as well.   Past Medical History-  Patient Active Problem List   Diagnosis Date Noted  . ULCERATIVE COLITIS-LEFT SIDE 05/06/2008    Priority: High  . 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 HYPERLIPIDEMIA 05/15/2007    Priority: Medium  . Migraines 05/03/2007    Priority: Medium  . Low back pain 02/09/2016    Priority: Low  . 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 VITAMIN D DEFICIENCY 10/27/2009    Priority: Low  . GERD 01/31/2008    Priority: Low  . INTERNAL HEMORRHOIDS 01/23/2008    Priority: Low  . Allergic rhinitis 05/03/2007    Priority: Low  . DOE (dyspnea on exertion) 04/15/2014    Medications- reviewed and updated Current Outpatient Prescriptions  Medication Sig Dispense Refill  . albuterol (PROVENTIL HFA;VENTOLIN HFA) 108 (90 Base) MCG/ACT inhaler Inhale 2 puffs into the lungs every 6 (six) hours as needed for wheezing or shortness of breath. 1 Inhaler 0  . amitriptyline (ELAVIL) 25 MG tablet Take 1 tablet (25 mg total) by mouth at bedtime. 90 tablet 3  . balsalazide (COLAZAL) 750 MG capsule Take 3 capsules (2,250 mg total) by mouth 3 (three) times daily. 270 capsule 5  . cetirizine (ZYRTEC) 10 MG tablet Take 10 mg by mouth daily.    . cyclobenzaprine (FLEXERIL) 10 MG tablet Take 1 tablet (10 mg total) by mouth 3 (three) times daily as needed for muscle spasms. 30 tablet 0  . fluticasone (FLONASE) 50 MCG/ACT nasal spray Use one spray in each nostril twice daily 48 g 3  . ibuprofen (ADVIL,MOTRIN) 200 MG tablet Take 200 mg by mouth as needed.      Marland Kitchen KRILL OIL 1000 MG CAPS Take 1 capsule by mouth daily.    . mesalamine (CANASA) 1000 MG suppository Place 1 suppository (1,000 mg total) rectally  at bedtime. 90 suppository 0  . oxyCODONE-acetaminophen (PERCOCET/ROXICET) 5-325 MG tablet Take 1 tablet by mouth every 6 (six) hours as needed for severe pain (for migraine not controlled by imitrex). 5 tablet 0  . SUMAtriptan (IMITREX) 100 MG tablet TAKE 1 TABLET EVERY 2 HOURS AS NEEDED FOR MIGRAINE (2 max per day) 9 tablet 3  . Vitamin D, Ergocalciferol, (DRISDOL) 50000 units CAPS capsule TAKE ONE CAPSULE EVERY WEEK 52 capsule 0   No current facility-administered medications for this visit.     Objective: BP 124/88 (BP Location: Left Arm, Patient Position: Sitting, Cuff Size: Normal)   Pulse 91   Temp 98.6 F (37 C) (Oral)   Wt 180 lb 12.8 oz (82 kg)   SpO2 97%   BMI 25.94 kg/m  Gen: NAD, resting comfortably CV: RRR no murmurs rubs or gallops Lungs: CTAB no crackles, wheeze, rhonchi Abdomen: soft/nontender/nondistended/normal bowel sounds. No rebound or guarding.  Ext: no edema Skin: warm, dry Neuro: CN II-XII intact, sensation and reflexes normal throughout, 5/5 muscle strength in bilateral upper and lower extremities. Normal finger to nose. Normal rapid alternating movements. No pronator drift. Normal romberg. Normal gait.   Assessment/Plan:  Migraines S: Doxepin 14 years after migraines in 20s through Dr. Arnoldo Morale. Came off and HA pattern not changed- 2 years ago started doxepin then [employer] switched to  amitriptyline (sleeping ok- not helping with migraines- 3x a week). We titrated to 34m but no improvement in HA frequency. Has had some borderline diastolic BPs and also had a chiropractor note BP >150/100 so may get benefit from beta blocker- also admits to some anxiety A/P: will reduce amitriptyline from 222mto 1067mStart metoprolol 42m56mD- may help with anxiety and blood pressure as well (though not technically HTN). Return for CPE October. Extensive counseling.   October CPE  Meds ordered this encounter  Medications  . amitriptyline (ELAVIL) 10 MG tablet     Sig: Take 1 tablet (10 mg total) by mouth at bedtime.    Dispense:  90 tablet    Refill:  3  . metoprolol tartrate (LOPRESSOR) 25 MG tablet    Sig: Take 1 tablet (25 mg total) by mouth 2 (two) times daily.    Dispense:  180 tablet    Refill:  3   The duration of face-to-face time during this visit was 25 minutes. Greater than 50% of this time was spent in counseling, explanation of diagnosis, planning of further management, and/or coordination of care.    Return precautions advised.  StepGarret Reddish

## 2016-06-02 NOTE — Assessment & Plan Note (Signed)
S: Doxepin 14 years after migraines in 20s through Dr. Arnoldo Morale. Came off and HA pattern not changed- 2 years ago started doxepin then Roxy Cedar switched to amitriptyline (sleeping ok- not helping with migraines- 3x a week). We titrated to 88m but no improvement in HA frequency. Has had some borderline diastolic BPs and also had a chiropractor note BP >150/100 so may get benefit from beta blocker- also admits to some anxiety A/P: will reduce amitriptyline from 265mto 1060mStart metoprolol 81m15mD- may help with anxiety and blood pressure as well (though not technically HTN). Return for CPE October. Extensive counseling.

## 2016-06-02 NOTE — Patient Instructions (Signed)
Decrease amitriptyline to 40m  Start metoprolol 236mtwice a day. May need to titrate up at next visit  Hopes that we get slight improvement in blood pressure and reduction in your migraine frequency  We could consider neurology/headache wellness center appointment in future if we do not make progress with the above

## 2016-06-28 ENCOUNTER — Ambulatory Visit (INDEPENDENT_AMBULATORY_CARE_PROVIDER_SITE_OTHER): Payer: BC Managed Care – PPO | Admitting: Podiatry

## 2016-06-28 ENCOUNTER — Encounter: Payer: Self-pay | Admitting: Podiatry

## 2016-06-28 DIAGNOSIS — G5761 Lesion of plantar nerve, right lower limb: Secondary | ICD-10-CM | POA: Diagnosis not present

## 2016-06-28 DIAGNOSIS — G5781 Other specified mononeuropathies of right lower limb: Secondary | ICD-10-CM

## 2016-06-28 NOTE — Progress Notes (Signed)
He presents today for follow-up of neuroma to the third interdigital space of the right foot. He states the left foot is better but the right foot is still tender.  Objective: Vital signs are stable alert and oriented 3. Pulses are palpable. Palpable Mulder's click third interdigital space of the right foot much improved from previous evaluation.  Assessment: Neuroma third interdigital space right foot.  Plan: Discussed our options regarding neuroma surgery versus dehydrated alcohol therapy versus cortisone therapy. At this point we initiated his first dehydrated alcohol injection to the third interdigital space of the right foot I will follow-up with Prof. Jacobo Forest in 3-4 weeks for his second injection.

## 2016-07-18 ENCOUNTER — Other Ambulatory Visit: Payer: Self-pay | Admitting: Family Medicine

## 2016-07-19 ENCOUNTER — Ambulatory Visit (INDEPENDENT_AMBULATORY_CARE_PROVIDER_SITE_OTHER): Payer: BC Managed Care – PPO | Admitting: Podiatry

## 2016-07-19 ENCOUNTER — Encounter: Payer: Self-pay | Admitting: Podiatry

## 2016-07-19 DIAGNOSIS — G5761 Lesion of plantar nerve, right lower limb: Secondary | ICD-10-CM | POA: Diagnosis not present

## 2016-07-19 DIAGNOSIS — G5781 Other specified mononeuropathies of right lower limb: Secondary | ICD-10-CM

## 2016-07-19 NOTE — Progress Notes (Signed)
He presents today for follow-up of neuroma to the third digital space the right foot. He states that it was extremely painful 36 hours after the injection and then it did finally start to improve. He states that it feels much better today.  Objective: Vital signs are stable he is alert and oriented 3. Pulses are palpable. He still has pain on palpation to the third interdigital space of the right foot that appears to be much improved from previous visits.  Assessment: Neuroma third interdigital space right foot improving.  Plan: Reinjected the right foot today with 1-1/2 mL of dehydrated alcohol 4%. And I will follow-up with him in 3 weeks.

## 2016-07-26 ENCOUNTER — Other Ambulatory Visit (INDEPENDENT_AMBULATORY_CARE_PROVIDER_SITE_OTHER): Payer: BC Managed Care – PPO

## 2016-07-26 DIAGNOSIS — Z0000 Encounter for general adult medical examination without abnormal findings: Secondary | ICD-10-CM

## 2016-07-26 LAB — HEPATIC FUNCTION PANEL
ALK PHOS: 30 U/L — AB (ref 39–117)
ALT: 9 U/L (ref 0–53)
AST: 10 U/L (ref 0–37)
Albumin: 4.8 g/dL (ref 3.5–5.2)
BILIRUBIN DIRECT: 0.1 mg/dL (ref 0.0–0.3)
TOTAL PROTEIN: 7 g/dL (ref 6.0–8.3)
Total Bilirubin: 0.9 mg/dL (ref 0.2–1.2)

## 2016-07-26 LAB — POC URINALSYSI DIPSTICK (AUTOMATED)
Blood, UA: NEGATIVE
Glucose, UA: NEGATIVE
KETONES UA: NEGATIVE
Leukocytes, UA: NEGATIVE
Nitrite, UA: NEGATIVE
PH UA: 5.5
SPEC GRAV UA: 1.025
Urobilinogen, UA: 0.2

## 2016-07-26 LAB — BASIC METABOLIC PANEL
BUN: 12 mg/dL (ref 6–23)
CHLORIDE: 104 meq/L (ref 96–112)
CO2: 32 mEq/L (ref 19–32)
CREATININE: 1.29 mg/dL (ref 0.40–1.50)
Calcium: 9.6 mg/dL (ref 8.4–10.5)
GFR: 61.75 mL/min (ref >60.00)
Glucose, Bld: 100 mg/dL — ABNORMAL HIGH (ref 70–99)
Potassium: 4.5 mEq/L (ref 3.5–5.1)
SODIUM: 142 meq/L (ref 135–145)

## 2016-07-26 LAB — LIPID PANEL
CHOL/HDL RATIO: 4
Cholesterol: 242 mg/dL — ABNORMAL HIGH (ref 0–200)
HDL: 60.3 mg/dL (ref >39.00)
LDL Cholesterol: 160 mg/dL — ABNORMAL HIGH (ref 0–99)
NONHDL: 181.99
Triglycerides: 110 mg/dL (ref 0.0–149.0)
VLDL: 22 mg/dL (ref 0.0–40.0)

## 2016-07-26 LAB — CBC WITH DIFFERENTIAL/PLATELET
BASOS ABS: 0 10*3/uL (ref 0.0–0.1)
BASOS PCT: 0.5 % (ref 0.0–3.0)
EOS ABS: 0.1 10*3/uL (ref 0.0–0.7)
Eosinophils Relative: 2.1 % (ref 0.0–5.0)
HEMATOCRIT: 43 % (ref 39.0–52.0)
HEMOGLOBIN: 14.8 g/dL (ref 13.0–17.0)
LYMPHS PCT: 31.2 % (ref 12.0–46.0)
Lymphs Abs: 1.5 10*3/uL (ref 0.7–4.0)
MCHC: 34.4 g/dL (ref 30.0–36.0)
MCV: 85.8 fl (ref 78.0–100.0)
Monocytes Absolute: 0.5 10*3/uL (ref 0.1–1.0)
Monocytes Relative: 10 % (ref 3.0–12.0)
Neutro Abs: 2.8 10*3/uL (ref 1.4–7.7)
Neutrophils Relative %: 56.2 % (ref 43.0–77.0)
Platelets: 205 10*3/uL (ref 150.0–400.0)
RBC: 5.01 Mil/uL (ref 4.22–5.81)
RDW: 13 % (ref 11.5–15.5)
WBC: 5 10*3/uL (ref 4.0–10.5)

## 2016-07-26 LAB — TSH: TSH: 1.5 u[IU]/mL (ref 0.35–4.50)

## 2016-07-26 LAB — PSA: PSA: 0.31 ng/mL (ref 0.10–4.00)

## 2016-08-01 ENCOUNTER — Other Ambulatory Visit: Payer: BC Managed Care – PPO

## 2016-08-04 ENCOUNTER — Ambulatory Visit (INDEPENDENT_AMBULATORY_CARE_PROVIDER_SITE_OTHER): Payer: BC Managed Care – PPO | Admitting: Family Medicine

## 2016-08-04 ENCOUNTER — Encounter: Payer: Self-pay | Admitting: Family Medicine

## 2016-08-04 VITALS — BP 130/88 | HR 66 | Temp 98.4°F | Ht 70.5 in | Wt 182.6 lb

## 2016-08-04 DIAGNOSIS — H9190 Unspecified hearing loss, unspecified ear: Secondary | ICD-10-CM | POA: Insufficient documentation

## 2016-08-04 DIAGNOSIS — G43919 Migraine, unspecified, intractable, without status migrainosus: Secondary | ICD-10-CM

## 2016-08-04 DIAGNOSIS — Z0000 Encounter for general adult medical examination without abnormal findings: Secondary | ICD-10-CM | POA: Diagnosis not present

## 2016-08-04 MED ORDER — CIPROFLOXACIN HCL 500 MG PO TABS: 500.0000 mg | ORAL_TABLET | Freq: Two times a day (BID) | ORAL | 0 refills | Status: DC

## 2016-08-04 MED ORDER — PROMETHAZINE HCL 12.5 MG PO TABS: 12.5000 mg | ORAL_TABLET | Freq: Four times a day (QID) | ORAL | 0 refills | Status: DC | PRN

## 2016-08-04 MED ORDER — OXYCODONE-ACETAMINOPHEN 5-325 MG PO TABS: 1.0000 | ORAL_TABLET | Freq: Four times a day (QID) | ORAL | 0 refills | Status: DC | PRN

## 2016-08-04 NOTE — Assessment & Plan Note (Signed)
Migraines- reduced amitriptyline to 696m last visit as was not helping with sleep further and not reducing migraines. Start metoprolol 24mBID for prophylaxis.  25% reduction in triptan, advil 50% reduction. Sleep issues 1-2 nights a week. Oxycodone 96m21m5 lasted 6 months- refill today. HRnear 60- do not see space to further titrate up prophylactic medications

## 2016-08-04 NOTE — Assessment & Plan Note (Signed)
6% 10 year risk in 2017. Lipids much better controlled and LDL even under 100 years ago when weight down about 20 lbs. We set target for about 10 lbs down and recheck next year.

## 2016-08-04 NOTE — Patient Instructions (Signed)
Target 10 lbs down just because lipids looked better when weight was lower- recheck 1 year  Migraines- no changes. Reassess perhaps 6 months or sooner if needed. Hopeful the percocet lasts that long  Go to every other day vitamin D

## 2016-08-04 NOTE — Assessment & Plan Note (Signed)
On vit D 50k units since 2014. Advised go to every other week and recheck next visit.

## 2016-08-04 NOTE — Progress Notes (Signed)
Phone: [phone]  Subjective:  Patient presents today for their annual physical. Chief complaint-noted.   See problem oriented charting- ROS- full  review of systems was completed and negative except for: continued issues with insomnia and intermittent headaches/migraines  The following were reviewed and entered/updated in epic: Past Medical History:  Diagnosis Date  . Allergic rhinitis   . Allergy   . Arthritis   . GERD (gastroesophageal reflux disease)   . Heart murmur    tricuspid valve regurgitation  . Internal hemorrhoids   . Migraines   . 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 ULCERATIVE COLITIS-LEFT SIDE 05/06/2008    Priority: High  . 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 Hyperlipidemia 05/15/2007    Priority: Medium  . Migraines 05/03/2007    Priority: Medium  . Low back pain 02/09/2016    Priority: Low  . 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 Vitamin D deficiency 10/27/2009    Priority: Low  . GERD 01/31/2008    Priority: Low  . INTERNAL HEMORRHOIDS 01/23/2008    Priority: Low  . Allergic rhinitis 05/03/2007    Priority: Low  . Hearing loss 08/04/2016  . DOE (dyspnea on exertion) 04/15/2014   Past Surgical History:  Procedure Laterality Date  . COLONOSCOPY    . Eye Muscle transplant     Bilateral  . UPPER GASTROINTESTINAL ENDOSCOPY      Family History  Problem Relation Age of Onset  . Thyroid disease Father   . Heart disease Father     a fib, expanded aorta  . Arthritis Mother   . Colon cancer      Grandmother's Family (x 1 brother to grandmother and 1 sister to grandmother)  . Crohn's disease Paternal Aunt   . Crohn's disease Cousin   . Colon polyps Maternal Grandmother     Great Aunt, Great Uncle  . Heart disease Brother   . Esophageal cancer Neg Hx   . Stomach cancer Neg Hx   . Rectal cancer Neg Hx     Medications-  reviewed and updated Current Outpatient Prescriptions  Medication Sig Dispense Refill  . amitriptyline (ELAVIL) 10 MG tablet Take 1 tablet (10 mg total) by mouth at bedtime. 90 tablet 3  . balsalazide (COLAZAL) 750 MG capsule Take 3 capsules (2,250 mg total) by mouth 3 (three) times daily. 270 capsule 5  . cetirizine (ZYRTEC) 10 MG tablet Take 10 mg by mouth daily.    . cyclobenzaprine (FLEXERIL) 10 MG tablet Take 1 tablet (10 mg total) by mouth 3 (three) times daily as needed for muscle spasms. 30 tablet 0  . fluticasone (FLONASE) 50 MCG/ACT nasal spray Use one spray in each nostril twice daily 48 g 3  . ibuprofen (ADVIL,MOTRIN) 200 MG tablet Take 200 mg by mouth as needed.      Marland Kitchen KRILL OIL 1000 MG CAPS Take 1 capsule by mouth daily.    . mesalamine (CANASA) 1000 MG suppository Place 1 suppository (1,000 mg total) rectally at bedtime. 90 suppository 0  . metoprolol tartrate (LOPRESSOR) 25 MG tablet Take 1 tablet (25 mg total) by mouth 2 (two) times daily. 180 tablet 3  . oxyCODONE-acetaminophen (PERCOCET/ROXICET) 5-325 MG tablet Take 1 tablet by mouth every 6 (six) hours as needed for severe pain (for migraine not controlled by imitrex). 5 tablet 0  . SUMAtriptan (IMITREX) 100 MG tablet TAKE  1 TABLET EVERY 2 HOURSAS NEEDED FOR MIGRAINE (2  TABLETS MAXIMUMPER DAY) 9 tablet 3  . Vitamin D, Ergocalciferol, (DRISDOL) 50000 units CAPS capsule TAKE ONE CAPSULE EVERY WEEK 52 capsule 0  . ciprofloxacin (CIPRO) 500 MG tablet Take 1 tablet (500 mg total) by mouth 2 (two) times daily. For nonbloody travelers diarrhea 6 tablet 0  . promethazine (PHENERGAN) 12.5 MG tablet Take 1 tablet (12.5 mg total) by mouth every 6 (six) hours as needed for nausea or vomiting. 30 tablet 0   No current facility-administered medications for this visit.     Allergies-reviewed and updated Allergies  Allergen Reactions  . Citrus     Oral pain, rash in mouth  . Codeine Nausea Only  . Shellfish Allergy Nausea And Vomiting     scallops  . Salmon [Fish Allergy] Nausea Only and Rash    Especially smoked salmon    Social History   Social History  . Marital status: Single    Spouse name: Domestic partner  . Number of children: N/A  . Years of education: N/A   Occupational History  . 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 Smoking status: Never Smoker  . Smokeless tobacco: Never Used  . Alcohol use 0.0 oz/week     Comment: 1-2 a day  . Drug use: No  . Sexual activity: Not Asked   Other Topics Concern  . None   Social History Narrative   Lives with husband Dwaine Deter also a patient of Dr. Yong Channel). No pets or adopted children or natural children.       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       Hobbies: walking, hiking, reading, music, cooking , gardening.     Objective: BP 130/88 (BP Location: Left Arm, Patient Position: Sitting, Cuff Size: Large)   Pulse 66   Temp 98.4 F (36.9 C) (Oral)   Ht 5' 10.5" (1.791 m)   Wt 182 lb 9.6 oz (82.8 kg)   SpO2 97%   BMI 25.83 kg/m  Gen: NAD, resting comfortably HEENT: Mucous membranes are moist. Oropharynx normal Neck: no thyromegaly CV: RRR no murmurs rubs or gallops Lungs: CTAB no crackles, wheeze, rhonchi Abdomen: soft/nontender/nondistended/normal bowel sounds. No rebound or guarding.  Ext: no edema, 2+ DP pulses Skin: warm, dry Neuro: grossly normal, moves all extremities, PERRLA Rectal: normal tone, normal sized  prostate, no masses or tenderness   Assessment/Plan:  55 y.o. male presenting for annual physical.  Health Maintenance counseling: 1. Anticipatory guidance: Patient counseled regarding regular dental exams, eye exams, wearing seatbelts.  2. Risk factor reduction:  Advised patient of need for regular exercise (8-10k steps a day, walking 20-25 minutes twice a day) and diet rich and fruits and vegetables to reduce risk of heart attack and stroke. Diet pretty reasonable- may cut some sugar  and alcohol.  3. Immunizations/screenings/ancillary studies Immunization History  Administered Date(s) Administered  . Influenza Whole 07/31/2009, 06/16/2010  . Influenza,inj,Quad PF,36+ Mos 06/30/2015  . Influenza-Unspecified 07/21/2016  . Td 05/09/2006  . Tdap 11/01/2013  4. Prostate cancer screening- low risk PSA and rectal trend  Lab Results  Component Value Date   PSA 0.31 07/26/2016   PSA 0.28 10/28/2013   PSA 0.28 10/29/2012   5. Colon cancer screening - 04/01/14- but has more regular follow up due to ulcerative colitis 6. Skin cancer screening- saw one a year ago, considering follow up  Status of chronic or acute concerns  Ulcerative  colitis-  on balsalazide through GI as well as canasa suppositories. Doing well  Going to [place] - wants to have course of cipro for traveler's diarrhea.  promethazine refill with UC and potential travelers illness. . Husband to send me message for ciprofloxacin as well  Dealing with neuroma pain- following with podiatry  Migraines Migraines- reduced amitriptyline to 60m last visit as was not helping with sleep further and not reducing migraines. Start metoprolol 21mBID for prophylaxis.  25% reduction in triptan, advil 50% reduction. Sleep issues 1-2 nights a week. Oxycodone 68m59m5 lasted 6 months- refill today. HRnear 60- do not see space to further titrate up prophylactic medications  Hyperlipidemia 6% 10 year risk in 2017. Lipids much better controlled and LDL even under 100 years ago when weight down about 20 lbs. We set target for about 10 lbs down and recheck next year.   Vitamin D deficiency On vit D 50k units since 2014. Advised go to every other week and recheck next visit.  6 month follow up  Meds ordered this encounter  Medications  . oxyCODONE-acetaminophen (PERCOCET/ROXICET) 5-325 MG tablet    Sig: Take 1 tablet by mouth every 6 (six) hours as needed for severe pain (for migraine not controlled by imitrex).    Dispense:  5  tablet    Refill:  0  . ciprofloxacin (CIPRO) 500 MG tablet    Sig: Take 1 tablet (500 mg total) by mouth 2 (two) times daily. For nonbloody travelers diarrhea    Dispense:  6 tablet    Refill:  0  . promethazine (PHENERGAN) 12.5 MG tablet    Sig: Take 1 tablet (12.5 mg total) by mouth every 6 (six) hours as needed for nausea or vomiting.    Dispense:  30 tablet    Refill:  0    Return precautions advised.   SteGarret ReddishD

## 2016-08-04 NOTE — Progress Notes (Signed)
Pre visit review using our clinic review tool, if applicable. No additional management support is needed unless otherwise documented below in the visit note. 

## 2016-08-08 ENCOUNTER — Encounter: Payer: BC Managed Care – PPO | Admitting: Family Medicine

## 2016-08-09 ENCOUNTER — Ambulatory Visit (INDEPENDENT_AMBULATORY_CARE_PROVIDER_SITE_OTHER): Payer: BC Managed Care – PPO | Admitting: Podiatry

## 2016-08-09 ENCOUNTER — Encounter: Payer: Self-pay | Admitting: Podiatry

## 2016-08-09 DIAGNOSIS — G5761 Lesion of plantar nerve, right lower limb: Secondary | ICD-10-CM

## 2016-08-09 DIAGNOSIS — G5781 Other specified mononeuropathies of right lower limb: Secondary | ICD-10-CM

## 2016-08-09 NOTE — Progress Notes (Signed)
He presents today for follow-up of his neuroma and his alcohol injections. He states that he is approximately 50% improved. Today will be his third injection.  Objective: Vital signs are stable he is alert and oriented 3. Pulses are palpable. Palpable Mulder's click third interdigital space of the right foot but much less painful than previously noted.  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  Plan: Injected his third dose of dehydrated alcohol today. Follow up with him in 2 weeks before he goes to [place].

## 2016-08-23 ENCOUNTER — Ambulatory Visit (INDEPENDENT_AMBULATORY_CARE_PROVIDER_SITE_OTHER): Payer: BC Managed Care – PPO | Admitting: Podiatry

## 2016-08-23 ENCOUNTER — Encounter: Payer: Self-pay | Admitting: Podiatry

## 2016-08-23 DIAGNOSIS — M79671 Pain in right foot: Secondary | ICD-10-CM | POA: Diagnosis not present

## 2016-08-23 DIAGNOSIS — G5761 Lesion of plantar nerve, right lower limb: Secondary | ICD-10-CM | POA: Diagnosis not present

## 2016-08-23 DIAGNOSIS — G5781 Other specified mononeuropathies of right lower limb: Secondary | ICD-10-CM

## 2016-08-23 NOTE — Progress Notes (Signed)
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 He states that it has not progressed as much as it did from the previous injection however it has continued to improve.  Objective: Vital signs are stable he is alert and oriented 3 pulses are palpable. No calf pain. Palpable Mulder's click has lessened to the third interdigital space has lessened right foot.  Assessment: Resolving neuroma third interdigital space right foot.  Plan: Injected dehydrated alcohol third interdigital space right foot.  Follow up with him once he returns from [place].

## 2016-10-10 HISTORY — PX: COLONOSCOPY: SHX174

## 2016-10-18 ENCOUNTER — Encounter: Payer: Self-pay | Admitting: Family Medicine

## 2016-10-18 ENCOUNTER — Ambulatory Visit (INDEPENDENT_AMBULATORY_CARE_PROVIDER_SITE_OTHER): Payer: BC Managed Care – PPO | Admitting: Family Medicine

## 2016-10-18 VITALS — BP 120/80 | HR 69 | Temp 98.3°F | Ht 70.5 in | Wt 183.5 lb

## 2016-10-18 DIAGNOSIS — Z889 Allergy status to unspecified drugs, medicaments and biological substances status: Secondary | ICD-10-CM | POA: Diagnosis not present

## 2016-10-18 DIAGNOSIS — R21 Rash and other nonspecific skin eruption: Secondary | ICD-10-CM | POA: Diagnosis not present

## 2016-10-18 MED ORDER — EPINEPHRINE 0.3 MG/0.3ML IJ SOAJ: 0.3000 mg | Freq: Once | INTRAMUSCULAR | 0 refills | Status: AC

## 2016-10-18 NOTE — Patient Instructions (Signed)
Please discuss proper use and interactions of the epipen with the pharmacist.  Please call for recheck with your allergist and in the interim avoid seafood.  Please take zyrtec daily and use topical steroid for itch on the rash.  Seek emergency care immediately if any concerns for a serious or life threatening allergic reaction.  Follow up with your doctor if rash does not resolve as expected, worsening or new concerns arise.

## 2016-10-18 NOTE — Progress Notes (Signed)
Pre visit review using our clinic review tool, if applicable. No additional management support is needed unless otherwise documented below in the visit note. 

## 2016-10-18 NOTE — Progress Notes (Addendum)
HPI:  [name] is a pleasant with a reported history of significant allergies, on allergy shots in the past and with seafood allergies here for an acute visit for a rash that he feels is hives. The rash developed suddenly 1 week ago approx 12 hours after eating lots of prawns in [place]. He had also used a new soap that day. He develops hives on the legs, arms and drunk and on his face and had tingling on the lips. He did not have nausea, vomiting, shortness of breath, throat/face/tongue swelling or any other symptoms. Has improved since then and is fading. He wants refill on epipen as his is expired and he thinks he may need to see the allergist again as he does not think testing for prawns was done. He has no symptoms today other then fading rash with mild pruritis on several of the skin lesions.  ROS: See pertinent positives and negatives per HPI.  Past Medical History:  Diagnosis Date  . Allergic rhinitis   . Allergy   . Arthritis   . GERD (gastroesophageal reflux disease)   . Heart murmur    tricuspid valve regurgitation  . Internal hemorrhoids   . Migraines   . 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 COLONOSCOPY    . Eye Muscle transplant     Bilateral  . UPPER GASTROINTESTINAL ENDOSCOPY      Family History  Problem Relation Age of Onset  . Thyroid disease Father   . Heart disease Father     a fib, expanded aorta  . Arthritis Mother   . Colon cancer      Grandmother's Family (x 1 brother to grandmother and 1 sister to grandmother)  . Crohn's disease Paternal Aunt   . Crohn's disease Cousin   . Colon polyps Maternal Grandmother     Great Aunt, Great Uncle  . Heart disease Brother   . Esophageal cancer Neg Hx   . Stomach cancer Neg Hx   . Rectal cancer Neg Hx     Social History   Social History  . Marital status: Single    Spouse name: Domestic partner  . Number of children: N/A  . Years of education:  N/A   Occupational History  . 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 Smoking status: Never Smoker  . Smokeless tobacco: Never Used  . Alcohol use 0.0 oz/week     Comment: 1-2 a day  . Drug use: No  . Sexual activity: Not Asked   Other Topics Concern  . None   Social History Narrative   Lives with husband Dwaine Deter also a patient of Dr. Yong Channel). No pets or adopted children or natural children.       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       Hobbies: walking, hiking, reading, music, cooking , gardening.      Current Outpatient Prescriptions:  .  amitriptyline (ELAVIL) 10 MG tablet, Take 1 tablet (10 mg total) by mouth at bedtime., Disp: 90 tablet, Rfl: 3 .  balsalazide (COLAZAL) 750 MG capsule, Take 3 capsules (2,250 mg total) by mouth 3 (three) times daily., Disp: 270 capsule, Rfl: 5 .  cetirizine (ZYRTEC) 10 MG tablet, Take 10 mg by mouth daily., Disp: , Rfl:  .  ciprofloxacin (CIPRO) 500 MG tablet, Take 1 tablet (500 mg total) by mouth 2 (two) times daily. For nonbloody travelers diarrhea, Disp:  6 tablet, Rfl: 0 .  cyclobenzaprine (FLEXERIL) 10 MG tablet, Take 1 tablet (10 mg total) by mouth 3 (three) times daily as needed for muscle spasms., Disp: 30 tablet, Rfl: 0 .  fluticasone (FLONASE) 50 MCG/ACT nasal spray, Use one spray in each nostril twice daily, Disp: 48 g, Rfl: 3 .  ibuprofen (ADVIL,MOTRIN) 200 MG tablet, Take 200 mg by mouth as needed.  , Disp: , Rfl:  .  KRILL OIL 1000 MG CAPS, Take 1 capsule by mouth daily., Disp: , Rfl:  .  mesalamine (CANASA) 1000 MG suppository, Place 1 suppository (1,000 mg total) rectally at bedtime., Disp: 90 suppository, Rfl: 0 .  metoprolol tartrate (LOPRESSOR) 25 MG tablet, Take 1 tablet (25 mg total) by mouth 2 (two) times daily., Disp: 180 tablet, Rfl: 3 .  oxyCODONE-acetaminophen (PERCOCET/ROXICET) 5-325 MG tablet, Take 1 tablet by mouth every 6 (six) hours as needed for severe pain (for  migraine not controlled by imitrex)., Disp: 5 tablet, Rfl: 0 .  promethazine (PHENERGAN) 12.5 MG tablet, Take 1 tablet (12.5 mg total) by mouth every 6 (six) hours as needed for nausea or vomiting., Disp: 30 tablet, Rfl: 0 .  SUMAtriptan (IMITREX) 100 MG tablet, TAKE 1 TABLET EVERY 2 HOURSAS NEEDED FOR MIGRAINE (2  TABLETS MAXIMUMPER DAY), Disp: 9 tablet, Rfl: 3 .  Vitamin D, Ergocalciferol, (DRISDOL) 50000 units CAPS capsule, TAKE ONE CAPSULE EVERY WEEK, Disp: 52 capsule, Rfl: 0 .  EPINEPHrine 0.3 mg/0.3 mL IJ SOAJ injection, Inject 0.3 mLs (0.3 mg total) into the muscle once. For severe allergic reaction., Disp: 1 Device, Rfl: 0  EXAM:  Vitals:   10/18/16 0941  BP: 120/80  Pulse: 69  Temp: 98.3 F (36.8 C)    Body mass index is 25.96 kg/m.  GENERAL: vitals reviewed and listed above, alert, oriented, appears well hydrated and in no acute distress  HEENT: atraumatic, conjunttiva clear, no obvious abnormalities on inspection of external nose and ears  NECK: no obvious masses on inspection  LUNGS: clear to auscultation bilaterally, no wheezes, rales or rhonchi, good air movement  CV: HRRR, no peripheral edema  SKIN: scattered erythematous irr shaped raised patches on the arms, leg, trunk  MS: moves all extremities without noticeable abnormality  PSYCH: pleasant and cooperative, no obvious depression or anxiety  ASSESSMENT AND PLAN:  Discussed the following assessment and plan:  Rash and nonspecific skin eruption  Hx of allergic reaction  -hx and exam findings do suggest resolving urticaria without any other symptoms  -epipen refilled after discussed risks/proper use/interations, opted for antihistamine and top steroid for rash, avoid seafood for now, revisit allergist and return and emergency precautions, f/u with PCP if rash persists -Patient advised to return or notify a doctor immediately if symptoms worsen or persist or new concerns arise.  Patient Instructions   Please discuss proper use and interactions of the epipen with the pharmacist.  Please call for recheck with your allergist and in the interim avoid seafood.  Please take zyrtec daily and use topical steroid for itch on the rash.  Seek emergency care immediately if any concerns for a serious or life threatening allergic reaction.  Follow up with your doctor if rash does not resolve as expected, worsening or new concerns arise.   Colin Benton R., DO

## 2016-11-03 ENCOUNTER — Encounter: Payer: Self-pay | Admitting: Podiatry

## 2016-11-03 ENCOUNTER — Ambulatory Visit (INDEPENDENT_AMBULATORY_CARE_PROVIDER_SITE_OTHER): Payer: BC Managed Care – PPO | Admitting: Podiatry

## 2016-11-03 DIAGNOSIS — G5761 Lesion of plantar nerve, right lower limb: Secondary | ICD-10-CM | POA: Diagnosis not present

## 2016-11-03 DIAGNOSIS — B353 Tinea pedis: Secondary | ICD-10-CM

## 2016-11-03 DIAGNOSIS — G5781 Other specified mononeuropathies of right lower limb: Secondary | ICD-10-CM

## 2016-11-03 MED ORDER — NAFTIFINE HCL 2 % EX GEL: 1.0000 "application " | Freq: Two times a day (BID) | CUTANEOUS | 2 refills | Status: DC

## 2016-11-03 NOTE — Progress Notes (Signed)
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 He states that it was only okay for a while and I notice it started to come back while he was on his trip. He states that it was not nearly as tender as it has been in the past and he is also concerned about athlete's foot.  Objective: Vital signs are stable he is alert and oriented 3. Pulses are palpable. Neurologic sensorium is intact. Deep tendon reflexes are intact muscle strength is normal. Orthopedic evaluation demonstrates almost a single full range of motion without crepitus. Palpable Mulder's click third interdigital space of the right foot. Mild interdigital tinea pedis is also noted.   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  Plan: Discussed etiology pathology conservative or surgical therapies started his dehydrated alcohol once again. And I also wrote a prescription for Naftin gel 2% to be applied twice daily.

## 2016-11-24 ENCOUNTER — Encounter: Payer: Self-pay | Admitting: Podiatry

## 2016-11-24 ENCOUNTER — Ambulatory Visit (INDEPENDENT_AMBULATORY_CARE_PROVIDER_SITE_OTHER): Payer: BC Managed Care – PPO | Admitting: Podiatry

## 2016-11-24 DIAGNOSIS — G5761 Lesion of plantar nerve, right lower limb: Secondary | ICD-10-CM

## 2016-11-24 DIAGNOSIS — G5781 Other specified mononeuropathies of right lower limb: Secondary | ICD-10-CM

## 2016-11-24 NOTE — Progress Notes (Signed)
He presents today for follow-up of neuroma third interdigital space of the right foot. He states that I was canceled because it really doesn't hurt very much anymore.  Objective: Vital signs are stable he is alert and oriented 3. Pulses are palpable. He has mild tenderness on palpation third interdigital space of the right foot.  Assessment: Neuroma third interdigital space right.  Plan: injected 30 interdigital space today he had an alcohol follow-up with him if needed in 3 weeks.

## 2016-11-27 ENCOUNTER — Other Ambulatory Visit: Payer: Self-pay | Admitting: Family Medicine

## 2016-11-29 ENCOUNTER — Other Ambulatory Visit: Payer: Self-pay | Admitting: Family Medicine

## 2016-12-20 ENCOUNTER — Ambulatory Visit: Payer: BC Managed Care – PPO | Admitting: Podiatry

## 2017-01-24 ENCOUNTER — Encounter: Payer: Self-pay | Admitting: Gastroenterology

## 2017-02-12 ENCOUNTER — Other Ambulatory Visit: Payer: Self-pay | Admitting: Family Medicine

## 2017-02-14 ENCOUNTER — Ambulatory Visit (INDEPENDENT_AMBULATORY_CARE_PROVIDER_SITE_OTHER): Payer: BC Managed Care – PPO | Admitting: Family Medicine

## 2017-02-14 ENCOUNTER — Encounter: Payer: Self-pay | Admitting: Family Medicine

## 2017-02-14 VITALS — BP 122/78 | HR 68 | Temp 98.6°F | Wt 186.6 lb

## 2017-02-14 DIAGNOSIS — G43119 Migraine with aura, intractable, without status migrainosus: Secondary | ICD-10-CM

## 2017-02-14 DIAGNOSIS — E7800 Pure hypercholesterolemia, unspecified: Secondary | ICD-10-CM

## 2017-02-14 DIAGNOSIS — S93402A Sprain of unspecified ligament of left ankle, initial encounter: Secondary | ICD-10-CM | POA: Diagnosis not present

## 2017-02-14 DIAGNOSIS — R55 Syncope and collapse: Secondary | ICD-10-CM

## 2017-02-14 NOTE — Progress Notes (Signed)
Subjective:  [name] is a 56 y.o. year old very pleasant male patient who presents for/with See problem oriented charting ROS- poor sleep a few nights a week, no chest pain. Did have one syncopal event in last few months as noted below. No chest pain or shortness of breath. No regular palpitations.    Past Medical History-  Patient Active Problem List   Diagnosis Date Noted  . ULCERATIVE COLITIS-LEFT SIDE 05/06/2008    Priority: High  . 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 Hyperlipidemia 05/15/2007    Priority: Medium  . Migraines 05/03/2007    Priority: Medium  . Low back pain 02/09/2016    Priority: Low  . 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 Vitamin D deficiency 10/27/2009    Priority: Low  . GERD 01/31/2008    Priority: Low  . INTERNAL HEMORRHOIDS 01/23/2008    Priority: Low  . Allergic rhinitis 05/03/2007    Priority: Low  . Vasovagal syncope 02/14/2017  . Hearing loss 08/04/2016  . DOE (dyspnea on exertion) 04/15/2014    Medications- reviewed and updated Current Outpatient Prescriptions  Medication Sig Dispense Refill  . amitriptyline (ELAVIL) 10 MG tablet Take 1 tablet (10 mg total) by mouth at bedtime. 90 tablet 3  . balsalazide (COLAZAL) 750 MG capsule TAKE 3 CAPSULES (=2250MG) 3TIMES A DAY. 270 capsule 4  . cetirizine (ZYRTEC) 10 MG tablet Take 10 mg by mouth daily.    . fluticasone (FLONASE) 50 MCG/ACT nasal spray USE 1 SPRAY IN EACH NOSTRILTWICE DAILY 48 g 3  . ibuprofen (ADVIL,MOTRIN) 200 MG tablet Take 200 mg by mouth as needed.      Marland Kitchen KRILL OIL 1000 MG CAPS Take 1 capsule by mouth daily.    . mesalamine (CANASA) 1000 MG suppository Place 1 suppository (1,000 mg total) rectally at bedtime. 90 suppository 0  . Naftifine HCl 2 % GEL Apply 1 application topically 2 (two) times daily. 60 g 2  . oxyCODONE-acetaminophen (PERCOCET/ROXICET) 5-325 MG tablet Take 1 tablet by mouth every 6 (six) hours as needed  for severe pain (for migraine not controlled by imitrex). 5 tablet 0  . promethazine (PHENERGAN) 12.5 MG tablet Take 1 tablet (12.5 mg total) by mouth every 6 (six) hours as needed for nausea or vomiting. 30 tablet 0  . SUMAtriptan (IMITREX) 100 MG tablet TAKE 1 TABLET EVERY 2 HOURSAS NEEDED FOR MIGRAINE (2  TABLETS MAXIMUMPER DAY) 9 tablet 3  . Vitamin D, Ergocalciferol, (DRISDOL) 50000 units CAPS capsule TAKE ONE CAPSULE EVERY WEEK 52 capsule 0  . cyclobenzaprine (FLEXERIL) 10 MG tablet Take 1 tablet (10 mg total) by mouth 3 (three) times daily as needed for muscle spasms. (Patient not taking: Reported on 02/14/2017) 30 tablet 0  . metoprolol tartrate (LOPRESSOR) 25 MG tablet Take 1 tablet (25 mg total) by mouth 2 (two) times daily. (Patient not taking: Reported on 02/14/2017) 180 tablet 3   No current facility-administered medications for this visit.     Objective: BP 122/78 (BP Location: Left Arm, Patient Position: Sitting, Cuff Size: Normal)   Pulse 68   Temp 98.6 F (37 C) (Oral)   Wt 186 lb 9.6 oz (84.6 kg)   SpO2 98%   BMI 26.40 kg/m  Gen: NAD, resting comfortably CV: RRR no murmurs rubs or gallops Lungs: CTAB no crackles, wheeze, rhonchi Ext: no edema Good ankle range of motion. Mild Pain over lateral ankle- anterior and below  lateral malleolus Skin: warm, dry Neuro: grossly normal, moves all extremities, normal gait  Assessment/Plan:  Migraines S: Patient states on regimen of amitriptyline 8m he sleeps better but not sure if helping with his headaches (prior 239mand did not show further reduction in headaches). Also started on metoprolol and last visit noted at least a 25% improvement. If anything- has at least lowered his blood pressure to more ideal rangeToday he states he is not as sure about that improvement. Still with severe migraines 1-2x a month and prodrome with mild pain at least 2-3x a week. Imitrex still helps with resolution. Has vary sparing oxycodone for more  severe headaches.  A/P: Patient seems to ask about stopping both metoprolol and amitriptyline as he is not sure of benefit but I think this may be due to him being further out from the reported improvement at last visit. I encouraged him to continue current regimen. Potentially could trial off amitriptyline at future visit for 2-3 week period to see how he does with migraines/potentailly headaches as well.   Hyperlipidemia S: poorly controlled on no rx. No myalgias. Goal was more weight loss but unfortunately up a few lbs today.  Lab Results  Component Value Date   CHOL 242 (H) 07/26/2016   HDL 60.30 07/26/2016   LDLCALC 160 (H) 07/26/2016   LDLDIRECT 135.1 10/28/2013   TRIG 110.0 07/26/2016   CHOLHDL 4 07/26/2016   A/P: we discussed continued lifestyle changes. 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 Quickly recovered with care from plane staff. Happened over a month ago with no recurrence and no chest pain, shortness of breath.  A/P: suspect vasovagal syncope from being overheated and cramped in combination with orthostatic hypotension when he stood quickly. He agrees to follow up for recurrent symptoms but will monitor only for now  Left Ankle instability .S: feeling of left ankle instability for 5-6 months. Denies pain. Very mild pain when it feels like it will give way. Only left foot. No fall or injury.  A/P: suspected left ankle strain though unclear when this occurred. I gave him some rehab exercises to work through and walked him through these  Also has some baseline anxiety which he thinks may be worsening- agrees to get plugged back into behavioral health Return in about  6 months (around 08/17/2017) for physical.  The duration of face-to-face time during this visit was greater than 30 minutes. Greater than 50% of this time was spent in counseling, explanation of diagnosis, planning of further management, and/or coordination of care including discussing benefits and risks of 2 migraine meds, discussing possible mechanism of ankle injury, discussing possible loss of proprioception in ankle from prior sprain leading to orientation issues in space and then instability when stepping on area, anxiety concerns.    Return precautions advised.  StGarret ReddishMD

## 2017-02-14 NOTE — Assessment & Plan Note (Signed)
S: Patient states on regimen of amitriptyline 53m he sleeps better but not sure if helping with his headaches (prior 280mand did not show further reduction in headaches). Also started on metoprolol and last visit noted at least a 25% improvement. If anything- has at least lowered his blood pressure to more ideal rangeToday he states he is not as sure about that improvement. Still with severe migraines 1-2x a month and prodrome with mild pain at least 2-3x a week. Imitrex still helps with resolution. Has vary sparing oxycodone for more severe headaches.  A/P: Patient seems to ask about stopping both metoprolol and amitriptyline as he is not sure of benefit but I think this may be due to him being further out from the reported improvement at last visit. I encouraged him to continue current regimen. Potentially could trial off amitriptyline at future visit for 2-3 week period to see how he does with migraines/potentailly headaches as well.

## 2017-02-14 NOTE — Patient Instructions (Addendum)
I would continue same migraine meds though amitriptyline likely helping more with sleep than migraines.   Continue to work on weight loss  Suspect left ankle sprain (though unclear when injury occurred). I would use rehab exercises about 3x a week. Happy to do a sports medicine referral for you when you return if not improving or worsens  See handout for behavioral health- this seems like the perfect year to get plugged back into a counselor  ______________________________________________________________________  Starting October 1st 2018, I will be transferring to our new location: [facility] (corner of [address] and [address] from [employer]) [place], [place] Phone: [phone]  I would love to have you remain my patient at this new location as long as it remains convenient for you. I am excited about the opportunity to have x-ray and sports medicine in the new building but will really miss the awesome staff and physicians at [facility]. Continue to schedule appointments at [facility] and we will automatically transfer them to the [place] location starting October 1st.

## 2017-02-14 NOTE — Assessment & Plan Note (Signed)
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 Quickly recovered with care from plane staff. Happened over a month ago with no recurrence and no chest pain, shortness of breath.  A/P: suspect vasovagal syncope from being overheated and cramped in combination with orthostatic hypotension when he stood quickly. He agrees to follow up for recurrent symptoms but will monitor only for now

## 2017-02-14 NOTE — Assessment & Plan Note (Signed)
S: poorly controlled on no rx. No myalgias. Goal was more weight loss but unfortunately up a few lbs today.  Lab Results  Component Value Date   CHOL 242 (H) 07/26/2016   HDL 60.30 07/26/2016   LDLCALC 160 (H) 07/26/2016   LDLDIRECT 135.1 10/28/2013   TRIG 110.0 07/26/2016   CHOLHDL 4 07/26/2016   A/P: we discussed continued lifestyle changes. 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 Will follow up in 6 months

## 2017-02-24 ENCOUNTER — Encounter: Payer: Self-pay | Admitting: Gastroenterology

## 2017-03-13 ENCOUNTER — Other Ambulatory Visit: Payer: Self-pay | Admitting: Family Medicine

## 2017-04-25 ENCOUNTER — Ambulatory Visit (AMBULATORY_SURGERY_CENTER): Payer: Self-pay

## 2017-04-25 VITALS — Ht 70.0 in | Wt 187.4 lb

## 2017-04-25 DIAGNOSIS — Z8371 Family history of colonic polyps: Secondary | ICD-10-CM

## 2017-04-25 DIAGNOSIS — Z86010 Personal history of colonic polyps: Secondary | ICD-10-CM

## 2017-04-25 MED ORDER — NA SULFATE-K SULFATE-MG SULF 17.5-3.13-1.6 GM/177ML PO SOLN: ORAL | 0 refills | Status: DC

## 2017-04-25 NOTE — Progress Notes (Signed)
Per pt, no allergies to soy or egg products.Pt not taking any weight loss meds or using  O2 at home.  Pt refused Emmi video.

## 2017-04-27 ENCOUNTER — Encounter: Payer: Self-pay | Admitting: Gastroenterology

## 2017-05-09 ENCOUNTER — Encounter: Payer: Self-pay | Admitting: Gastroenterology

## 2017-05-09 ENCOUNTER — Ambulatory Visit (AMBULATORY_SURGERY_CENTER): Payer: BC Managed Care – PPO | Admitting: Gastroenterology

## 2017-05-09 VITALS — BP 109/77 | HR 61 | Temp 97.3°F | Resp 18 | Ht 70.0 in | Wt 187.0 lb

## 2017-05-09 DIAGNOSIS — D124 Benign neoplasm of descending colon: Secondary | ICD-10-CM

## 2017-05-09 DIAGNOSIS — K5150 Left sided colitis without complications: Secondary | ICD-10-CM | POA: Diagnosis not present

## 2017-05-09 MED ORDER — SODIUM CHLORIDE 0.9 % IV SOLN: 500.0000 mL | INTRAVENOUS | Status: DC

## 2017-05-09 NOTE — Progress Notes (Signed)
Called to room to assist during endoscopic procedure.  Patient ID and intended procedure confirmed with present staff. Received instructions for my participation in the procedure from the performing physician.  

## 2017-05-09 NOTE — Progress Notes (Signed)
Pt. Reports no change in his surgical or medical history since his pre-visit.

## 2017-05-09 NOTE — Progress Notes (Signed)
Spontaneous respirations throughout. VSS. Resting comfortably. To PACU on room air. Report to  [facility].

## 2017-05-09 NOTE — Patient Instructions (Signed)
   Information on polyps and hemorrhoids given to you today  AWAIT PATHOLOGY RESULTS ON BIOPSIES TAKEN AND OF POLYP REMOVED  CONTINUE CURRENT MEDICATIONS      YOU HAD AN ENDOSCOPIC PROCEDURE TODAY AT [facility]:   Refer to the procedure report that was given to you for any specific questions about what was found during the examination.  If the procedure report does not answer your questions, please call your gastroenterologist to clarify.  If you requested that your care partner not be given the details of your procedure findings, then the procedure report has been included in a sealed envelope for you to review at your convenience later.  YOU SHOULD EXPECT: Some feelings of bloating in the abdomen. Passage of more gas than usual.  Walking can help get rid of the air that was put into your GI tract during the procedure and reduce the bloating. If you had a lower endoscopy (such as a colonoscopy or flexible sigmoidoscopy) you may notice spotting of blood in your stool or on the toilet paper. If you underwent a bowel prep for your procedure, you may not have a normal bowel movement for a few days.  Please Note:  You might notice some irritation and congestion in your nose or some drainage.  This is from the oxygen used during your procedure.  There is no need for concern and it should clear up in a day or so.  SYMPTOMS TO REPORT IMMEDIATELY:   Following lower endoscopy (colonoscopy or flexible sigmoidoscopy):  Excessive amounts of blood in the stool  Significant tenderness or worsening of abdominal pains  Swelling of the abdomen that is new, acute  Fever of 100F or higher    For urgent or emergent issues, a gastroenterologist can be reached at any hour by calling [phone].   DIET:  We do recommend a small meal at first, but then you may proceed to your regular diet.  Drink plenty of fluids but you should avoid alcoholic beverages for 24 hours.  ACTIVITY:  You  should plan to take it easy for the rest of today and you should NOT DRIVE or use heavy machinery until tomorrow (because of the sedation medicines used during the test).    FOLLOW UP: Our staff will call the number listed on your records the next business day following your procedure to check on you and address any questions or concerns that you may have regarding the information given to you following your procedure. If we do not reach you, we will leave a message.  However, if you are feeling well and you are not experiencing any problems, there is no need to return our call.  We will assume that you have returned to your regular daily activities without incident.  If any biopsies were taken you will be contacted by phone or by letter within the next 1-3 weeks.  Please call us at [phone] if you have not heard about the biopsies in 3 weeks.    SIGNATURES/CONFIDENTIALITY: You and/or your care partner have signed paperwork which will be entered into your electronic medical record.  These signatures attest to the fact that that the information above on your After Visit Summary has been reviewed and is understood.  Full responsibility of the confidentiality of this discharge information lies with you and/or your care-partner.

## 2017-05-09 NOTE — Op Note (Signed)
[facility] Patient Name: [name] Procedure Date: 05/09/2017 10:28 AM MRN: [id number] Endoscopist: Ladene Artist , MD Age: 56 Referring MD:  Date of Birth: 05-May-1961 Gender: Male Account #: [id number] Procedure:                Colonoscopy Indications:              High risk colon cancer surveillance: Ulcerative                            left sided colitis Medicines:                Monitored Anesthesia Care Procedure:                Pre-Anesthesia Assessment:                           - Prior to the procedure, a History and Physical                            was performed, and patient medications and                            allergies were reviewed. The patient's tolerance of                            previous anesthesia was also reviewed. The risks                            and benefits of the procedure and the sedation                            options and risks were discussed with the patient.                            All questions were answered, and informed consent                            was obtained. Prior Anticoagulants: The patient has                            taken no previous anticoagulant or antiplatelet                            agents. ASA Grade Assessment: II - A patient with                            mild systemic disease. After reviewing the risks                            and benefits, the patient was deemed in                            satisfactory condition to undergo the procedure.  After obtaining informed consent, the colonoscope                            was passed under direct vision. Throughout the                            procedure, the patient's blood pressure, pulse, and                            oxygen saturations were monitored continuously. The                            Colonoscope was introduced through the anus and                            advanced to the the cecum, identified by                          appendiceal orifice and ileocecal valve. The                            ileocecal valve, appendiceal orifice, and rectum                            were photographed. The quality of the bowel                            preparation was good. The colonoscopy was performed                            without difficulty. The patient tolerated the                            procedure well. Scope In: 10:37:57 AM Scope Out: 10:52:58 AM Scope Withdrawal Time: 0 hours 12 minutes 23 seconds  Total Procedure Duration: 0 hours 15 minutes 1 second  Findings:                 The perianal and digital rectal examinations were                            normal.                           A 6 mm polyp was found in the descending colon. The                            polyp was sessile. The polyp was removed with a                            cold snare. Resection and retrieval were complete.                           The exam was otherwise normal throughout the  examined colon. Random biopsies obtained.                           Inflammation characterized by erythema, granularity                            and loss of vascularity was found in the rectum.                            This was mild in severity and localized. Biopsies                            were taken with a cold forceps for histology.                           Internal hemorrhoids were found during                            retroflexion. The hemorrhoids were small and Grade                            I (internal hemorrhoids that do not prolapse). Complications:            No immediate complications. Estimated blood loss:                            None. Estimated Blood Loss:     Estimated blood loss: none. Impression:               - One 6 mm polyp in the descending colon, removed                            with a cold snare. Resected and retrieved.                           - Proctitis.  Inflammation was found. This was mild                            in severity. Biopsied.                           - Internal hemorrhoids. Recommendation:           - Repeat colonoscopy in 3 years for surveillance.                           - Patient has a contact number available for                            emergencies. The signs and symptoms of potential                            delayed complications were discussed with the                            patient. Return  to normal activities tomorrow.                            Written discharge instructions were provided to the                            patient.                           - Resume previous diet.                           - Continue present medications.                           - Await pathology results. Ladene Artist, MD 05/09/2017 10:57:17 AM This report has been signed electronically.

## 2017-05-10 ENCOUNTER — Telehealth: Payer: Self-pay | Admitting: *Deleted

## 2017-05-10 ENCOUNTER — Other Ambulatory Visit: Payer: Self-pay | Admitting: Family Medicine

## 2017-05-10 NOTE — Telephone Encounter (Signed)
  Follow up Call-  Call back number 05/09/2017  Post procedure Call Back phone  # [phone]  Permission to leave phone message Yes  Some recent data might be hidden     Patient questions:  Do you have a fever, pain , or abdominal swelling? No. Pain Score  0 *  Have you tolerated food without any problems? Yes.    Have you been able to return to your normal activities? Yes.    Do you have any questions about your discharge instructions: Diet   No. Medications  No. Follow up visit  No.  Do you have questions or concerns about your Care? No.  Actions: * If pain score is 4 or above: No action needed, pain <4.

## 2017-05-21 ENCOUNTER — Encounter: Payer: Self-pay | Admitting: Gastroenterology

## 2017-05-22 ENCOUNTER — Encounter: Payer: Self-pay | Admitting: Family Medicine

## 2017-05-22 DIAGNOSIS — Z86010 Personal history of colonic polyps: Secondary | ICD-10-CM | POA: Insufficient documentation

## 2017-07-05 ENCOUNTER — Other Ambulatory Visit: Payer: Self-pay | Admitting: Family Medicine

## 2017-07-14 ENCOUNTER — Encounter: Payer: Self-pay | Admitting: Family Medicine

## 2017-08-17 ENCOUNTER — Encounter: Payer: Self-pay | Admitting: Family Medicine

## 2017-08-17 ENCOUNTER — Ambulatory Visit (INDEPENDENT_AMBULATORY_CARE_PROVIDER_SITE_OTHER): Payer: BC Managed Care – PPO | Admitting: Family Medicine

## 2017-08-17 VITALS — BP 112/82 | HR 83 | Temp 98.6°F | Ht 70.0 in | Wt 176.8 lb

## 2017-08-17 DIAGNOSIS — E785 Hyperlipidemia, unspecified: Secondary | ICD-10-CM

## 2017-08-17 DIAGNOSIS — Z0000 Encounter for general adult medical examination without abnormal findings: Secondary | ICD-10-CM | POA: Diagnosis not present

## 2017-08-17 DIAGNOSIS — G43119 Migraine with aura, intractable, without status migrainosus: Secondary | ICD-10-CM | POA: Diagnosis not present

## 2017-08-17 DIAGNOSIS — E559 Vitamin D deficiency, unspecified: Secondary | ICD-10-CM | POA: Diagnosis not present

## 2017-08-17 DIAGNOSIS — G43919 Migraine, unspecified, intractable, without status migrainosus: Secondary | ICD-10-CM | POA: Diagnosis not present

## 2017-08-17 DIAGNOSIS — Z125 Encounter for screening for malignant neoplasm of prostate: Secondary | ICD-10-CM

## 2017-08-17 LAB — COMPREHENSIVE METABOLIC PANEL
ALK PHOS: 33 U/L — AB (ref 39–117)
ALT: 9 U/L (ref 0–53)
AST: 13 U/L (ref 0–37)
Albumin: 4.7 g/dL (ref 3.5–5.2)
BILIRUBIN TOTAL: 0.9 mg/dL (ref 0.2–1.2)
BUN: 16 mg/dL (ref 6–23)
CALCIUM: 9.7 mg/dL (ref 8.4–10.5)
CO2: 31 mEq/L (ref 19–32)
Chloride: 104 mEq/L (ref 96–112)
Creatinine, Ser: 1.18 mg/dL (ref 0.40–1.50)
GFR: 68.16 mL/min (ref >60.00)
GLUCOSE: 109 mg/dL — AB (ref 70–99)
Potassium: 4.5 mEq/L (ref 3.5–5.1)
Sodium: 140 mEq/L (ref 135–145)
TOTAL PROTEIN: 7 g/dL (ref 6.0–8.3)

## 2017-08-17 LAB — CBC
HCT: 43.5 % (ref 39.0–52.0)
HEMOGLOBIN: 14.7 g/dL (ref 13.0–17.0)
MCHC: 33.7 g/dL (ref 30.0–36.0)
MCV: 89.5 fl (ref 78.0–100.0)
Platelets: 213 10*3/uL (ref 150.0–400.0)
RBC: 4.86 Mil/uL (ref 4.22–5.81)
RDW: 13.1 % (ref 11.5–15.5)
WBC: 4.3 10*3/uL (ref 4.0–10.5)

## 2017-08-17 LAB — LIPID PANEL
CHOLESTEROL: 236 mg/dL — AB (ref 0–200)
HDL: 67.2 mg/dL (ref >39.00)
LDL Cholesterol: 157 mg/dL — ABNORMAL HIGH (ref 0–99)
NONHDL: 168.8
Total CHOL/HDL Ratio: 4
Triglycerides: 60 mg/dL (ref 0.0–149.0)
VLDL: 12 mg/dL (ref 0.0–40.0)

## 2017-08-17 LAB — VITAMIN D 25 HYDROXY (VIT D DEFICIENCY, FRACTURES): VITD: 27.51 ng/mL — AB (ref 30.00–100.00)

## 2017-08-17 LAB — PSA: PSA: 0.33 ng/mL (ref 0.10–4.00)

## 2017-08-17 MED ORDER — OXYCODONE-ACETAMINOPHEN 5-325 MG PO TABS: 1.0000 | ORAL_TABLET | Freq: Four times a day (QID) | ORAL | 0 refills | Status: DC | PRN

## 2017-08-17 NOTE — Assessment & Plan Note (Addendum)
Migraines- on amitriptyline 24m and metoprolol 25 mg BID. Initially with metoprolol reported 25% improvement- he has not been sure how much amitriptyline is helping but also helps with sleep. Last visit- 1-2x a month migraines severe- mild pain 2-3x a week. imitrex helps.   Today, states having fewer mirgraines. startes out with tension in back of scalp then comes up to the eye. Cutting sumatriptan in half. Still about 2-3x a week mild- has had a few longer ones that are more intractable. Very rare oxycodone #5 over a year. NCCSRS reviewed and low risk

## 2017-08-17 NOTE — Assessment & Plan Note (Signed)
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 . Has been off krill oil for 2 weeks- plans to go back on as eye doctor states helps with his eyes staying moist

## 2017-08-17 NOTE — Progress Notes (Addendum)
Phone: [phone]  Subjective:  Patient presents today for their annual physical. Chief complaint-noted.   See problem oriented charting- ROS- full  review of systems was completed and negative except for:  Congestion when working out, tinnitus, hearing loss per baseline with aids, constipation, joint pain, insomnia at times, seasonal allergies, headaches with known migraines  The following were reviewed and entered/updated in epic: Past Medical History:  Diagnosis Date  . Allergic rhinitis   . Allergy   . Arthritis   . GERD (gastroesophageal reflux disease)   . Heart murmur    tricuspid valve regurgitation  . Internal hemorrhoids   . Migraines   . Panic attack    occasional  . Syncope    on airplane in 12/2016/ due to stress  . 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 ULCERATIVE COLITIS-LEFT SIDE 05/06/2008    Priority: High  . 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 Hyperlipidemia 05/15/2007    Priority: Medium  . Migraines 05/03/2007    Priority: Medium  . Low back pain 02/09/2016    Priority: Low  . 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 Vitamin D deficiency 10/27/2009    Priority: Low  . GERD 01/31/2008    Priority: Low  . INTERNAL HEMORRHOIDS 01/23/2008    Priority: Low  . Allergic rhinitis 05/03/2007    Priority: Low  . History of adenomatous polyp of colon 05/22/2017  . Vasovagal syncope 02/14/2017  . Hearing loss 08/04/2016  . DOE (dyspnea on exertion) 04/15/2014   Past Surgical History:  Procedure Laterality Date  . ADENOIDECTOMY     as a child  . COLONOSCOPY    . 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 UPPER GASTROINTESTINAL ENDOSCOPY      Family History  Problem Relation Age of Onset  . Thyroid disease Father   . Heart disease Father        a fib, expanded aorta  . Arthritis Mother   . Colon cancer Unknown    Grandmother's Family (x 1 brother to grandmother and 1 sister to grandmother)  . Crohn's disease Paternal Aunt   . Crohn's disease Cousin   . Colon polyps Maternal Grandmother        Great Aunt, Great Uncle  . Heart disease Brother   . Esophageal cancer Neg Hx   . Stomach cancer Neg Hx   . Rectal cancer Neg Hx     Medications- reviewed and updated Current Outpatient Medications  Medication Sig Dispense Refill  . amitriptyline (ELAVIL) 10 MG tablet TAKE 1 TABLET AT BEDTIME 90 tablet 3  . balsalazide (COLAZAL) 750 MG capsule TAKE 3 CAPSULES (=2250MG) 3TIMES A DAY. (Patient taking differently: TAKE 2 capsules in am and 2 capsules at night) 270 capsule 4  . cetirizine (ZYRTEC) 10 MG tablet Take 10 mg by mouth daily.    . diphenhydrAMINE (BENADRYL) 25 MG tablet Take 25 mg by mouth at bedtime as needed.    . fluticasone (FLONASE) 50 MCG/ACT nasal spray USE 1 SPRAY IN EACH NOSTRILTWICE DAILY 48 g 3  . ibuprofen (ADVIL,MOTRIN) 200 MG tablet Take 200 mg by mouth as needed.      . Melatonin 1 MG TABS Take by mouth at bedtime.    . metoprolol tartrate (LOPRESSOR) 25 MG tablet TAKE 1 TABLET TWICE A DAY 180 tablet 3  . Naftifine  HCl 2 % GEL Apply 1 application topically 2 (two) times daily. 60 g 2  . promethazine (PHENERGAN) 12.5 MG tablet Take 1 tablet (12.5 mg total) by mouth every 6 (six) hours as needed for nausea or vomiting. 30 tablet 0  . SUMAtriptan (IMITREX) 100 MG tablet TAKE 1 TABLET EVERY 2 HOURSAS NEEDED FOR MIGRAINE (2  TABLETS MAXIMUMPER DAY) 9 tablet 3  . cyclobenzaprine (FLEXERIL) 10 MG tablet Take 1 tablet (10 mg total) by mouth 3 (three) times daily as needed for muscle spasms. (Patient not taking: Reported on 02/14/2017) 30 tablet 0  . KRILL OIL 1000 MG CAPS Take 1 capsule by mouth daily.    . mesalamine (CANASA) 1000 MG suppository Place 1 suppository (1,000 mg total) rectally at bedtime. (Patient not taking: Reported on 08/17/2017) 90 suppository 0  . oxyCODONE-acetaminophen  (PERCOCET/ROXICET) 5-325 MG tablet Take 1 tablet every 6 (six) hours as needed by mouth for severe pain (for migraine not controlled by imitrex). 5 tablet 0   No current facility-administered medications for this visit.     Allergies-reviewed and updated Allergies  Allergen Reactions  . Shellfish Allergy Nausea And Vomiting    scallops  . Citrus     Oral pain, rash in mouth  . Codeine Nausea Only  . Salmon [Fish Allergy] Nausea Only and Rash    Especially smoked salmon    Social History   Socioeconomic History  . Marital status: Married    Spouse name: Domestic partner  . Number of children: None  . Years of education: None  . Highest education level: None  Social Needs  . Financial resource strain: None  . Food insecurity - worry: None  . Food insecurity - inability: None  . Transportation needs - medical: None  . Transportation needs - non-medical: None  Occupational History  . Occupation: Secretary/administrator professor    Employer: [employer]  Tobacco Use  . Smoking status: Never Smoker  . Smokeless tobacco: Never Used  Substance and Sexual Activity  . Alcohol use: Yes    Alcohol/week: 0.0 oz    Comment: 1-2 a day/14 a week  . Drug use: No  . Sexual activity: None  Other Topics Concern  . None  Social History Narrative   Lives with husband Dwaine Deter also a patient of Dr. Yong Channel). No pets or adopted children or natural children.       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       Hobbies: walking, hiking, reading, music, cooking , gardening.     Objective: BP 112/82 (BP Location: Left Arm, Patient Position: Sitting, Cuff Size: Large)   Pulse 83   Temp 98.6 F (37 C) (Oral)   Ht 5' 10"  (1.778 m)   Wt 176 lb 12.8 oz (80.2 kg)   SpO2 96%   BMI 25.37 kg/m  Gen: NAD, resting comfortably HEENT: Mucous membranes are moist. Oropharynx normal Neck: no thyromegaly CV: RRR no murmurs rubs or gallops Lungs: CTAB no crackles, wheeze, rhonchi Abdomen:  soft/nontender/nondistended/normal bowel sounds. No rebound or guarding.  Ext: no edema Skin: warm, dry, brown macule right abdomen under 6 mm. Underneath some thickening to skin.  Neuro: grossly normal, moves all extremities, PERRLA Rectal: normal tone, normal sized prostate, no masses or tenderness  Assessment/Plan:  56 y.o. male presenting for annual physical.  Health Maintenance counseling: 1. Anticipatory guidance: Patient counseled regarding regular dental exams q6 months, eye exams yearly, wearing seatbelts.  2. Risk factor reduction:  Advised patient  of need for regular exercise and diet rich and fruits and vegetables to reduce risk of heart attack and stroke. Exercise- walking 20-25 mins twice a day last year- this year, keeping this up plus added gym 2-3x a week with trainer. Diet- reasonable. Likely without clothes normal BMI Wt Readings from Last 3 Encounters:  08/17/17 176 lb 12.8 oz (80.2 kg)  05/09/17 187 lb (84.8 kg)  04/25/17 187 lb 6.4 oz (85 kg)  3. Immunizations/screenings/ancillary studies- discussed shingrix availability issues. Would like to do this when available.  Immunization History  Administered Date(s) Administered  . Influenza Whole 07/31/2009, 06/16/2010  . Influenza,inj,Quad PF,6+ Mos 06/30/2015  . Influenza-Unspecified 07/21/2016, 07/10/2017  . Td 05/09/2006  . Tdap 11/01/2013  4. Prostate cancer screening-   Low risk PSA trend in past, update today. Low risk rectal Lab Results  Component Value Date   PSA 0.31 07/26/2016   PSA 0.28 10/28/2013   PSA 0.28 10/29/2012   5. Colon cancer screening - 05/09/17 with ulcerative colitis and polyp- q 3 year.  6. Skin cancer screening- has seen dermatology in the past and within the year. advised regular sunscreen use. Denies worrisome, changing, or new skin lesions. Wants me to look at one area- I advised dermatology follow up if area grows further but I suspect mole with cyst underneath- he has had this  before  Status of chronic or acute concerns   Ulcerative colitis- continues GI follow up and is on balsalazide and canasa suppositories. No discharge since spring  Anxiety- had agreed to get plugged bck into behavioral health. Exercise somewhat helpful- has not seen them.   seasonal allergies- zyrtec and flonase- doing well  Vitamin D deficiency- No longer on vitamin D will update today  MSK issues 1. 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 Discussed Aspercreme or turmeric trial. Could use pennsaid trial in the future.  2. Sees podiatry for neuroma in the past. Has been doing better 3. Left ankle instability 6 months last visit. Gave home exercises. Doing better 4. 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 . Has been off krill oil for 2 weeks- plans to go back on as eye doctor states helps with his eyes staying moist   Migraines Migraines- on amitriptyline 29m and metoprolol 25 mg BID. Initially with metoprolol reported 25% improvement- he has not been sure how much amitriptyline is helping but also helps with sleep. Last visit- 1-2x a month migraines severe- mild pain 2-3x a week. imitrex helps.   Today, states having fewer mirgraines. startes out with tension in back of scalp then comes up to the eye. Cutting sumatriptan in half. Still about 2-3x a week mild- has had a few longer ones that are more intractable. Very rare oxycodone #5 over a year. NCCSRS reviewed and low risk  1 year physical. Happy to check in 6 months from now if you would like.   Orders Placed This Encounter  Procedures  . CBC    [place]  . Lipid panel    [place]    Order Specific Question:   Has the patient fasted?    Answer:   No  . PSA  . Comprehensive metabolic panel        Order Specific Question:   Has the patient fasted?     Answer:  No  . VITAMIN D 25 Hydroxy (Vit-D Deficiency, Fractures)    [place]   Meds ordered this encounter  Medications  . oxyCODONE-acetaminophen (PERCOCET/ROXICET) 5-325 MG tablet    Sig: Take 1 tablet every 6 (six) hours as needed by mouth for severe pain (for migraine not controlled by imitrex).    Dispense:  5 tablet    Refill:  0    Return precautions advised.  [name], MD

## 2017-08-17 NOTE — Patient Instructions (Addendum)
If area on stomach grows further- would have you go back in to check with dermatology but I think this likely is a superficial mole along with a cyst or small lipoma  Great job at the gym!  Wt Readings from Last 3 Encounters:  08/17/17 176 lb 12.8 oz (80.2 kg)  05/09/17 187 lb (84.8 kg)  04/25/17 187 lb 6.4 oz (85 kg)   Trial aspercreme for thumb- could consider topical NSAID like Pennsaid in future

## 2017-08-18 ENCOUNTER — Encounter: Payer: Self-pay | Admitting: Family Medicine

## 2017-09-27 ENCOUNTER — Other Ambulatory Visit: Payer: Self-pay | Admitting: Family Medicine

## 2017-10-13 ENCOUNTER — Other Ambulatory Visit: Payer: Self-pay

## 2017-10-13 ENCOUNTER — Other Ambulatory Visit: Payer: Self-pay | Admitting: Family Medicine

## 2017-10-13 MED ORDER — MESALAMINE 1000 MG RE SUPP: 1000.0000 mg | Freq: Every day | RECTAL | 0 refills | Status: DC

## 2017-10-16 ENCOUNTER — Other Ambulatory Visit: Payer: Self-pay | Admitting: Family Medicine

## 2018-01-08 ENCOUNTER — Other Ambulatory Visit: Payer: Self-pay | Admitting: Family Medicine

## 2018-01-24 ENCOUNTER — Encounter: Payer: Self-pay | Admitting: Family Medicine

## 2018-01-24 ENCOUNTER — Ambulatory Visit: Payer: BC Managed Care – PPO | Admitting: Family Medicine

## 2018-01-24 VITALS — BP 122/86 | HR 64 | Temp 98.1°F | Ht 70.0 in | Wt 184.6 lb

## 2018-01-24 DIAGNOSIS — K5150 Left sided colitis without complications: Secondary | ICD-10-CM

## 2018-01-24 DIAGNOSIS — R1011 Right upper quadrant pain: Secondary | ICD-10-CM

## 2018-01-24 DIAGNOSIS — R739 Hyperglycemia, unspecified: Secondary | ICD-10-CM

## 2018-01-24 LAB — COMPREHENSIVE METABOLIC PANEL
ALT: 9 U/L (ref 0–53)
AST: 13 U/L (ref 0–37)
Albumin: 4.7 g/dL (ref 3.5–5.2)
Alkaline Phosphatase: 38 U/L — ABNORMAL LOW (ref 39–117)
BILIRUBIN TOTAL: 1.5 mg/dL — AB (ref 0.2–1.2)
BUN: 14 mg/dL (ref 6–23)
CALCIUM: 9.8 mg/dL (ref 8.4–10.5)
CHLORIDE: 101 meq/L (ref 96–112)
CO2: 31 meq/L (ref 19–32)
Creatinine, Ser: 1.17 mg/dL (ref 0.40–1.50)
GFR: 68.73 mL/min (ref >60.00)
Glucose, Bld: 61 mg/dL — ABNORMAL LOW (ref 70–99)
Potassium: 4.4 mEq/L (ref 3.5–5.1)
Sodium: 140 mEq/L (ref 135–145)
Total Protein: 7 g/dL (ref 6.0–8.3)

## 2018-01-24 LAB — CBC
HCT: 44.7 % (ref 39.0–52.0)
Hemoglobin: 15.4 g/dL (ref 13.0–17.0)
MCHC: 34.5 g/dL (ref 30.0–36.0)
MCV: 87.2 fl (ref 78.0–100.0)
Platelets: 220 10*3/uL (ref 150.0–400.0)
RBC: 5.13 Mil/uL (ref 4.22–5.81)
RDW: 13.3 % (ref 11.5–15.5)
WBC: 5.7 10*3/uL (ref 4.0–10.5)

## 2018-01-24 LAB — HEMOGLOBIN A1C: Hgb A1c MFr Bld: 5.1 % (ref 4.6–6.5)

## 2018-01-24 LAB — SEDIMENTATION RATE: Sed Rate: 1 mm/hr (ref 0–20)

## 2018-01-24 MED ORDER — OMEPRAZOLE 40 MG PO CPDR: 40.0000 mg | DELAYED_RELEASE_CAPSULE | Freq: Every day | ORAL | 1 refills | Status: DC

## 2018-01-24 NOTE — Patient Instructions (Addendum)
I suspect your duodenum is irritated - possible ulcer. Lets try prilosec 25m for a month. If improving but not gone at a month can go to 2 months.   Call Dr. SFuller Planfor follow up sometime in the next month in case not improving but if symptoms resolve you may cancel (try to do this at least a week out if possible)  I will go ahead and update Dr. SFuller Planthrough our computer system today  Please stop by lab before you go

## 2018-01-24 NOTE — Progress Notes (Signed)
Subjective:  [name] is a 57 y.o. year old very pleasant male patient who presents for/with See problem oriented charting ROS-no fever or chills.  No vomiting.  Does report some abdominal pain.  Does report some mucus in stools  Past Medical History-  Patient Active Problem List   Diagnosis Date Noted  . ULCERATIVE COLITIS-LEFT SIDE 05/06/2008    Priority: High  . 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 History of adenomatous polyp of colon 05/22/2017    Priority: Medium  . 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 Hyperlipidemia 05/15/2007    Priority: Medium  . Migraines 05/03/2007    Priority: Medium  . Vasovagal syncope 02/14/2017    Priority: Low  . Hearing loss 08/04/2016    Priority: Low  . Low back pain 02/09/2016    Priority: Low  . 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 Vitamin D deficiency 10/27/2009    Priority: Low  . GERD 01/31/2008    Priority: Low  . INTERNAL HEMORRHOIDS 01/23/2008    Priority: Low  . Allergic rhinitis 05/03/2007    Priority: Low  . Hyperglycemia 01/26/2018  . DOE (dyspnea on exertion) 04/15/2014    Medications- reviewed and updated Current Outpatient Medications  Medication Sig Dispense Refill  . amitriptyline (ELAVIL) 10 MG tablet TAKE 1 TABLET AT BEDTIME 90 tablet 3  . balsalazide (COLAZAL) 750 MG capsule TAKE 3 CAPSULES (=2250MG) 3TIMES A DAY. 270 capsule 4  . cetirizine (ZYRTEC) 10 MG tablet Take 10 mg by mouth daily.    . cyclobenzaprine (FLEXERIL) 10 MG tablet Take 1 tablet (10 mg total) by mouth 3 (three) times daily as needed for muscle spasms. (Patient not taking: Reported on 02/14/2017) 30 tablet 0  . diphenhydrAMINE (BENADRYL) 25 MG tablet Take 25 mg by mouth at bedtime as needed.    . fluticasone (FLONASE) 50 MCG/ACT nasal spray USE 1 SPRAY IN EACH NOSTRILTWICE DAILY 48 g 3  . ibuprofen (ADVIL,MOTRIN) 200 MG tablet Take 200 mg by mouth as needed.      Marland Kitchen KRILL OIL  1000 MG CAPS Take 1 capsule by mouth daily.    . Melatonin 1 MG TABS Take by mouth at bedtime.    . mesalamine (CANASA) 1000 MG suppository Place 1 suppository (1,000 mg total) rectally at bedtime. 90 suppository 0  . metoprolol tartrate (LOPRESSOR) 25 MG tablet TAKE 1 TABLET TWICE A DAY 180 tablet 3  . Naftifine HCl 2 % GEL Apply 1 application topically 2 (two) times daily. 60 g 2  . oxyCODONE-acetaminophen (PERCOCET/ROXICET) 5-325 MG tablet Take 1 tablet every 6 (six) hours as needed by mouth for severe pain (for migraine not controlled by imitrex). 5 tablet 0  . promethazine (PHENERGAN) 12.5 MG tablet Take 1 tablet (12.5 mg total) by mouth every 6 (six) hours as needed for nausea or vomiting. 30 tablet 0  . SUMAtriptan (IMITREX) 100 MG tablet TAKE 1 TABLET EVERY 2 HOURSAS NEEDED FOR MIGRAINE (2  TABLETS MAXIMUMPER DAY) 9 tablet 3   No current facility-administered medications for this visit.     Objective: BP 122/86 (BP Location: Left Arm, Patient Position: Sitting, Cuff Size: Large)   Pulse 64   Temp 98.1 F (36.7 C) (Oral)   Ht 5' 10"  (1.778 m)   Wt 184 lb 9.6 oz (83.7 kg)   SpO2 98%   BMI 26.49 kg/m  Gen: NAD, resting comfortably CV: RRR no  murmurs rubs or gallops Lungs: CTAB no crackles, wheeze, rhonchi Abdomen: soft/mild right upper quadrant pain but otherwise no pain throughout abdomen.nondistended/normal bowel sounds. No rebound or guarding.  Ext: no edema Skin: warm, dry  Assessment/Plan:  Right upper quadrant abdominal pain S:  right upper quadrant pain for 3.5 weeks. First time diagnosed- had ultrasound, barium test, endoscopy- duodenitis was diagnosed at that time. Was diagnosed H. Pylori at that time- and treated with triple therapy but aparently later blood test showed no H. Pylori. Has been off prilosec for 6-7 years. He notes pain wakes him up at times or tends to be worse somewhere in between meals though he is not sure if it is better with food  Patient does have  hsitory of ulcerative colitis and follows with GI- is on balsalazide and canasa suppositories (prn). No discharge or blood in stools typically. He is using suppositories as well as balsalazide-   Also having potential colitis flare which may or may not be related. Had very large bowel movement a few weeks ago after RUQ pain started. Also had large cupful of white discharge/mucus and the next day he had blood. He started his canasa suppositories back and that has helped with abdominal cramping and not having more discharge.balsalazide - he had been on 2 capsules 2x a day- he has increased to 3 capsules 3x a day for most part  A/P: from avs "I suspect your duodenum is irritated - possible ulcer. Lets try prilosec 59m for a month. If improving but not gone at a month can go to 2 months.   Call Dr. SFuller Planfor follow up sometime in the next month in case not improving but if symptoms resolve you may cancel (try to do this at least a week out if possible)  I will go ahead and update Dr. SFuller Planthrough our computer system today"  ULCERATIVE COLITIS-LEFT SIDE Possible flare.  Now improving back on full dose of his baseline medications.  He has follow-up with Dr. SFuller Planin less than 2 weeks.  Of note ESR was not elevated though.  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  With other hepatic function test being normal-we will hold off on right upper quadrant ultrasound and repeat hepatic function tests as originally planned  Hyperglycemia CBG elevated on last fasting labs.  Fortunately hemoglobin A1c not elevated. Lab Results  Component Value Date   HGBA1C 5.1 01/24/2018   Future Appointments  Date Time Provider DCaldwell 02/06/2018  1:45 PM SLadene Artist MD [facility]   Lab/Order associations: RUQ pain - Plan: Sedimentation rate, CBC, Comprehensive metabolic panel  Hyperglycemia - Plan: Hemoglobin A1c  Meds ordered this  encounter  Medications  . omeprazole (PRILOSEC) 40 MG capsule    Sig: Take 1 capsule (40 mg total) by mouth daily.    Dispense:  30 capsule    Refill:  1   Return precautions advised.  SGarret Reddish MD

## 2018-01-25 ENCOUNTER — Other Ambulatory Visit: Payer: Self-pay

## 2018-01-25 MED ORDER — BALSALAZIDE DISODIUM 750 MG PO CAPS: ORAL_CAPSULE | ORAL | 1 refills | Status: DC

## 2018-01-26 ENCOUNTER — Encounter: Payer: Self-pay | Admitting: Family Medicine

## 2018-01-26 DIAGNOSIS — R739 Hyperglycemia, unspecified: Secondary | ICD-10-CM | POA: Insufficient documentation

## 2018-01-26 NOTE — Assessment & Plan Note (Signed)
CBG elevated on last fasting labs.  Fortunately hemoglobin A1c not elevated. Lab Results  Component Value Date   HGBA1C 5.1 01/24/2018

## 2018-01-26 NOTE — Assessment & Plan Note (Signed)
Possible flare.  Now improving back on full dose of his baseline medications.  He has follow-up with Dr. Fuller Plan in less than 2 weeks.  Of note ESR was not elevated though.

## 2018-01-26 NOTE — Assessment & Plan Note (Signed)
Noted elevated bilirubin on labs.  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  With other hepatic function test being normal-we will hold off on right upper quadrant ultrasound and repeat hepatic function tests as originally planned

## 2018-02-01 ENCOUNTER — Other Ambulatory Visit: Payer: Self-pay | Admitting: Family Medicine

## 2018-02-06 ENCOUNTER — Ambulatory Visit: Payer: BC Managed Care – PPO | Admitting: Gastroenterology

## 2018-02-06 ENCOUNTER — Encounter: Payer: Self-pay | Admitting: Gastroenterology

## 2018-02-06 VITALS — BP 106/72 | HR 76 | Ht 70.0 in | Wt 183.0 lb

## 2018-02-06 DIAGNOSIS — K5150 Left sided colitis without complications: Secondary | ICD-10-CM | POA: Diagnosis not present

## 2018-02-06 DIAGNOSIS — R1011 Right upper quadrant pain: Secondary | ICD-10-CM | POA: Diagnosis not present

## 2018-02-06 MED ORDER — OMEPRAZOLE 40 MG PO CPDR: 40.0000 mg | DELAYED_RELEASE_CAPSULE | Freq: Every day | ORAL | 0 refills | Status: DC

## 2018-02-06 NOTE — Progress Notes (Signed)
    History of Present Illness: This is a 57 year old male with recent intermittent RUQ pain and UC.  He relates several weeks of dull right upper quadrant achy pain that is mild in severity.  He began ranitidine on his own which did not impact symptoms.  He was evaluated by Dr. Yong Channel and was started on omeprazole 40 mg daily and he is really not noted any improvement on omeprazole.  His symptoms are bothersome at night and occasionally following meals.  About the same time his right upper quadrant pain developed he relates a flare of colitis with loose stools, mucus per rectum and one episode of minor rectal bleeding.  On his own he increased his balsalazide to the maximum dose and began Canasa suppositories. All LGI symptoms subsided after couple days.  He discontinued Canasa suppositories but has remained on maximum dose balsalazide CMP, CBC, ESR earlier in April were unremarkable.  He plans to [place] to [place] 6 [place], leaving on May 21.  Colonoscopy 04/2017 - One 6 mm polyp in the descending colon, removed with a cold snare. Resected and Retrieved. (tubular adenoma) - Proctitis. Inflammation was found. This was mild in severity. Biopsied. (normal mucosa) - Internal hemorrhoids.  Current Medications, Allergies, Past Medical History, Past Surgical History, Family History and Social History were reviewed in [employer] record.  Physical Exam: General: Well developed, well nourished, no acute distress Head: Normocephalic and atraumatic Eyes:  sclerae anicteric, EOMI Ears: Normal auditory acuity Mouth: No deformity or lesions Lungs: Clear throughout to auscultation Heart: Regular rate and rhythm; no murmurs, rubs or bruits Abdomen: Soft, non tender and non distended. No masses, hepatosplenomegaly or hernias noted. Normal Bowel sounds Rectal: not done Musculoskeletal: Symmetrical with no gross deformities  Pulses:  Normal pulses noted Extremities: No clubbing,  cyanosis, edema or deformities noted Neurological: Alert oriented x 4, grossly nonfocal Psychological:  Alert and cooperative. Normal mood and affect  Assessment and Recommendations:  1. Left sided UC.  Recent mild flare.  He self adjusted medications.  Continue balsalazide 750 mg 3 p.o. 3 times daily as a long-term maintenance dose. REV in 1 year.   2. RUQ pain.  Rule out gastritis, duodenitis, GERD.  Unlikely cholelithiasis.  Continue omeprazole 40 mg p.o. every morning and change ranitidine to 150 mg at bedtime.  Take an additional ranitidine 150 mg at another time during the day as needed.  If symptoms not improving by May 10 he is advised to call to schedule EGD and possibly abdominal ultrasound prior to his trip abroad.  If symptoms are improving continue current medications at least until he returns from travels abroad.

## 2018-02-06 NOTE — Patient Instructions (Addendum)
Continue omeprazole daily and ranitidine twice daily.   Call our office back by May 10th if your abdominal pain symptoms are no better.   Normal BMI (Body Mass Index- based on height and weight) is between 19 and 25. Your BMI today is Body mass index is 26.26 kg/m. Marland Kitchen Please consider follow up  regarding your BMI with your Primary Care Provider.  Thank you for choosing me and [facility].  Pricilla Riffle. Dagoberto Ligas., MD., Marval Regal

## 2018-02-15 ENCOUNTER — Other Ambulatory Visit: Payer: Self-pay | Admitting: Family Medicine

## 2018-02-16 ENCOUNTER — Encounter: Payer: Self-pay | Admitting: Family Medicine

## 2018-02-16 ENCOUNTER — Encounter: Payer: Self-pay | Admitting: Gastroenterology

## 2018-04-03 ENCOUNTER — Other Ambulatory Visit: Payer: Self-pay | Admitting: Family Medicine

## 2018-05-29 ENCOUNTER — Encounter: Payer: Self-pay | Admitting: Family Medicine

## 2018-06-03 ENCOUNTER — Encounter: Payer: Self-pay | Admitting: Family Medicine

## 2018-06-04 MED ORDER — SUMATRIPTAN SUCCINATE 100 MG PO TABS: ORAL_TABLET | ORAL | 1 refills | Status: DC

## 2018-06-07 ENCOUNTER — Encounter: Payer: Self-pay | Admitting: Family Medicine

## 2018-06-14 ENCOUNTER — Ambulatory Visit: Payer: BC Managed Care – PPO | Admitting: Family Medicine

## 2018-06-14 ENCOUNTER — Encounter: Payer: Self-pay | Admitting: Family Medicine

## 2018-06-14 VITALS — BP 122/84 | HR 61 | Temp 98.0°F | Ht 70.0 in | Wt 184.4 lb

## 2018-06-14 DIAGNOSIS — R0789 Other chest pain: Secondary | ICD-10-CM | POA: Diagnosis not present

## 2018-06-14 DIAGNOSIS — Z8349 Family history of other endocrine, nutritional and metabolic diseases: Secondary | ICD-10-CM | POA: Diagnosis not present

## 2018-06-14 DIAGNOSIS — R1011 Right upper quadrant pain: Secondary | ICD-10-CM

## 2018-06-14 DIAGNOSIS — K219 Gastro-esophageal reflux disease without esophagitis: Secondary | ICD-10-CM

## 2018-06-14 LAB — COMPREHENSIVE METABOLIC PANEL
ALT: 15 U/L (ref 0–53)
AST: 21 U/L (ref 0–37)
Albumin: 5 g/dL (ref 3.5–5.2)
Alkaline Phosphatase: 36 U/L — ABNORMAL LOW (ref 39–117)
BILIRUBIN TOTAL: 1.4 mg/dL — AB (ref 0.2–1.2)
BUN: 11 mg/dL (ref 6–23)
CALCIUM: 10 mg/dL (ref 8.4–10.5)
CHLORIDE: 101 meq/L (ref 96–112)
CO2: 32 meq/L (ref 19–32)
Creatinine, Ser: 1.13 mg/dL (ref 0.40–1.50)
GFR: 71.44 mL/min (ref >60.00)
GLUCOSE: 90 mg/dL (ref 70–99)
POTASSIUM: 4.6 meq/L (ref 3.5–5.1)
Sodium: 140 mEq/L (ref 135–145)
Total Protein: 7.3 g/dL (ref 6.0–8.3)

## 2018-06-14 NOTE — Assessment & Plan Note (Signed)
S: upper abdominal symptoms. intermittend RUQ pain usually in the middle of the night or in the evening. Admits evening meal usually the largest.  Not having at the moment, not exertional He states symptoms worsened when stopped prilosec. Using prn zantac which helps some and symptoms not as persistent as they were before PPI A/P: Patient asks about going back to GI for endoscopy and ultrasound as prior discussed- I told him I thought a few week trial of scheduled zantac would be reasonable first step and to follow upw ith GI if not ipmroving

## 2018-06-14 NOTE — Patient Instructions (Addendum)
Please stop by lab before you go  Trial ranitidine 118m twice a day for next 2-3 weeks- if symptoms still come intermittently with this- would call Dr. SFuller Planfor next steps- sounds like endoscopy and ultrasound.   I want you to ice your chest in areas that are hurting 3x a day for 20 minutes. After 3 days I want you to try heat 3x a day for 20 minutes. Also try relative rest in the gym for these areas. Medicine options arent great- perhaps we could get you in with sports medicine if not improving within 2-3 weeks.

## 2018-06-14 NOTE — Progress Notes (Signed)
Subjective:  [name] is a 57 y.o. year old very pleasant male patient who presents for/with See problem oriented charting ROS- chest wall pain noted. Heaviness sensation in lungs with exercise. No edema. Does have RUQ pain at night after meals   Past Medical History-  Patient Active Problem List   Diagnosis Date Noted  . ULCERATIVE COLITIS-LEFT SIDE 05/06/2008    Priority: High  . 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 Hyperglycemia 01/26/2018    Priority: Medium  . History of adenomatous polyp of colon 05/22/2017    Priority: Medium  . 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 Hyperlipidemia 05/15/2007    Priority: Medium  . Migraines 05/03/2007    Priority: Medium  . Vasovagal syncope 02/14/2017    Priority: Low  . Hearing loss 08/04/2016    Priority: Low  . Low back pain 02/09/2016    Priority: Low  . DOE (dyspnea on exertion) 04/15/2014    Priority: Low  . 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 Vitamin D deficiency 10/27/2009    Priority: Low  . GERD 01/31/2008    Priority: Low  . INTERNAL HEMORRHOIDS 01/23/2008    Priority: Low  . Allergic rhinitis 05/03/2007    Priority: Low  . Family history of alpha 1 antitrypsin deficiency 06/14/2018    Medications- reviewed and updated Current Outpatient Medications  Medication Sig Dispense Refill  . amitriptyline (ELAVIL) 10 MG tablet TAKE 1 TABLET AT BEDTIME 90 tablet 3  . balsalazide (COLAZAL) 750 MG capsule TAKE 3 CAPSULES (=2250MG) 3TIMES A DAY. 810 capsule 1  . cetirizine (ZYRTEC) 10 MG tablet Take 10 mg by mouth daily.    . cyclobenzaprine (FLEXERIL) 10 MG tablet Take 1 tablet (10 mg total) by mouth 3 (three) times daily as needed for muscle spasms. 30 tablet 0  . diphenhydrAMINE (BENADRYL) 25 MG tablet Take 25 mg by mouth at bedtime as needed.    . fluticasone (FLONASE) 50 MCG/ACT nasal spray USE 1 SPRAY IN EACH NOSTRILTWICE DAILY 48 g 3  . ibuprofen  (ADVIL,MOTRIN) 200 MG tablet Take 200 mg by mouth as needed.      . Melatonin 1 MG TABS Take by mouth at bedtime.    . mesalamine (CANASA) 1000 MG suppository Place 1 suppository (1,000 mg total) rectally at bedtime. 90 suppository 0  . metoprolol tartrate (LOPRESSOR) 25 MG tablet TAKE 1 TABLET TWICE A DAY 180 tablet 3  . Naftifine HCl 2 % GEL Apply 1 application topically 2 (two) times daily. 60 g 2  . promethazine (PHENERGAN) 12.5 MG tablet Take 1 tablet (12.5 mg total) by mouth every 6 (six) hours as needed for nausea or vomiting. 30 tablet 0  . ranitidine (ZANTAC) 150 MG capsule Take 150 mg by mouth 2 (two) times daily.    . SUMAtriptan (IMITREX) 100 MG tablet TAKE 1 TABLET EVERY 2 HOURSAS NEEDED FOR MIGRAINE (2  TABLETS MAXIMUMPER DAY) 27 tablet 1   No current facility-administered medications for this visit.     Objective: BP 122/84 (BP Location: Left Arm, Patient Position: Sitting, Cuff Size: Large)   Pulse 61   Temp 98 F (36.7 C) (Oral)   Ht 5' 10"  (1.778 m)   Wt 184 lb 6.4 oz (83.6 kg)   SpO2 100%   BMI 26.46 kg/m  Gen: NAD, resting comfortably CV: RRR  Along chest wall to left of sternum has pain at junction  with several ribs. Some pain in right upper chest with palpation and left lower chest as well- no palpable abnormalities  Lungs: nonlabored, normal respiratory rate Abdomen: soft/nondistended. No pain in RUQ or epigastric area at present Ext: no edema Skin: warm, dry  Assessment/Plan:  Chest wall pain S:  patient concerned about costochondritis- worse with certain chest exercises at the gym- has been better since laying off A/P: suspect muscular strain for some of these areas but could be constochondritis. Nsaids should be avoided given ulcerative colitis. Prednisone - he feels poorly on. We opted for more conservative care per avs.   Family history of alpha 1 antitrypsin deficiency S: Patient has had intermittent issues over the years with allergic symptoms and  even shortness of breath. My prior notes mention normal PFTs some time ago but patient reflects and is not sure if he had full PFTs today. He states has trouble in cold and high humidity in particular. Recently had some feeling of heaviness in lungs with exercise intermittently (please note prior cardiac workup in 2015 with Dr. Debara Pickett including stress test which was reassuring for shortness of breath at that time.   In regards to possible liver issues from alpha-1 antitrypsin- Bili slightly high- has bene told gilbert's in past   From mychart messages "About a decade ago my father was diagnosed with Alpha-1 Antitrypsin Deficiency. As I understand it, he has one, not two genes for this, and the impact has been only to his lungs, not his liver. I hadn't given this much thought, but in the last couple of years I've had some sense of heaviness in my lungs when I exercise vigorously (like cardio or running). Now that I've been going to the gym for a year, I can say this happens frequently, but not all the time. It feels like I have moisture in my lungs and sometimes I cough a little phlegm. It could just be middle age, but my father wondered whether it might be Alpha-1. I'm guessing I should come in and we should talk about it. I'm waiting for my father to get specific info to me about whether he had the genotype or phenotype test or just an AAT count. A couple questions: Do you know if insurance would cover a test? Apparently a free test is available. Another question: What are the risks of testing? I'm already following some of the protocol: vigorous exercise. And I have the inhaler you prescribed, although I've only used it a couple times. What would change with a firm diagnosis? Or with ruling it out?  " A/P: We opted after long discussion today to get AAT test as well as AAT phenotype. We discussed benefits and risks- discussed also would refer to pulm if testing was positive for their opinion- possible x-ray  and PFTs  Update LFTs given family history and RUQ pain.   GERD S: upper abdominal symptoms. intermittend RUQ pain usually in the middle of the night or in the evening. Admits evening meal usually the largest.  Not having at the moment, not exertional He states symptoms worsened when stopped prilosec. Using prn zantac which helps some and symptoms not as persistent as they were before PPI A/P: Patient asks about going back to GI for endoscopy and ultrasound as prior discussed- I told him I thought a few week trial of scheduled zantac would be reasonable first step and to follow upw ith GI if not ipmroving  Lab/Order associations: Family history of alpha 1 antitrypsin deficiency -  Plan: Alpha-1-antitrypsin, Alpha-1 antitrypsin phenotype  RUQ pain - Plan: Comprehensive metabolic panel  Gastroesophageal reflux disease without esophagitis  Time Stamp The duration of face-to-face time during this visit was greater than 25 minutes. Greater than 50% of this time was spent in counseling, explanation of diagnosis, planning of further management, and/or coordination of care including discussion of alpha 1 testing, next steps for RUQ pain and why I thought linked to gerd, discussion of chest wall pain potential causes and reasons to avoid certain therapies in him  Return precautions advised.  [name], MD

## 2018-06-14 NOTE — Assessment & Plan Note (Addendum)
S: Patient has had intermittent issues over the years with allergic symptoms and even shortness of breath. My prior notes mention normal PFTs some time ago but patient reflects and is not sure if he had full PFTs today. He states has trouble in cold and high humidity in particular. Recently had some feeling of heaviness in lungs with exercise intermittently (please note prior cardiac workup in 2015 with Dr. Debara Pickett including stress test which was reassuring for shortness of breath at that time.   In regards to possible liver issues from alpha-1 antitrypsin- Bili slightly high- has bene told gilbert's in past   From mychart messages "About a decade ago my father was diagnosed with Alpha-1 Antitrypsin Deficiency. As I understand it, he has one, not two genes for this, and the impact has been only to his lungs, not his liver. I hadn't given this much thought, but in the last couple of years I've had some sense of heaviness in my lungs when I exercise vigorously (like cardio or running). Now that I've been going to the gym for a year, I can say this happens frequently, but not all the time. It feels like I have moisture in my lungs and sometimes I cough a little phlegm. It could just be middle age, but my father wondered whether it might be Alpha-1. I'm guessing I should come in and we should talk about it. I'm waiting for my father to get specific info to me about whether he had the genotype or phenotype test or just an AAT count. A couple questions: Do you know if insurance would cover a test? Apparently a free test is available. Another question: What are the risks of testing? I'm already following some of the protocol: vigorous exercise. And I have the inhaler you prescribed, although I've only used it a couple times. What would change with a firm diagnosis? Or with ruling it out?  " A/P: We opted after long discussion today to get AAT test as well as AAT phenotype. We discussed benefits and risks- discussed also  would refer to pulm if testing was positive for their opinion- possible x-ray and PFTs  Update LFTs given family history and RUQ pain.

## 2018-06-19 LAB — ALPHA-1 ANTITRYPSIN PHENOTYPE: A1 ANTITRYPSIN SER: 140 mg/dL (ref 83–199)

## 2018-06-22 ENCOUNTER — Encounter: Payer: Self-pay | Admitting: Family Medicine

## 2018-06-28 ENCOUNTER — Other Ambulatory Visit: Payer: Self-pay | Admitting: Family Medicine

## 2018-07-09 ENCOUNTER — Other Ambulatory Visit: Payer: Self-pay | Admitting: Family Medicine

## 2018-08-15 ENCOUNTER — Telehealth: Payer: Self-pay | Admitting: Family Medicine

## 2018-08-15 NOTE — Telephone Encounter (Signed)
Pt called to f/u and I scheduled him for 08/16/18 at 2:15pm with Dr. Yong Channel. 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

## 2018-08-15 NOTE — Telephone Encounter (Signed)
Please advise on seeing patient sooner  Copied from [place] [phone]. Topic: Appointment Scheduling - Scheduling Inquiry for Clinic >> Aug 15, 2018 11:20 AM Berneta Levins wrote: Reason for CRM:   Pt calling, states psychotherapist told pt to be seen by PCP to discuss medication for depression.  First available I see is 11/14 and pt states that is too far out.  Pt would like to know if Dr. Yong Channel would see him sooner. Pt can be reached at [phone]

## 2018-08-16 ENCOUNTER — Encounter: Payer: Self-pay | Admitting: Family Medicine

## 2018-08-16 ENCOUNTER — Ambulatory Visit: Payer: BC Managed Care – PPO | Admitting: Family Medicine

## 2018-08-16 VITALS — BP 121/62 | HR 78 | Temp 97.8°F | Ht 70.0 in | Wt 184.0 lb

## 2018-08-16 DIAGNOSIS — R4589 Other symptoms and signs involving emotional state: Secondary | ICD-10-CM

## 2018-08-16 DIAGNOSIS — F3342 Major depressive disorder, recurrent, in full remission: Secondary | ICD-10-CM | POA: Insufficient documentation

## 2018-08-16 DIAGNOSIS — F321 Major depressive disorder, single episode, moderate: Secondary | ICD-10-CM

## 2018-08-16 DIAGNOSIS — F339 Major depressive disorder, recurrent, unspecified: Secondary | ICD-10-CM | POA: Insufficient documentation

## 2018-08-16 DIAGNOSIS — F329 Major depressive disorder, single episode, unspecified: Secondary | ICD-10-CM

## 2018-08-16 MED ORDER — ESCITALOPRAM OXALATE 10 MG PO TABS: 10.0000 mg | ORAL_TABLET | Freq: Every day | ORAL | 1 refills | Status: DC

## 2018-08-16 NOTE — Patient Instructions (Signed)
Please stop by lab before you go  Taking the medicine as directed and not missing any doses is one of the best things you can do to treat your depression.  Here are some things to keep in mind:  1) Side effects (stomach upset, some increased anxiety) may happen before you notice a benefit.  These side effects typically go away over time. 2) Changes to your dose of medicine or a change in medication all together is sometimes necessary 3) Most people need to be on medication at least 6-12 months 4) Many people will notice an improvement within two weeks but the full effect of the medication can take up to 4-6 weeks 5) Stopping the medication when you start feeling better often results in a return of symptoms 6) If you start having thoughts of hurting yourself or others after starting this medicine, call our office immediately at [phone] or seek care through 911.    Since you wont really be available in country- can use mychart to reach [place] if we want to work on adjustments

## 2018-08-16 NOTE — Progress Notes (Signed)
Subjective:  [name] is a 57 y.o. year old very pleasant male patient who presents for/with See problem oriented charting ROS-does have some headache still.  No chest pain or shortness of breath reported.  Some sad mood.  Some anhedonia.  No SI.  Past Medical History-  Patient Active Problem List   Diagnosis Date Noted  . ULCERATIVE COLITIS-LEFT SIDE 05/06/2008    Priority: High  . 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 Hyperglycemia 01/26/2018    Priority: Medium  . History of adenomatous polyp of colon 05/22/2017    Priority: Medium  . 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 Hyperlipidemia 05/15/2007    Priority: Medium  . Migraines 05/03/2007    Priority: Medium  . Vasovagal syncope 02/14/2017    Priority: Low  . Hearing loss 08/04/2016    Priority: Low  . Low back pain 02/09/2016    Priority: Low  . DOE (dyspnea on exertion) 04/15/2014    Priority: Low  . 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 Vitamin D deficiency 10/27/2009    Priority: Low  . GERD 01/31/2008    Priority: Low  . INTERNAL HEMORRHOIDS 01/23/2008    Priority: Low  . Allergic rhinitis 05/03/2007    Priority: Low  . Depression, major, single episode, moderate ([place]) 08/16/2018  . Family history of alpha 1 antitrypsin deficiency 06/14/2018    Medications- reviewed and updated Current Outpatient Medications  Medication Sig Dispense Refill  . amitriptyline (ELAVIL) 10 MG tablet TAKE 1 TABLET AT BEDTIME 90 tablet 3  . balsalazide (COLAZAL) 750 MG capsule TAKE 3 CAPSULES (=2250MG) 3TIMES A DAY. (Patient taking differently: TAKE 3 CAPSULES (=2250MG) 2TIMES A DAY.) 810 capsule 1  . cetirizine (ZYRTEC) 10 MG tablet Take 10 mg by mouth daily.    . cyclobenzaprine (FLEXERIL) 10 MG tablet Take 1 tablet (10 mg total) by mouth 3 (three) times daily as needed for muscle spasms. 30 tablet 0  . diphenhydrAMINE (BENADRYL) 25 MG tablet Take 25 mg by  mouth at bedtime as needed.    . fluticasone (FLONASE) 50 MCG/ACT nasal spray USE 1 SPRAY IN EACH NOSTRILTWICE DAILY 48 g 3  . ibuprofen (ADVIL,MOTRIN) 200 MG tablet Take 200 mg by mouth as needed.      . Melatonin 1 MG TABS Take by mouth at bedtime.    . mesalamine (CANASA) 1000 MG suppository Place 1 suppository (1,000 mg total) rectally at bedtime. 90 suppository 0  . metoprolol tartrate (LOPRESSOR) 25 MG tablet TAKE 1 TABLET TWICE A DAY 180 tablet 3  . Naftifine HCl 2 % GEL Apply 1 application topically 2 (two) times daily. 60 g 2  . promethazine (PHENERGAN) 12.5 MG tablet Take 1 tablet (12.5 mg total) by mouth every 6 (six) hours as needed for nausea or vomiting. 30 tablet 0  . ranitidine (ZANTAC) 150 MG capsule Take 150 mg by mouth 2 (two) times daily.    . SUMAtriptan (IMITREX) 100 MG tablet TAKE 1 TABLET EVERY 2 HOURSAS NEEDED FOR MIGRAINE (2  TABLETS MAXIMUMPER DAY) 27 tablet 1  . escitalopram (LEXAPRO) 10 MG tablet Take 1 tablet (10 mg total) by mouth daily. 90 tablet 1   No current facility-administered medications for this visit.     Objective: BP 121/62 (BP Location: Right Arm, Patient Position: Sitting, Cuff Size: Large)   Pulse 78   Temp 97.8 F (36.6 C) (Oral)   Ht 5'  10" (1.778 m)   Wt 184 lb (83.5 kg)   SpO2 95%   BMI 26.40 kg/m  Gen: NAD, resting comfortably CV: RRR  Lungs: nonlabored, normal respiratory rate Abdomen: soft/nondistended Ext: no edema Skin: warm, dry Neuro: Speech normal, moves all extremities  Assessment/Plan:  Depression, major, single episode, moderate ([name]) S:[facility] since July. Toward end of September he would note some feelings of sadness in quiet moments. Could feel sadness across chest and behind eyes- was in the quiet moments. Talked with Dr. Magdalene Molly about it- was kind of in wait and watch pattern but he has noted some worsening symptoms. Continues to experience sadness, anhedonia, trouble with sleep,  tiredness, decreased appetite.   He wondered if it would be job related- had been on sabbatical but now not. Can be weepy at times. Notes he doesn't care about things and then feels sad about it.   Has had some anxiety for some time but has never had a non situational obvious depression in the past. Dr. Magdalene Molly suggested either continue to monitor or discuss with [name] about treating with medication.   No recent anxiety issues.  Depression screen PHQ 2/9 08/16/2018  Decreased Interest 1  Down, Depressed, Hopeless 3  PHQ - 2 Score 4  Altered sleeping 2  Tired, decreased energy 2  Change in appetite 1  Feeling bad or failure about yourself  1  Trouble concentrating 1  Moving slowly or fidgety/restless 1  Suicidal thoughts 0  PHQ-9 Score 12  Difficult doing work/chores Somewhat difficult  A/P: New onset depression- moderate severity -start lexapro 79m - he has read some studies suggesting wellbutrin not effective so he would prefer not to trial that as first line though SE profile may be better for him - rule out organic causes with labs  Future Appointments  Date Time Provider [place] 10/15/2018  1:20 PM HMarin Olp MD [facility]   Lab/Order associations: Depressed mood - Plan: CBC, Comprehensive metabolic panel, TSH  Depression, major, single episode, moderate ([name]  Meds ordered this encounter  Medications  . escitalopram (LEXAPRO) 10 MG tablet    Sig: Take 1 tablet (10 mg total) by mouth daily.    Dispense:  90 tablet    Refill:  1   Return precautions advised.  SGarret Reddish MD

## 2018-08-16 NOTE — Assessment & Plan Note (Signed)
S:[name] [name] since July. Toward end of September he would note some feelings of sadness in quiet moments. Could feel sadness across chest and behind eyes- was in the quiet moments. Talked with [name] about it- was kind of in wait and watch pattern but he has noted some worsening symptoms. Continues to experience sadness, anhedonia, trouble with sleep, tiredness, decreased appetite.   He wondered if it would be job related- had been on sabbatical but now not. Can be weepy at times. Notes he doesn't care about things and then feels sad about it.   Has had some anxiety for some time but has never had a non situational obvious depression in the past. [name] suggested either continue to monitor or discuss with [place] about treating with medication.   No recent anxiety issues.  Depression screen PHQ 2/9 08/16/2018  Decreased Interest 1  Down, Depressed, Hopeless 3  PHQ - 2 Score 4  Altered sleeping 2  Tired, decreased energy 2  Change in appetite 1  Feeling bad or failure about yourself  1  Trouble concentrating 1  Moving slowly or fidgety/restless 1  Suicidal thoughts 0  PHQ-9 Score 12  Difficult doing work/chores Somewhat difficult  A/P: New onset depression- moderate severity -start lexapro 44m - he has read some studies suggesting wellbutrin not effective so he would prefer not to trial that as first line though SE profile may be better for him - rule out organic causes with labs

## 2018-08-16 NOTE — Telephone Encounter (Signed)
Noted, pt currently in the office.

## 2018-08-17 LAB — COMPREHENSIVE METABOLIC PANEL
ALBUMIN: 4.8 g/dL (ref 3.5–5.2)
ALK PHOS: 31 U/L — AB (ref 39–117)
ALT: 10 U/L (ref 0–53)
AST: 13 U/L (ref 0–37)
BILIRUBIN TOTAL: 1.3 mg/dL — AB (ref 0.2–1.2)
BUN: 15 mg/dL (ref 6–23)
CO2: 32 mEq/L (ref 19–32)
CREATININE: 1.21 mg/dL (ref 0.40–1.50)
Calcium: 9.3 mg/dL (ref 8.4–10.5)
Chloride: 103 mEq/L (ref 96–112)
GFR: 65.97 mL/min (ref >60.00)
GLUCOSE: 78 mg/dL (ref 70–99)
Potassium: 4.1 mEq/L (ref 3.5–5.1)
SODIUM: 142 meq/L (ref 135–145)
TOTAL PROTEIN: 6.9 g/dL (ref 6.0–8.3)

## 2018-08-17 LAB — CBC
HCT: 41.2 % (ref 39.0–52.0)
HEMOGLOBIN: 14.6 g/dL (ref 13.0–17.0)
MCHC: 35.4 g/dL (ref 30.0–36.0)
MCV: 86.5 fl (ref 78.0–100.0)
PLATELETS: 210 10*3/uL (ref 150.0–400.0)
RBC: 4.76 Mil/uL (ref 4.22–5.81)
RDW: 12.9 % (ref 11.5–15.5)
WBC: 6.3 10*3/uL (ref 4.0–10.5)

## 2018-08-17 LAB — TSH: TSH: 0.54 u[IU]/mL (ref 0.35–4.50)

## 2018-09-27 ENCOUNTER — Encounter: Payer: BC Managed Care – PPO | Admitting: Family Medicine

## 2018-10-15 ENCOUNTER — Ambulatory Visit (INDEPENDENT_AMBULATORY_CARE_PROVIDER_SITE_OTHER): Payer: BC Managed Care – PPO | Admitting: Family Medicine

## 2018-10-15 ENCOUNTER — Encounter: Payer: Self-pay | Admitting: Family Medicine

## 2018-10-15 VITALS — BP 132/88 | HR 74 | Temp 97.6°F | Ht 70.0 in | Wt 187.6 lb

## 2018-10-15 DIAGNOSIS — E559 Vitamin D deficiency, unspecified: Secondary | ICD-10-CM | POA: Diagnosis not present

## 2018-10-15 DIAGNOSIS — E785 Hyperlipidemia, unspecified: Secondary | ICD-10-CM | POA: Diagnosis not present

## 2018-10-15 DIAGNOSIS — G43119 Migraine with aura, intractable, without status migrainosus: Secondary | ICD-10-CM | POA: Diagnosis not present

## 2018-10-15 DIAGNOSIS — Z23 Encounter for immunization: Secondary | ICD-10-CM

## 2018-10-15 DIAGNOSIS — F321 Major depressive disorder, single episode, moderate: Secondary | ICD-10-CM | POA: Diagnosis not present

## 2018-10-15 DIAGNOSIS — R739 Hyperglycemia, unspecified: Secondary | ICD-10-CM

## 2018-10-15 DIAGNOSIS — Z125 Encounter for screening for malignant neoplasm of prostate: Secondary | ICD-10-CM

## 2018-10-15 DIAGNOSIS — Z0000 Encounter for general adult medical examination without abnormal findings: Secondary | ICD-10-CM | POA: Diagnosis not present

## 2018-10-15 DIAGNOSIS — K5150 Left sided colitis without complications: Secondary | ICD-10-CM

## 2018-10-15 LAB — CBC
HCT: 43.1 % (ref 39.0–52.0)
Hemoglobin: 15.2 g/dL (ref 13.0–17.0)
MCHC: 35.2 g/dL (ref 30.0–36.0)
MCV: 85.8 fl (ref 78.0–100.0)
Platelets: 197 10*3/uL (ref 150.0–400.0)
RBC: 5.03 Mil/uL (ref 4.22–5.81)
RDW: 12.9 % (ref 11.5–15.5)
WBC: 6.9 10*3/uL (ref 4.0–10.5)

## 2018-10-15 LAB — COMPREHENSIVE METABOLIC PANEL
ALK PHOS: 38 U/L — AB (ref 39–117)
ALT: 10 U/L (ref 0–53)
AST: 15 U/L (ref 0–37)
Albumin: 4.8 g/dL (ref 3.5–5.2)
BUN: 20 mg/dL (ref 6–23)
CO2: 32 mEq/L (ref 19–32)
Calcium: 9.9 mg/dL (ref 8.4–10.5)
Chloride: 99 mEq/L (ref 96–112)
Creatinine, Ser: 1.2 mg/dL (ref 0.40–1.50)
GFR: 66.57 mL/min (ref >60.00)
Glucose, Bld: 87 mg/dL (ref 70–99)
Potassium: 4.4 mEq/L (ref 3.5–5.1)
Sodium: 138 mEq/L (ref 135–145)
Total Bilirubin: 1.3 mg/dL — ABNORMAL HIGH (ref 0.2–1.2)
Total Protein: 7 g/dL (ref 6.0–8.3)

## 2018-10-15 LAB — PSA: PSA: 0.39 ng/mL (ref 0.10–4.00)

## 2018-10-15 LAB — LIPID PANEL
Cholesterol: 238 mg/dL — ABNORMAL HIGH (ref 0–200)
HDL: 65 mg/dL (ref >39.00)
NONHDL: 173.17
Total CHOL/HDL Ratio: 4
Triglycerides: 224 mg/dL — ABNORMAL HIGH (ref 0.0–149.0)
VLDL: 44.8 mg/dL — ABNORMAL HIGH (ref 0.0–40.0)

## 2018-10-15 LAB — VITAMIN D 25 HYDROXY (VIT D DEFICIENCY, FRACTURES): VITD: 40.44 ng/mL (ref 30.00–100.00)

## 2018-10-15 LAB — LDL CHOLESTEROL, DIRECT: Direct LDL: 148 mg/dL

## 2018-10-15 NOTE — Assessment & Plan Note (Signed)
Migraines-remains on amitriptyline 10 mg metoprolol 25 mg twice a day.  Very rare oxycodone-#5 lasst over a year. Headaches have been slightly worse overall.  - he asks about injectable options- we will refer to neurology to discuss

## 2018-10-15 NOTE — Assessment & Plan Note (Signed)
Hyperlipidemia- update lipids today.  He has used krill oil in past- now off.  Evaluate ten-year risk of heart attack or stroke.

## 2018-10-15 NOTE — Patient Instructions (Addendum)
We will call you within two weeks about your referral to neurology. If you do not hear within 3 weeks, give [place] a call.   schedule depression follow-up with me in perhaps 5 months- we can do your repeat Shingrix at that time  Vitamin D dose currently?  Krill oil currently?   Please stop by lab before you go

## 2018-10-15 NOTE — Assessment & Plan Note (Signed)
Continues to follow-up with GI for ulcerative colitis-on balsalazide and Canasa suppositories.  Mild crampy abdominal pain while in [place]- did a course of traveler diarrhea medication with cipro x 3 days- took perhaps 2-3 days after completion to help. If had recurrence could do ova/parasite/etc testing- patient worried about giardia

## 2018-10-15 NOTE — Assessment & Plan Note (Addendum)
Patient diagnosed with new onset depression in November 2019-he was started on Lexapro 10 mg.  We had considered Wellbutrin but he had read some studies about it not being as effective though the side effect profile would be preferable. PHQ9 controlled with score under 5. States may have brief sadness spells but not long or pervasive  And actually seem to have a reason . No difficulty achieving erection but taking longer for ejaculation.   Sleep disturbance 2-3x a week- remains on amitriptyline at low dose.  We wonder if adding Lexapro has worsened this-at 6 months may consider reducing or stopping Lexapro if depression remains controlled

## 2018-10-15 NOTE — Assessment & Plan Note (Signed)
Vitamin D deficiency-low in the past.  Currently taking 2k units per day.  We will update vitamin D

## 2018-10-15 NOTE — Progress Notes (Signed)
Phone: [phone]  Subjective:  Patient presents today for their annual physical. Chief complaint-noted.   See problem oriented charting- ROS- full  review of systems was completed and negative except for: Hearing loss-wears hearing aids, postnasal drip, tinnitus, left foot swelling, diarrhea now resolved, cold intolerance, muscle aches, mild erythema on nose, seasonal allergies, food allergies, headaches, sleep disturbance  The following were reviewed and entered/updated in epic: Past Medical History:  Diagnosis Date  . Allergic rhinitis   . Allergy   . Arthritis   . GERD (gastroesophageal reflux disease)   . Gilbert syndrome   . Heart murmur    tricuspid valve regurgitation  . Internal hemorrhoids   . Migraines   . Panic attack    occasional  . Syncope    on airplane in 12/2016/ due to stress  . 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 Depression, major, single episode, moderate ([place]) 08/16/2018    Priority: High  . ULCERATIVE COLITIS-LEFT SIDE.  Follows with Dr. Fuller [facility] 05/06/2008    Priority: High  . 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 Hyperglycemia 01/26/2018    Priority: Medium  . History of adenomatous polyp of colon 05/22/2017    Priority: Medium  . 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 Hyperlipidemia 05/15/2007    Priority: Medium  . Migraines 05/03/2007    Priority: Medium  . Vasovagal syncope 02/14/2017    Priority: Low  . Hearing loss 08/04/2016    Priority: Low  . Low back pain 02/09/2016    Priority: Low  . DOE (dyspnea on exertion) 04/15/2014    Priority: Low  . 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 Vitamin D deficiency 10/27/2009    Priority: Low  . GERD 01/31/2008    Priority: Low  . INTERNAL HEMORRHOIDS 01/23/2008    Priority: Low  . Allergic rhinitis 05/03/2007    Priority: Low  . Family history of  alpha 1 antitrypsin deficiency 06/14/2018   Past Surgical History:  Procedure Laterality Date  . ADENOIDECTOMY     as a child  . COLONOSCOPY    . 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 UPPER GASTROINTESTINAL ENDOSCOPY      Family History  Problem Relation Age of Onset  . Thyroid disease Father   . Heart disease Father        a fib, expanded aorta  . Arthritis Mother   . Colon cancer Unknown        Grandmother's Family (x 1 brother to grandmother and 1 sister to grandmother)  . Crohn's disease Paternal Aunt   . Crohn's disease Cousin   . Colon polyps Maternal Grandmother        Great Aunt, Great Uncle  . Heart disease Brother   . Colon polyps Brother   . Esophageal cancer Neg Hx   . Stomach cancer Neg Hx   . Rectal cancer Neg Hx     Medications- reviewed and updated Current Outpatient Medications  Medication Sig Dispense Refill  . amitriptyline (ELAVIL) 10 MG tablet TAKE 1 TABLET AT BEDTIME 90 tablet 3  . balsalazide (COLAZAL) 750 MG capsule TAKE 3 CAPSULES (=2250MG) 3TIMES A DAY. (Patient taking differently: TAKE 3 CAPSULES (=2250MG) 2TIMES A DAY.) 810 capsule 1  . cetirizine (ZYRTEC) 10 MG tablet Take 10 mg by mouth daily.    Marland Kitchen  cyclobenzaprine (FLEXERIL) 10 MG tablet Take 1 tablet (10 mg total) by mouth 3 (three) times daily as needed for muscle spasms. 30 tablet 0  . diphenhydrAMINE (BENADRYL) 25 MG tablet Take 25 mg by mouth at bedtime as needed.    Marland Kitchen escitalopram (LEXAPRO) 10 MG tablet Take 1 tablet (10 mg total) by mouth daily. 90 tablet 1  . fluticasone (FLONASE) 50 MCG/ACT nasal spray USE 1 SPRAY IN EACH NOSTRILTWICE DAILY 48 g 3  . ibuprofen (ADVIL,MOTRIN) 200 MG tablet Take 200 mg by mouth as needed.      . Melatonin 1 MG TABS Take by mouth at bedtime.    . mesalamine (CANASA) 1000 MG suppository Place 1 suppository (1,000 mg total) rectally at bedtime. 90 suppository 0  . metoprolol tartrate (LOPRESSOR) 25 MG tablet TAKE 1 TABLET TWICE A DAY 180  tablet 3  . Naftifine HCl 2 % GEL Apply 1 application topically 2 (two) times daily. 60 g 2  . promethazine (PHENERGAN) 12.5 MG tablet Take 1 tablet (12.5 mg total) by mouth every 6 (six) hours as needed for nausea or vomiting. 30 tablet 0  . SUMAtriptan (IMITREX) 100 MG tablet TAKE 1 TABLET EVERY 2 HOURSAS NEEDED FOR MIGRAINE (2  TABLETS MAXIMUMPER DAY) 27 tablet 1  . Vitamin D, Cholecalciferol, 50 MCG (2000 UT) CAPS Take 1 capsule by mouth daily.     No current facility-administered medications for this visit.     Allergies-reviewed and updated Allergies  Allergen Reactions  . Shellfish Allergy Nausea And Vomiting    scallops  . Citrus     Oral pain, rash in mouth  . Codeine Nausea Only  . Dani Gobble [Fish Allergy] Nausea Only and Rash    Especially smoked salmon    Social History   Social History Narrative   Lives with husband Dwaine Deter also a patient of Dr. Yong Channel). No pets or adopted children or natural children.       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       Hobbies: walking, hiking, reading, music, cooking , gardening.     Objective: BP 132/88 (BP Location: Left Arm, Patient Position: Sitting, Cuff Size: Large)   Pulse 74   Temp 97.6 F (36.4 C) (Oral)   Ht 5' 10"  (1.778 m)   Wt 187 lb 9.6 oz (85.1 kg)   SpO2 98%   BMI 26.92 kg/m  Gen: NAD, resting comfortably HEENT: Mucous membranes are moist. Oropharynx normal Neck: no thyromegaly CV: RRR no murmurs rubs or gallops Lungs: CTAB no crackles, wheeze, rhonchi Abdomen: soft/nontender/nondistended/normal bowel sounds. No rebound or guarding.  Ext: no edema pretibially- on left forefoot mild pitting edema-mild pain at MTP joint left second toe Skin: warm, dry Neuro: grossly normal, moves all extremities, PERRLA  Assessment/Plan:  58 y.o. male presenting for annual physical.  Health Maintenance counseling: 1. Anticipatory guidance: Patient counseled regarding regular dental exams -q6  months, eye exams -yearly,  avoiding smoking and second hand smoke, limiting alcohol to 2 beverages per day.   2. Risk factor reduction:  Advised patient of need for regular exercise and diet rich and fruits and vegetables to reduce risk of heart attack and stroke. Exercise-walking 20 to 25 minutes twice a day last year along with the gym 2-3 times a week with a trainer-has kept that up!  Diet-has lost weight last year but now back up to his baseline around 187- had gotten up to 179 on home scales, now 183 after travel  to [place].  Wt Readings from Last 3 Encounters:  10/15/18 187 lb 9.6 oz (85.1 kg)  08/16/18 184 lb (83.5 kg)  06/14/18 184 lb 6.4 oz (83.6 kg)  3. Immunizations/screenings/ancillary studies- shingrix started today  Immunization History  Administered Date(s) Administered  . Influenza Whole 07/31/2009, 06/16/2010  . Influenza,inj,Quad PF,6+ Mos 06/30/2015  . Influenza-Unspecified 07/21/2016, 07/10/2017, 07/17/2018  . Td 05/09/2006  . Tdap 11/01/2013  . Zoster Recombinat (Shingrix) 10/15/2018  4. Prostate cancer screening- low risk PSA trend in the past.  Will update rectal exam only if PSA trend concerning. Stable nocturia once a night Lab Results  Component Value Date   PSA 0.39 10/15/2018   PSA 0.33 08/17/2017   PSA 0.31 07/26/2016   5. Colon cancer screening - 05/09/2017 with ulcerative colitis and polyp-plan for every 3 year repeat  6. Skin cancer screening- visit in April with Dr. Renda Rolls coming up. advised regular sunscreen use. Denies worrisome, changing, or new skin lesions.  7. Never smoker  Status of chronic or acute concerns   Had some ankle pain/foot with travel. No calf pain. Mild swelling in top of foot.  May have had a short-term sprain/injury-patient states symptoms are improving-minimal pain and edema is improving so we will monitor only  Depression, major, single episode, moderate ([place]) Patient diagnosed with new onset depression in November 2019-he was  started on Lexapro 10 mg.  We had considered Wellbutrin but he had read some studies about it not being as effective though the side effect profile would be preferable. PHQ9 controlled with score under 5. States may have brief sadness spells but not long or pervasive  And actually seem to have a reason . No difficulty achieving erection but taking longer for ejaculation.   Sleep disturbance 2-3x a week- remains on amitriptyline at low dose.  We wonder if adding Lexapro has worsened this-at 6 months may consider reducing or stopping Lexapro if depression remains controlled  ULCERATIVE COLITIS-LEFT SIDE.  Follows with Dr. Fuller [facility] Continues to follow-up with GI for ulcerative colitis-on balsalazide and Canasa suppositories.  Mild crampy abdominal pain while in [place]- did a course of traveler diarrhea medication with cipro x 3 days- took perhaps 2-3 days after completion to help. If had recurrence could do ova/parasite/etc testing- patient worried about giardia  Vitamin D deficiency Vitamin D deficiency-low in the past.  Currently taking 2k units per day.  We will update vitamin D  Hyperlipidemia Hyperlipidemia- update lipids today.  He has used krill oil in past- now off.  Evaluate ten-year risk of heart attack or stroke.  Migraines Migraines-remains on amitriptyline 10 mg metoprolol 25 mg twice a day.  Very rare oxycodone-#5 lasst over a year. Headaches have been slightly worse overall.  - he asks about injectable options- we will refer to neurology to discuss  5 month depression follow up  Lab/Order associations:NOT fasting  Preventative health care - Plan: VITAMIN D 25 Hydroxy (Vit-D Deficiency, Fractures), CBC, Lipid panel, Comprehensive metabolic panel, PSA  Depression, major, single episode, moderate ([facility])  Intractable migraine with aura without status migrainosus - Plan: Ambulatory referral to Neurology  Need for prophylactic vaccination and inoculation against varicella -  Plan: Varicella-zoster vaccine IM  Hyperlipidemia, unspecified hyperlipidemia type - Plan: CBC, Lipid panel, Comprehensive metabolic panel  Screening for prostate cancer - Plan: PSA  Vitamin D deficiency - Plan: VITAMIN D 25 Hydroxy (Vit-D Deficiency, Fractures)  ULCERATIVE COLITIS-LEFT SIDE.  Follows with Dr. Fuller [facility]  Hyperglycemia  Return precautions advised.  [name], MD

## 2018-10-27 ENCOUNTER — Other Ambulatory Visit: Payer: Self-pay | Admitting: Family Medicine

## 2018-10-29 NOTE — Telephone Encounter (Signed)
Last OV 10/15/2018 Last refill 06/04/18 #27/1 Next OV 03/12/2019

## 2018-11-03 ENCOUNTER — Encounter: Payer: Self-pay | Admitting: Family Medicine

## 2018-11-05 MED ORDER — CIPROFLOXACIN HCL 500 MG PO TABS: 500.0000 mg | ORAL_TABLET | Freq: Two times a day (BID) | ORAL | 0 refills | Status: AC

## 2018-11-05 NOTE — Telephone Encounter (Signed)
Okay for Cipro? Please advise

## 2018-12-11 NOTE — Progress Notes (Signed)
NEUROLOGY CONSULTATION NOTE  [name] MRN: [id number] DOB: Sep 10, 1961  Referring provider: [name], MD Primary care provider: [name], MD  Reason for consult: Headaches  HISTORY OF PRESENT ILLNESS: [name] is a 58 year old Caucasian man with depression, anxiety and Gilbert syndrome who presents for headaches.  History supplemented by referring provider note.  Onset:  Childhood and then since late 20s Location:  Unilateral (either side)  left behind eye radiating to back of head Quality:  pressure Intensity:  7/10.  He denies new headache, thunderclap headache or severe headache that wakes her from sleep. Aura:  On two occasions had jagged lines in vision Premonitory Phase:  Mild numb sensation, flushing of ear on side of headache Postdrome:  A day he has a fatigue sensation Associated symptoms:  Photophobia, phonophobia, osmophobia, nausea.  He denies associated unilateral numbness or weakness. Duration:  5-6 hours (if takes sumatriptan immediately, then 1 hour) Frequency:  10 to 15 days Frequency of abortive medication: at least 10 days a month Triggers:  Some strong smells, maybe chocolate or red wine, often after a stressful event. Relieving factors:  sumatriptan Activity:  aggravates  Current NSAIDS:  none Current analgesics:  Sumatriptan 34m Current triptans:  none Current ergotamine:  none Current anti-emetic: Promethazine 12.5 mg Current muscle relaxants: Cyclobenzaprine Current anti-anxiolytic:  none Current sleep aide:  none Current Antihypertensive medications: Metoprolol 25 mg twice daily Current Antidepressant medications: Amitriptyline 10 mg at bedtime, Lexapro 10 mg Current Anticonvulsant medications:  none Current anti-CGRP:  none Current Vitamins/Herbal/Supplements: Vitamin D Current Antihistamines/Decongestants: Benadryl, Zyrtec Other therapy:  none Other medications:  none  Past NSAIDS:  Ibuprofen, naproxen Past analgesics:   Tylenol Past abortive triptans: rizatriptan, eletriptan? Past abortive ergotamine:  none Past muscle relaxants:  none Past anti-emetic:  none Past antihypertensive medications:  none Past antidepressant medications:  doxepin Past anticonvulsant medications:  none Past anti-CGRP:  none Past vitamins/Herbal/Supplements:  none Past antihistamines/decongestants:  none Other past therapies:  none  Caffeine:  1 mug of coffee daily  Diet:  Drinks water Exercise:  yes Depression:  yes; Anxiety: yes Other pain:  Muscle aches Sleep hygiene:  Poor. Family history of headache:  Father, niece   10/15/2018 labs: CBC with WBC 6.9, Hgb 15.2, HCT 43.1, PLT 197; CMP with NA 138, K4.4, CL 99, CO2 32, glucose 87, BUN 20, CR 1.20, T bili 1.3, ALP 38, AST 15, ALT 10.  PAST MEDICAL HISTORY: Past Medical History:  Diagnosis Date  . Allergic rhinitis   . Allergy   . Arthritis   . GERD (gastroesophageal reflux disease)   . Gilbert syndrome   . Heart murmur    tricuspid valve regurgitation  . Internal hemorrhoids   . Migraines   . Panic attack    occasional  . Syncope    on airplane in 12/2016/ due to stress  . Ulcerative proctosigmoiditis ([place] 2001    PAST SURGICAL HISTORY: Past Surgical History:  Procedure Laterality Date  . ADENOIDECTOMY     as a child  . COLONOSCOPY    . 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 UPPER GASTROINTESTINAL ENDOSCOPY      MEDICATIONS: Current Outpatient Medications on File Prior to Visit  Medication Sig Dispense Refill  . amitriptyline (ELAVIL) 10 MG tablet TAKE 1 TABLET AT BEDTIME 90 tablet 3  . balsalazide (COLAZAL) 750 MG capsule TAKE 3 CAPSULES (=2250MG) 3TIMES A DAY. (Patient taking differently: TAKE 3 CAPSULES (=2250MG) 2TIMES A DAY.) 810  capsule 1  . cetirizine (ZYRTEC) 10 MG tablet Take 10 mg by mouth daily.    . cyclobenzaprine (FLEXERIL) 10 MG tablet Take 1 tablet (10 mg total) by mouth 3 (three) times daily as needed for muscle  spasms. 30 tablet 0  . diphenhydrAMINE (BENADRYL) 25 MG tablet Take 25 mg by mouth at bedtime as needed.    Marland Kitchen escitalopram (LEXAPRO) 10 MG tablet Take 1 tablet (10 mg total) by mouth daily. 90 tablet 1  . fluticasone (FLONASE) 50 MCG/ACT nasal spray USE 1 SPRAY IN EACH NOSTRILTWICE DAILY 48 g 3  . ibuprofen (ADVIL,MOTRIN) 200 MG tablet Take 200 mg by mouth as needed.      . Melatonin 1 MG TABS Take by mouth at bedtime.    . mesalamine (CANASA) 1000 MG suppository Place 1 suppository (1,000 mg total) rectally at bedtime. 90 suppository 0  . metoprolol tartrate (LOPRESSOR) 25 MG tablet TAKE 1 TABLET TWICE A DAY 180 tablet 3  . Naftifine HCl 2 % GEL Apply 1 application topically 2 (two) times daily. 60 g 2  . promethazine (PHENERGAN) 12.5 MG tablet Take 1 tablet (12.5 mg total) by mouth every 6 (six) hours as needed for nausea or vomiting. 30 tablet 0  . SUMAtriptan (IMITREX) 100 MG tablet TAKE 1 TABLET EVERY 2 HOURSAS NEEDED FOR MIGRAINE (2  TABLETS MAXIMUM PER DAY) 27 tablet 1  . Vitamin D, Cholecalciferol, 50 MCG (2000 UT) CAPS Take 1 capsule by mouth daily.    . [DISCONTINUED] glycopyrrolate (ROBINUL) 2 MG tablet Take 2 mg by mouth 2 (two) times daily.       No current facility-administered medications on file prior to visit.     ALLERGIES: Allergies  Allergen Reactions  . Shellfish Allergy Nausea And Vomiting    scallops  . Citrus     Oral pain, rash in mouth  . Codeine Nausea Only  . Salmon [Fish Allergy] Nausea Only and Rash    Especially smoked salmon    FAMILY HISTORY: Family History  Problem Relation Age of Onset  . Thyroid disease Father   . Heart disease Father        a fib, expanded aorta  . Arthritis Mother   . Colon cancer Unknown        Grandmother's Family (x 1 brother to grandmother and 1 sister to grandmother)  . Crohn's disease Paternal Aunt   . Crohn's disease Cousin   . Colon polyps Maternal Grandmother        Great Aunt, Great Uncle  . Heart disease  Brother   . Colon polyps Brother   . Esophageal cancer Neg Hx   . Stomach cancer Neg Hx   . Rectal cancer Neg Hx    SOCIAL HISTORY: Social History   Socioeconomic History  . Marital status: Married    Spouse name: Domestic partner  . Number of children: Not on file  . Years of education: Not on file  . Highest education level: Not on file  Occupational History  . Occupation: Teacher, adult education: [employer]  Social Needs  . Financial resource strain: Not on file  . Food insecurity:    Worry: Not on file    Inability: Not on file  . Transportation needs:    Medical: Not on file    Non-medical: Not on file  Tobacco Use  . Smoking status: Never Smoker  . Smokeless tobacco: Never Used  Substance and Sexual Activity  . Alcohol use: Yes  Comment: 2 a day/14 a week  . Drug use: No  . Sexual activity: Not on file  Lifestyle  . Physical activity:    Days per week: Not on file    Minutes per session: Not on file  . Stress: Not on file  Relationships  . Social connections:    Talks on phone: Not on file    Gets together: Not on file    Attends religious service: Not on file    Active member of club or organization: Not on file    Attends meetings of clubs or organizations: Not on file    Relationship status: Not on file  . Intimate partner violence:    Fear of current or ex partner: Not on file    Emotionally abused: Not on file    Physically abused: Not on file    Forced sexual activity: Not on file  Other Topics Concern  . Not on file  Social History Narrative   Lives with husband [name] also a patient of Dr. Yong Channel). No pets or adopted children or natural children.       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       Hobbies: walking, hiking, reading, music, cooking , gardening.     REVIEW OF SYSTEMS: Constitutional: No fevers, chills, or sweats, no generalized fatigue, change in appetite Eyes: No visual changes, double vision,  eye pain Ear, nose and throat: No hearing loss, ear pain, nasal congestion, sore throat Cardiovascular: No chest pain, palpitations Respiratory:  No shortness of breath at rest or with exertion, wheezes GastrointestinaI: No nausea, vomiting, diarrhea, abdominal pain, fecal incontinence Genitourinary:  No dysuria, urinary retention or frequency Musculoskeletal:  No neck pain, back pain Integumentary: No rash, pruritus, skin lesions Neurological: as above Psychiatric: No depression, insomnia, anxiety Endocrine: No palpitations, fatigue, diaphoresis, mood swings, change in appetite, change in weight, increased thirst Hematologic/Lymphatic:  No purpura, petechiae. Allergic/Immunologic: no itchy/runny eyes, nasal congestion, recent allergic reactions, rashes  PHYSICAL EXAM: Blood pressure 120/80, pulse 68, height 5' 10"  (1.778 m), weight 187 lb (84.8 kg), SpO2 98 %. General: No acute distress.  Patient appears well-groomed.   Head:  Normocephalic/atraumatic Eyes:  fundi examined but not visualized Neck: supple, no paraspinal tenderness, full range of motion Back: No paraspinal tenderness Heart: regular rate and rhythm Lungs: Clear to auscultation bilaterally. Vascular: No carotid bruits. Neurological Exam: Mental status: alert and oriented to person, place, and time, recent and remote memory intact, fund of knowledge intact, attention and concentration intact, speech fluent and not dysarthric, language intact. Cranial nerves: CN I: not tested CN II: pupils equal, round and reactive to light, visual fields intact CN III, IV, VI:  full range of motion, no nystagmus, no ptosis CN V: facial sensation intact CN VII: upper and lower face symmetric CN VIII: hearing intact CN IX, X: gag intact, uvula midline CN XI: sternocleidomastoid and trapezius muscles intact CN XII: tongue midline Bulk & Tone: normal, no fasciculations. Motor:  5/5 throughout  Sensation: temperature and vibration  sensation intact.  Deep Tendon Reflexes:  2+ throughout, toes downgoing.   Finger to nose testing:  Without dysmetria.   Heel to shin:  Without dysmetria.   Gait:  Normal station and stride. Romberg negative.  IMPRESSION: Chronic migraine without aura, without status migrainosus, not intractable  PLAN: 1.  For preventative management, start Emgality.  Continue amitriptyline and metoprolol for now 2.  For abortive therapy, sumatriptan (promethazine for nausea if needed)  3.  Limit use of pain relievers to no more than 2 days out of week to prevent risk of rebound or medication-overuse headache. 4.  Keep headache diary 5.  Exercise, hydration, caffeine cessation, sleep hygiene, monitor for and avoid triggers 6.  Consider:  magnesium citrate 454m daily, riboflavin 4029mdaily, and coenzyme Q10 10051mhree times daily 7.  Follow up in 4 months  Thank you for allowing me to take part in the care of this patient.  AdaMetta ClinesO  CC: SteGarret ReddishD

## 2018-12-13 ENCOUNTER — Encounter: Payer: Self-pay | Admitting: Neurology

## 2018-12-13 ENCOUNTER — Encounter

## 2018-12-13 ENCOUNTER — Ambulatory Visit: Payer: BC Managed Care – PPO | Admitting: Neurology

## 2018-12-13 VITALS — BP 120/80 | HR 68 | Ht 70.0 in | Wt 187.0 lb

## 2018-12-13 DIAGNOSIS — G43709 Chronic migraine without aura, not intractable, without status migrainosus: Secondary | ICD-10-CM | POA: Diagnosis not present

## 2018-12-13 NOTE — Patient Instructions (Signed)
Migraine Recommendations: 1.  Start Emgality once monthly injection.  Continue other medications for now (metoprolol and amitriptyline) 2.  Take sumatriptan at earliest onset of headache.   3.  Limit use of pain relievers to no more than 2 days out of the week.  These medications include acetaminophen, ibuprofen, triptans and narcotics.  This will help reduce risk of rebound headaches. 4.  Be aware of common food triggers such as processed sweets, processed foods with nitrites (such as deli meat, hot dogs, sausages), foods with MSG, alcohol (such as wine), chocolate, certain cheeses, certain fruits (dried fruits, bananas, pineapple), vinegar, diet soda. 4.  Avoid caffeine 5.  Routine exercise 6.  Proper sleep hygiene 7.  Stay adequately hydrated with water 8.  Keep a headache diary. 9.  Maintain proper stress management. 10.  Do not skip meals. 11.  Consider supplements:  Magnesium citrate 457m to 6029mdaily, riboflavin 40023mCoenzyme Q 10 100m60mree times daily

## 2018-12-14 MED ORDER — GALCANEZUMAB-GNLM 120 MG/ML ~~LOC~~ SOSY: 1.0000 "pen " | PREFILLED_SYRINGE | SUBCUTANEOUS | 0 refills | Status: DC

## 2018-12-14 NOTE — Addendum Note (Signed)
Addended by: Chester Holstein on: 12/14/2018 01:42 PM   Modules accepted: Orders

## 2019-01-12 ENCOUNTER — Other Ambulatory Visit: Payer: Self-pay

## 2019-01-14 MED ORDER — GALCANEZUMAB-GNLM 120 MG/ML ~~LOC~~ SOSY: 1.0000 "pen " | PREFILLED_SYRINGE | SUBCUTANEOUS | 5 refills | Status: DC

## 2019-01-20 ENCOUNTER — Other Ambulatory Visit: Payer: Self-pay | Admitting: Family Medicine

## 2019-01-24 ENCOUNTER — Other Ambulatory Visit: Payer: Self-pay | Admitting: *Deleted

## 2019-01-24 MED ORDER — GALCANEZUMAB-GNLM 120 MG/ML ~~LOC~~ SOSY: 1.0000 "pen " | PREFILLED_SYRINGE | SUBCUTANEOUS | 5 refills | Status: DC

## 2019-01-28 ENCOUNTER — Ambulatory Visit (INDEPENDENT_AMBULATORY_CARE_PROVIDER_SITE_OTHER): Payer: BC Managed Care – PPO | Admitting: Family Medicine

## 2019-01-28 ENCOUNTER — Encounter: Payer: Self-pay | Admitting: Family Medicine

## 2019-01-28 VITALS — BP 133/100 | HR 60 | Temp 97.1°F

## 2019-01-28 DIAGNOSIS — J029 Acute pharyngitis, unspecified: Secondary | ICD-10-CM

## 2019-01-28 DIAGNOSIS — R030 Elevated blood-pressure reading, without diagnosis of hypertension: Secondary | ICD-10-CM | POA: Diagnosis not present

## 2019-01-28 MED ORDER — AMOXICILLIN-POT CLAVULANATE 875-125 MG PO TABS: 1.0000 | ORAL_TABLET | Freq: Two times a day (BID) | ORAL | 0 refills | Status: AC

## 2019-01-28 NOTE — Progress Notes (Addendum)
Phone [phone]   Subjective:  Virtual visit via Video note. Chief complaint: Chief Complaint  Patient presents with  . Sore Throat    Left ear pain,white coat in back of throat x 16 days    This visit type was conducted due to national recommendations for restrictions regarding the COVID-19 Pandemic (e.g. social distancing).  This format is felt to be most appropriate for this patient at this time balancing risks to patient and risks to population by having him in for in person visit.  No physical exam was performed (except for noted visual exam or audio findings with Telehealth visits).    Our team/I connected with [name] on 01/28/19 at 11:20 AM EDT by a video enabled telemedicine application ([email or web address]) and verified that I am speaking with the correct person using two identifiers.  Location patient: [place] Location provider: [facility], office Persons participating in the virtual visit:  patient  Our team/I discussed the limitations of evaluation and management by telemedicine and the availability of in person appointments. In light of current covid-19 pandemic, patient also understands that we are trying to protect them by minimizing in office contact if at all possible.  The patient expressed consent for telemedicine visit and agreed to proceed. Patient understands insurance will be billed.   ROS-  No fever, chills. Some sneezing. Has sore throat. Not much cough.    Past Medical History-  Patient Active Problem List   Diagnosis Date Noted  . Depression, major, single episode, moderate ([place]) 08/16/2018    Priority: High  . ULCERATIVE COLITIS-LEFT SIDE.  Follows with Dr. Fuller [facility] 05/06/2008    Priority: High  . 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 Hyperglycemia 01/26/2018    Priority: Medium  . History of adenomatous polyp of colon 05/22/2017    Priority: Medium  . 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 Hyperlipidemia 05/15/2007     Priority: Medium  . Migraines 05/03/2007    Priority: Medium  . Vasovagal syncope 02/14/2017    Priority: Low  . Hearing loss 08/04/2016    Priority: Low  . Low back pain 02/09/2016    Priority: Low  . DOE (dyspnea on exertion) 04/15/2014    Priority: Low  . 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 Vitamin D deficiency 10/27/2009    Priority: Low  . GERD 01/31/2008    Priority: Low  . INTERNAL HEMORRHOIDS 01/23/2008    Priority: Low  . Allergic rhinitis 05/03/2007    Priority: Low  . Family history of alpha 1 antitrypsin deficiency 06/14/2018    Medications- reviewed and updated Current Outpatient Medications  Medication Sig Dispense Refill  . amitriptyline (ELAVIL) 10 MG tablet TAKE 1 TABLET AT BEDTIME 90 tablet 3  . balsalazide (COLAZAL) 750 MG capsule TAKE 3 CAPSULES (=2250MG) 3TIMES A DAY. (Patient taking differently: TAKE 3 CAPSULES (=2250MG) 2TIMES A DAY.) 810 capsule 1  . cetirizine (ZYRTEC) 10 MG tablet Take 10 mg by mouth daily.    . cyclobenzaprine (FLEXERIL) 10 MG tablet Take 1 tablet (10 mg total) by mouth 3 (three) times daily as needed for muscle spasms. 30 tablet 0  . diphenhydrAMINE (BENADRYL) 25 MG tablet Take 25 mg by mouth at bedtime as needed.    Marland Kitchen escitalopram (LEXAPRO) 10 MG tablet TAKE 1 TABLET BY MOUTH EVERY DAY 90 tablet 1  . fluticasone (FLONASE) 50 MCG/ACT nasal spray USE 1 SPRAY IN  EACH NOSTRILTWICE DAILY 48 g 3  . Galcanezumab-gnlm (EMGALITY) 120 MG/ML SOSY Inject 1 pen into the skin every 30 (thirty) days. 1 Syringe 5  . ibuprofen (ADVIL,MOTRIN) 200 MG tablet Take 200 mg by mouth as needed.      . Melatonin 1 MG TABS Take by mouth at bedtime.    . mesalamine (CANASA) 1000 MG suppository Place 1 suppository (1,000 mg total) rectally at bedtime. 90 suppository 0  . metoprolol tartrate (LOPRESSOR) 25 MG tablet TAKE 1 TABLET TWICE A DAY 180 tablet 3  . Naftifine HCl 2 % GEL Apply 1 application topically 2 (two)  times daily. 60 g 2  . SUMAtriptan (IMITREX) 100 MG tablet TAKE 1 TABLET EVERY 2 HOURSAS NEEDED FOR MIGRAINE (2  TABLETS MAXIMUM PER DAY) 27 tablet 1  . Vitamin D, Cholecalciferol, 50 MCG (2000 UT) CAPS Take 1 capsule by mouth daily.     No current facility-administered medications for this visit.      Objective:  BP (!) 133/100 (BP Location: Left Arm, Patient Position: Sitting, Cuff Size: Large)   Pulse 60   Temp (!) 97.1 F (36.2 C) (Oral)  Gen: NAD, resting comfortably Points to left side of throat as source of pain- possible small enlarged lymph node Lungs: nonlabored, normal respiratory rate  Skin: appears dry, no obvious rash  Able to upload picture of throat from Saturday- appears to have small white patch in left palate- on picture today this is smaller. Has erythema surrounding area and that is improving.     Assessment and Plan   # Left sided sore throat S:has been a bad allergy season. Was having a sore throat and postnasal drip. About 2 weeks ago- Sore throat seemed to get worse- seeems to be mainly on the left side- has noted small whitish patch in back of throat. Has had someowhat metallic taste. Has been garglign with salt water. Wonders about possible abscess. Seems beyond his normal allergies.   Some itchy eyes but not sneezing much. Get some pressure into left ear. Has not lost sense of smell. No fever.  On zyrtec and flonase- has been doing nasal saline occasionally and then benadryl at night sometimes.  A/P: over 2 weeks of sore throat- allergies could contribute but appears to also have canker sore in back of mouth- we discussed possibly sore throat combo of canker sore and allergies. With 2 weeks of symptoms patient concerned about bacterial cause or abscess. Doubt abscess and thought likelihood of strep was low. Given 2 weeks of symptoms -we opted to give him augmentin as a back up if fails to improve or symptoms worsen.  - patient is already staying at home/working  from home- no work restrictions needed at [employer].   #elevated blood pressure S: on metoprolol for migraines but usually BP well controlled. Husband got home cuff and BP high today the few times hes checked it BP Readings from Last 3 Encounters:  01/28/19 (!) 133/100  12/13/18 120/80  10/15/18 132/88  A/P: no history of hypertension- he is going to monitor at home- if doesn't come down over next week may need to have a drive up BP check with me (would want sore throat to be gone for 3 days)    Future Appointments  Date Time Provider [place]  03/12/2019  2:20 PM Marin Olp, MD [facility]  04/22/2019  8:30 AM Pieter Partridge, DO [facility] None   Lab/Order associations: Sore throat  Elevated blood pressure reading  Meds ordered this encounter  Medications  . amoxicillin-clavulanate (AUGMENTIN) 875-125 MG tablet    Sig: Take 1 tablet by mouth 2 (two) times daily for 7 days.    Dispense:  14 tablet    Refill:  0    Return precautions advised.  [name], MD

## 2019-01-28 NOTE — Patient Instructions (Signed)
Video visit

## 2019-01-30 MED ORDER — GALCANEZUMAB-GNLM 120 MG/ML ~~LOC~~ SOAJ: 120.0000 mg | SUBCUTANEOUS | 11 refills | Status: DC

## 2019-01-31 ENCOUNTER — Encounter: Payer: Self-pay | Admitting: Family Medicine

## 2019-02-04 NOTE — Telephone Encounter (Signed)
Please see message and advise.  Thank you. ° °

## 2019-02-14 ENCOUNTER — Encounter: Payer: Self-pay | Admitting: Family Medicine

## 2019-03-07 ENCOUNTER — Encounter: Payer: Self-pay | Admitting: Family Medicine

## 2019-03-12 ENCOUNTER — Ambulatory Visit: Payer: BC Managed Care – PPO | Admitting: Family Medicine

## 2019-03-12 ENCOUNTER — Encounter: Payer: Self-pay | Admitting: Family Medicine

## 2019-03-12 ENCOUNTER — Other Ambulatory Visit: Payer: Self-pay

## 2019-03-12 VITALS — BP 132/84 | HR 71 | Temp 98.2°F | Ht 70.0 in | Wt 188.4 lb

## 2019-03-12 DIAGNOSIS — R030 Elevated blood-pressure reading, without diagnosis of hypertension: Secondary | ICD-10-CM | POA: Diagnosis not present

## 2019-03-12 DIAGNOSIS — G43119 Migraine with aura, intractable, without status migrainosus: Secondary | ICD-10-CM | POA: Diagnosis not present

## 2019-03-12 DIAGNOSIS — I10 Essential (primary) hypertension: Secondary | ICD-10-CM | POA: Insufficient documentation

## 2019-03-12 DIAGNOSIS — F321 Major depressive disorder, single episode, moderate: Secondary | ICD-10-CM

## 2019-03-12 DIAGNOSIS — Z23 Encounter for immunization: Secondary | ICD-10-CM

## 2019-03-12 NOTE — Assessment & Plan Note (Signed)
S:emgality reduced severity of migraines but not frequency yet. Doesn't catch them as early so sometimes harder to abort the HA Reading computer screen- could not read before HA  Uses abortive therapy about 12-13 times per month-still above goal A/P: Patient is not sure if he is happy with current level of control- I think some of this is that the migraines are presenting differently as he does not note them until later into the headache pattern-I encouraged him to keep follow-up with Dr. Tomi Likens next month to discuss potential alterations in therapy- he is wondering about increasing Emgality dose (though I think he may be at the appropriate maintenance dose already)

## 2019-03-12 NOTE — Progress Notes (Signed)
Phone [phone]   Subjective:  [name] is a 58 y.o. year old very pleasant male patient who presents for/with See problem oriented charting Chief Complaint  Patient presents with  . Depression   ROS- slight edema in feet- worse on left. No fever, chills, cough. Some sadness from current events.    Past Medical History-  Patient Active Problem List   Diagnosis Date Noted  . Depression, major, single episode, moderate ([place]) 08/16/2018    Priority: High  . ULCERATIVE COLITIS-LEFT SIDE.  Follows with Dr. Fuller [facility] of [facility] 05/06/2008    Priority: High  . 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 Hyperglycemia 01/26/2018    Priority: Medium  . History of adenomatous polyp of colon 05/22/2017    Priority: Medium  . 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 Hyperlipidemia 05/15/2007    Priority: Medium  . Migraines 05/03/2007    Priority: Medium  . Vasovagal syncope 02/14/2017    Priority: Low  . Hearing loss 08/04/2016    Priority: Low  . Low back pain 02/09/2016    Priority: Low  . DOE (dyspnea on exertion) 04/15/2014    Priority: Low  . 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 Vitamin D deficiency 10/27/2009    Priority: Low  . GERD 01/31/2008    Priority: Low  . INTERNAL HEMORRHOIDS 01/23/2008    Priority: Low  . Allergic rhinitis 05/03/2007    Priority: Low  . Elevated blood pressure reading without diagnosis of hypertension 03/12/2019  . Family history of alpha 1 antitrypsin deficiency 06/14/2018    Medications- reviewed and updated Current Outpatient Medications  Medication Sig Dispense Refill  . amitriptyline (ELAVIL) 10 MG tablet TAKE 1 TABLET AT BEDTIME 90 tablet 3  . balsalazide (COLAZAL) 750 MG capsule TAKE 3 CAPSULES (=2250MG) 3TIMES A DAY. (Patient taking differently: TAKE 3 CAPSULES (=2250MG) 2TIMES A DAY.) 810 capsule 1  . cetirizine (ZYRTEC) 10 MG tablet Take 10 mg by mouth daily.    .  cyclobenzaprine (FLEXERIL) 10 MG tablet Take 1 tablet (10 mg total) by mouth 3 (three) times daily as needed for muscle spasms. 30 tablet 0  . diphenhydrAMINE (BENADRYL) 25 MG tablet Take 25 mg by mouth at bedtime as needed.    Marland Kitchen escitalopram (LEXAPRO) 10 MG tablet TAKE 1 TABLET BY MOUTH EVERY DAY 90 tablet 1  . fluticasone (FLONASE) 50 MCG/ACT nasal spray USE 1 SPRAY IN EACH NOSTRILTWICE DAILY 48 g 3  . Galcanezumab-gnlm (EMGALITY) 120 MG/ML SOAJ Inject 120 mg into the skin every 30 (thirty) days. 1 pen 11  . Galcanezumab-gnlm (EMGALITY) 120 MG/ML SOSY Inject 1 pen into the skin every 30 (thirty) days. 1 Syringe 5  . ibuprofen (ADVIL,MOTRIN) 200 MG tablet Take 200 mg by mouth as needed.      . Melatonin 1 MG TABS Take by mouth at bedtime.    . mesalamine (CANASA) 1000 MG suppository Place 1 suppository (1,000 mg total) rectally at bedtime. 90 suppository 0  . metoprolol tartrate (LOPRESSOR) 25 MG tablet TAKE 1 TABLET TWICE A DAY 180 tablet 3  . Naftifine HCl 2 % GEL Apply 1 application topically 2 (two) times daily. 60 g 2  . SUMAtriptan (IMITREX) 100 MG tablet TAKE 1 TABLET EVERY 2 HOURSAS NEEDED FOR MIGRAINE (2  TABLETS MAXIMUM PER DAY) 27 tablet 1  . Vitamin D, Cholecalciferol, 50 MCG (2000 UT) CAPS Take 1 capsule by mouth  daily.     No current facility-administered medications for this visit.      Objective:  BP 132/84   Pulse 71   Temp 98.2 F (36.8 C) (Oral)   Ht 5' 10"  (1.778 m)   Wt 188 lb 6.4 oz (85.5 kg)   SpO2 98%   BMI 27.03 kg/m  Gen: NAD, resting comfortably CV: RRR no murmurs rubs or gallops Lungs: CTAB no crackles, wheeze, rhonchi Abdomen: soft/nontender/nondistended/normal bowel sounds. No rebound or guarding.  Ext: slight edema in feet Skin: warm, dry Neuro: grossly normal, moves all extremities    Assessment and Plan   Elevated blood pressure reading without diagnosis of hypertension S: today blood pressure controlled on metoprolol 56m BID (though this is  primarily for migraines)  Had multiple home readings above 90 for diastolic but then over last week seems to have improved- does wonder if checking in the later in day has made difference. Had been drinking coffee at 7 am and bp at 10 am usually.  BP Readings from Last 3 Encounters:  03/12/19 132/84  01/28/19 (!) 133/100  12/13/18 120/80  A/P: BP controlled today but still concerned about home #s- continue to monitor for another week or two. He is going to try to vary BP checks to at least get further out from [place]- wonder if that is artificially inflating overall #s. We discussed some options - I think hctz or losartan are highest on his list if we need to start medication   Depression, major, single episode, moderate (H[facility]) S: phq9 doing well on lexapro 118mand with therapist/counseling. Has had some periods of feeling sad but has clear object- like covid or state of the world. 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  [facility]  Decreased Interest 0  Down, Depressed, Hopeless 0  PHQ - 2 Score 0  Altered sleeping 0  Tired, decreased energy 0  Change in appetite 0  Feeling bad or failure about yourself  0  Trouble concentrating 0  Moving slowly or fidgety/restless 0  Suicidal thoughts 0  PHQ-9 Score 0  Difficult doing work/chores Not difficult at all  A/P: Depression is in full remission-patient would like to come off Lexapro due to anorgasmia but also to see if depression will remain in remission- I suggested with current tumultuous times that we consider titrating off over 3 to 6 months but he would prefer to do this more rapidly so we opted for a 3-week taper-he is very self aware and will let usKoreanow if has worsening symptoms and we can restart medication or consider alternative though most SSRIs are going to have similar risk   Migraines S:emgality reduced severity of migraines but not frequency yet. Doesn't catch  them as early so sometimes harder to abort the HA Reading computer screen- could not read before HA  Uses abortive therapy about 12-13 times per month-still above goal A/P: Patient is not sure if he is happy with current level of control- I think some of this is that the migraines are presenting differently as he does not note them until later into the headache pattern-I encouraged him to keep follow-up with Dr. JaTomi Likensext month to discuss potential alterations in therapy- he is wondering about increasing Emgality dose (though I think he may be at the appropriate maintenance dose already)  Advised him to schedule physical when he si due for next one- we will be in contact  about BP- may have to change to hypertension title Future Appointments  Date Time Provider [place]  04/22/2019  8:30 AM Pieter Partridge, DO [facility] None   Lab/Order associations: Need for shingles vaccine - Plan: Varicella-zoster vaccine IM  Elevated blood pressure reading without diagnosis of hypertension  Depression, major, single episode, moderate (HCC)  Intractable migraine with 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  [name], MD

## 2019-03-12 NOTE — Patient Instructions (Addendum)
Take half tablet of lexapro everyday for 2 weeks then half tablet every other day for a week then stop.   Continue to monitor blood pressure and let me know in 2 weeks or so.   If we did need to do blood pressure medicine 1. Consider hydrochlorothiazide 12.45m 2. Consider amlodipine 2.569m(can cause ankle swelling though)  Those would probably be my top 2 choices. A lot of doctors start with lisinopril or losartan type medicines. We could consider those.   Thanks for doing final shingrix

## 2019-03-12 NOTE — Assessment & Plan Note (Signed)
S: today blood pressure controlled on metoprolol 19m BID (though this is primarily for migraines)  Had multiple home readings above 90 for diastolic but then over last week seems to have improved- does wonder if checking in the later in day has made difference. Had been drinking coffee at 7 am and bp at 10 am usually.  BP Readings from Last 3 Encounters:  03/12/19 132/84  01/28/19 (!) 133/100  12/13/18 120/80  A/P: BP controlled today but still concerned about home #s- continue to monitor for another week or two. He is going to try to vary BP checks to at least get further out from [place]- wonder if that is artificially inflating overall #s. We discussed some options - I think hctz or losartan are highest on his list if we need to start medication

## 2019-03-12 NOTE — Assessment & Plan Note (Signed)
S: phq9 doing well on lexapro 64m and with therapist/counseling. Has had some periods of feeling sad but has clear object- like covid or state of the world. 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  [facility]  Decreased Interest 0  Down, Depressed, Hopeless 0  PHQ - 2 Score 0  Altered sleeping 0  Tired, decreased energy 0  Change in appetite 0  Feeling bad or failure about yourself  0  Trouble concentrating 0  Moving slowly or fidgety/restless 0  Suicidal thoughts 0  PHQ-9 Score 0  Difficult doing work/chores Not difficult at all  A/P: Depression is in full remission-patient would like to come off Lexapro due to anorgasmia but also to see if depression will remain in remission- I suggested with current tumultuous times that we consider titrating off over 3 to 6 months but he would prefer to do this more rapidly so we opted for a 3-week taper-he is very self aware and will let uKoreaknow if has worsening symptoms and we can restart medication or consider alternative though most SSRIs are going to have similar risk

## 2019-03-27 ENCOUNTER — Encounter: Payer: Self-pay | Admitting: Family Medicine

## 2019-03-29 ENCOUNTER — Other Ambulatory Visit: Payer: Self-pay | Admitting: Family Medicine

## 2019-04-19 NOTE — Progress Notes (Signed)
Virtual Visit via Video Note The purpose of this virtual visit is to provide medical care while limiting exposure to the novel coronavirus.    Consent was obtained for video visit:  Yes.   Answered questions that patient had about telehealth interaction:  Yes.   I discussed the limitations, risks, security and privacy concerns of performing an evaluation and management service by telemedicine. I also discussed with the patient that there may be a patient responsible charge related to this service. The patient expressed understanding and agreed to proceed.  Pt location: Home Physician Location: Home Name of referring provider:  Marin Olp, MD I connected with [name] at patients initiation/request on 04/22/2019 at  8:30 AM EDT by video enabled telemedicine application and verified that I am speaking with the correct person using two identifiers. Pt MRN:  [id number] Pt DOB:  Nov 08, 1960 Video Participants:  [name]   History of Present Illness:  [name] is a 58 year old Caucasian man with depression, anxiety and Gilbert syndrome who follows up for migraines.  UPDATE: Last visit, he started Emgality.  He stopped Lexapro. Decreased headache frequency.  He notes changes in headaches.  Headaches now never has preceding paresthesias.  Headaches now occurring  Mostly in the evening.   5 headaches a couple of months ago, 8 headaches last month.  When he does have a headache, it is now severe.  They last 2 1/2 hours (usually needs second dose).  He is less likely feeling anxious about the headaches.  In March, he had a new migraine associated with aura.  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  He has had one subsequent event.    Current NSAIDS:  none Current analgesics:  Sumatriptan 14m Current triptans:  none Current ergotamine:  none Current anti-emetic: Promethazine 12.5 mg Current muscle relaxants:  Cyclobenzaprine Current anti-anxiolytic:  none Current sleep aide: melatonin  Current Antihypertensive medications: Metoprolol 25 mg twice daily Current Antidepressant medications: Amitriptyline 10 mg at bedtime Current Anticonvulsant medications:  none Current anti-CGRP:  Emgality Current Vitamins/Herbal/Supplements: Vitamin D, melatonin Current Antihistamines/Decongestants: Benadryl, Zyrtec Other therapy:  none Other medications:  none  Caffeine:  1 mug of coffee daily  Diet:  Drinks water Exercise:  yes Depression:  yes; Anxiety: yes Other pain:  Muscle aches Sleep hygiene:  Poor.  HISTORY: Onset:  Childhood and then since late 20s Location:  Unilateral (either side)  left behind eye radiating to back of head Quality:  pressure Initial Intensity:  7/10.  He denies new headache, thunderclap headache or severe headache that wakes her from sleep. Aura:  On two occasions had jagged lines in vision Premonitory Phase:  Mild numb sensation, flushing of ear on side of headache Postdrome:  A day he has a fatigue sensation Associated symptoms:  Photophobia, phonophobia, osmophobia, nausea.  He denies associated unilateral numbness or weakness. Initial Duration:  5-6 hours (if takes sumatriptan immediately, then 1 hour) Initial Frequency:  10 to 15 days Initial Frequency of abortive medication: at least 10 days a month Triggers:  Some strong smells, maybe chocolate or red wine, often after a stressful event. Relieving factors:  Sumatriptan Activity:  aggravates  Past NSAIDS:  Ibuprofen, naproxen Past analgesics:  Tylenol Past abortive triptans: rizatriptan, eletriptan? Past abortive ergotamine:  none Past muscle relaxants:  none Past anti-emetic:  none Past antihypertensive medications:  none Past antidepressant medications:  doxepin, Lexapro Past anticonvulsant medications:  none Past anti-CGRP:  none Past  vitamins/Herbal/Supplements:  none Past antihistamines/decongestants:   none Other past therapies:  none  Family history of headache:  Father, niece   Past Medical History: Past Medical History:  Diagnosis Date  . Allergic rhinitis   . Allergy   . Arthritis   . GERD (gastroesophageal reflux disease)   . Gilbert syndrome   . Heart murmur    tricuspid valve regurgitation  . Internal hemorrhoids   . Migraines   . Panic attack    occasional  . Syncope    on airplane in 12/2016/ due to stress  . 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 amitriptyline (ELAVIL) 10 MG tablet TAKE 1 TABLET AT BEDTIME  . balsalazide (COLAZAL) 750 MG capsule TAKE 3 CAPSULES (=2250MG) 3TIMES A DAY.  . cetirizine (ZYRTEC) 10 MG tablet Take 10 mg by mouth daily.  . cyclobenzaprine (FLEXERIL) 10 MG tablet Take 1 tablet (10 mg total) by mouth 3 (three) times daily as needed for muscle spasms.  . diphenhydrAMINE (BENADRYL) 25 MG tablet Take 25 mg by mouth at bedtime as needed.  Marland Kitchen escitalopram (LEXAPRO) 10 MG tablet TAKE 1 TABLET BY MOUTH EVERY DAY  . fluticasone (FLONASE) 50 MCG/ACT nasal spray USE 1 SPRAY IN EACH NOSTRILTWICE DAILY  . Galcanezumab-gnlm (EMGALITY) 120 MG/ML SOAJ Inject 120 mg into the skin every 30 (thirty) days.  . Galcanezumab-gnlm (EMGALITY) 120 MG/ML SOSY Inject 1 pen into the skin every 30 (thirty) days.  Marland Kitchen ibuprofen (ADVIL,MOTRIN) 200 MG tablet Take 200 mg by mouth as needed.    . Melatonin 1 MG TABS Take by mouth at bedtime.  . mesalamine (CANASA) 1000 MG suppository Place 1 suppository (1,000 mg total) rectally at bedtime.  . metoprolol tartrate (LOPRESSOR) 25 MG tablet TAKE 1 TABLET TWICE A DAY  . Naftifine HCl 2 % GEL Apply 1 application topically 2 (two) times daily.  . SUMAtriptan (IMITREX) 100 MG tablet TAKE 1 TABLET EVERY 2 HOURSAS NEEDED FOR MIGRAINE (2  TABLETS MAXIMUM PER DAY)  . Vitamin D, Cholecalciferol, 50 MCG (2000 UT) CAPS Take 1 capsule by mouth daily.  .  [DISCONTINUED] glycopyrrolate (ROBINUL) 2 MG tablet Take 2 mg by mouth 2 (two) times daily.     No facility-administered encounter medications on file as of 04/22/2019.     Allergies: Allergies  Allergen Reactions  . Shellfish Allergy Nausea And Vomiting    scallops  . Citrus     Oral pain, rash in mouth  . Codeine Nausea Only  . Salmon [Fish Allergy] Nausea Only and Rash    Especially smoked salmon    Family History: Family History  Problem Relation Age of Onset  . Thyroid disease Father   . Heart disease Father        a fib, expanded aorta  . Arthritis Mother   . Colon cancer Other        Grandmother's Family (x 1 brother to grandmother and 1 sister to grandmother)  . Crohn's disease Paternal Aunt   . Crohn's disease Cousin   . Colon polyps Maternal Grandmother        Great Aunt, Great Uncle  . Heart disease Brother   . Colon polyps Brother   . Esophageal cancer Neg Hx   . Stomach cancer Neg Hx   . Rectal cancer Neg Hx     Social History: Social History   Socioeconomic History  . Marital status: Married    Spouse name: Domestic  partner  . Number of children: 0  . Years of education: Not on file  . Highest education level: Doctorate  Occupational History  . Occupation: Teacher, adult education: [employer]  Social Needs  . Financial resource strain: Not on file  . Food insecurity    Worry: Not on file    Inability: Not on file  . Transportation needs    Medical: Not on file    Non-medical: Not on file  Tobacco Use  . Smoking status: Never Smoker  . Smokeless tobacco: Never Used  Substance and Sexual Activity  . Alcohol use: Yes    Comment: 2 a day/14 a week  . Drug use: No  . Sexual activity: Not on file  Lifestyle  . Physical activity    Days per week: Not on file    Minutes per session: Not on file  . Stress: Not on file  Relationships  . Social Herbalist on phone: Not on file    Gets together: Not on file    Attends religious  service: Not on file    Active member of club or organization: Not on file    Attends meetings of clubs or organizations: Not on file    Relationship status: Not on file  . Intimate partner violence    Fear of current or ex partner: Not on file    Emotionally abused: Not on file    Physically abused: Not on file    Forced sexual activity: Not on file  Other Topics Concern  . Not on file  Social History Narrative   Lives with husband Dwaine Deter also a patient of Dr. Yong Channel). No pets or adopted children or natural children.       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       Hobbies: walking, hiking, reading, music, cooking , gardening.       Right handed.     Observations/Objective:   Blood pressure (!) 128/103, pulse 73, temperature 97.6 F (36.4 C), temperature source Oral, height 5' 10"  (1.778 m), weight 186 lb (84.4 kg). No acute distress.  Alert and oriented.  Speech fluent and not dysarthric.  Language intact.  Eyes orthophoric on primary gaze.  Face symmetric.  Assessment and Plan:   Migraine with aura, without status migrainosus, not intractable  1.  For preventative management, Emgality.  If needed, we can always increase amitriptyline to 53m at bedtime. 2.  For abortive therapy, will take 103msumatriptan 3.  Limit use of pain relievers to no more than 2 days out of week to prevent risk of rebound or medication-overuse headache. 4.  Keep headache diary 5.  Exercise, hydration, caffeine cessation, sleep hygiene, monitor for and avoid triggers 6.  Consider:  magnesium citrate 40015maily, riboflavin 400m56mily, and coenzyme Q10 100mg21mee times daily 7. Always keep in mind that currently taking a hormone or birth control may be a possible trigger or aggravating factor for migraine. 8. Follow up 4 months   Follow Up Instructions:    -I discussed the assessment and treatment plan with the patient. The patient was provided an opportunity to ask  questions and all were answered. The patient agreed with the plan and demonstrated an understanding of the instructions.   The patient was advised to call back or seek an in-person evaluation if the symptoms worsen or if the condition fails to improve as anticipated.  [name] Major

## 2019-04-22 ENCOUNTER — Encounter: Payer: Self-pay | Admitting: Family Medicine

## 2019-04-22 ENCOUNTER — Other Ambulatory Visit: Payer: Self-pay

## 2019-04-22 ENCOUNTER — Encounter: Payer: Self-pay | Admitting: Neurology

## 2019-04-22 ENCOUNTER — Telehealth (INDEPENDENT_AMBULATORY_CARE_PROVIDER_SITE_OTHER): Payer: BC Managed Care – PPO | Admitting: Neurology

## 2019-04-22 VITALS — BP 128/103 | HR 73 | Temp 97.6°F | Ht 70.0 in | Wt 186.0 lb

## 2019-04-22 DIAGNOSIS — G43109 Migraine with aura, not intractable, without status migrainosus: Secondary | ICD-10-CM

## 2019-04-22 MED ORDER — METOPROLOL TARTRATE 25 MG PO TABS: 25.0000 mg | ORAL_TABLET | Freq: Two times a day (BID) | ORAL | 1 refills | Status: DC

## 2019-04-22 MED ORDER — AMITRIPTYLINE HCL 10 MG PO TABS: 10.0000 mg | ORAL_TABLET | Freq: Every day | ORAL | 1 refills | Status: DC

## 2019-05-27 ENCOUNTER — Encounter: Payer: Self-pay | Admitting: Family Medicine

## 2019-05-29 NOTE — Progress Notes (Signed)
Phone [phone]   Subjective:  Virtual visit via Video note. Chief complaint: Chief Complaint  Patient presents with  . Elevated BP readings    This visit type was conducted due to national recommendations for restrictions regarding the COVID-19 Pandemic (e.g. social distancing).  This format is felt to be most appropriate for this patient at this time balancing risks to patient and risks to population by having him in for in person visit.  No physical exam was performed (except for noted visual exam or audio findings with Telehealth visits).    Our team/I connected with [name] at 11:00 AM EDT by a video enabled telemedicine application ([email or web address] or caregility through epic) and verified that I am speaking with the correct person using two identifiers.  Location patient: Home[facility] Location provider: [facility], office Persons participating in the virtual visit:  patient  Our team/I discussed the limitations of evaluation and management by telemedicine and the availability of in person appointments. In light of current covid-19 pandemic, patient also understands that we are trying to protect them by minimizing in office contact if at all possible.  The patient expressed consent for telemedicine visit and agreed to proceed. Patient understands insurance will be billed.   ROS- exercising regularly by walking- no reported issues like chest pain or shortness of breath. Mild edema at times- never went away from last winter. No fever chills reported.    Past Medical History-  Patient Active Problem List   Diagnosis Date Noted  . Depression, major, single episode, moderate ([place]) 08/16/2018    Priority: High  . ULCERATIVE COLITIS-LEFT SIDE.  Follows with Dr. Fuller [facility] of [facility] 05/06/2008    Priority: High  . 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 Hyperglycemia 01/26/2018    Priority: Medium  . History of adenomatous polyp of colon 05/22/2017    Priority: Medium  .  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 Hyperlipidemia 05/15/2007    Priority: Medium  . Migraines 05/03/2007    Priority: Medium  . Vasovagal syncope 02/14/2017    Priority: Low  . Hearing loss 08/04/2016    Priority: Low  . Low back pain 02/09/2016    Priority: Low  . DOE (dyspnea on exertion) 04/15/2014    Priority: Low  . 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 Vitamin D deficiency 10/27/2009    Priority: Low  . GERD 01/31/2008    Priority: Low  . INTERNAL HEMORRHOIDS 01/23/2008    Priority: Low  . Allergic rhinitis 05/03/2007    Priority: Low  . Essential hypertension 03/12/2019  . Family history of alpha 1 antitrypsin deficiency 06/14/2018    Medications- reviewed and updated Current Outpatient Medications  Medication Sig Dispense Refill  . amitriptyline (ELAVIL) 10 MG tablet Take 1 tablet (10 mg total) by mouth at bedtime. 90 tablet 1  . balsalazide (COLAZAL) 750 MG capsule TAKE 3 CAPSULES (=2250MG) 3TIMES A DAY. 810 capsule 1  . cetirizine (ZYRTEC) 10 MG tablet Take 10 mg by mouth daily.    . cyclobenzaprine (FLEXERIL) 10 MG tablet Take 1 tablet (10 mg total) by mouth 3 (three) times daily as needed for muscle spasms. 30 tablet 0  . diphenhydrAMINE (BENADRYL) 25 MG tablet Take 25 mg by mouth at bedtime as needed.    . fluticasone (FLONASE) 50 MCG/ACT nasal spray USE 1 SPRAY IN EACH NOSTRILTWICE DAILY 48 g 3  . Galcanezumab-gnlm (EMGALITY) 120 MG/ML  SOAJ Inject 120 mg into the skin every 30 (thirty) days. 1 pen 11  . ibuprofen (ADVIL,MOTRIN) 200 MG tablet Take 200 mg by mouth as needed.      . Melatonin 1 MG TABS Take by mouth at bedtime.    . mesalamine (CANASA) 1000 MG suppository Place 1 suppository (1,000 mg total) rectally at bedtime. 90 suppository 0  . metoprolol tartrate (LOPRESSOR) 25 MG tablet Take 1 tablet (25 mg total) by mouth 2 (two) times daily. 180 tablet 1  . Naftifine HCl 2 % GEL Apply 1 application  topically 2 (two) times daily. 60 g 2  . SUMAtriptan (IMITREX) 100 MG tablet TAKE 1 TABLET EVERY 2 HOURSAS NEEDED FOR MIGRAINE (2  TABLETS MAXIMUM PER DAY) 27 tablet 1  . Vitamin D, Cholecalciferol, 50 MCG (2000 UT) CAPS Take 1 capsule by mouth daily.    . chlorthalidone (HYGROTON) 25 MG tablet Take 0.5 tablets (12.5 mg total) by mouth daily. 45 tablet 3   No current facility-administered medications for this visit.      Objective:  BP (!) 136/111 (BP Location: Right Arm, Cuff Size: Normal)   Pulse 67   Temp 98.2 F (36.8 C) (Oral)   Ht 5' 10"  (1.778 m)   Wt 189 lb (85.7 kg)   SpO2 97%   BMI 27.12 kg/m  self reported vitals Gen: NAD, resting comfortably Lungs: nonlabored, normal respiratory rate  Skin: appears dry, no obvious rash    Assessment and Plan   # Hypertension S:elevated blood pressures in past but later have trended back down. On metoprolol at baseline for migraines.    April and may BP had increased. June was better. July was better and then since then has seemed to trend back up and stay up diastolic. Systolic pressures from 710-626 but average is under 140. Diastolic #s 94W to 546E. Some #s down into 80s.  averaging in 90s overall.   Walking 40 minutes a day. Tries to watch salt- minimal processed foods- some salting of his foods. Fair amount of fruits/veggies each week.   Swelling in feet back in the fall- prefers to avoid amlodipine and may increase benefit of diuretic. Nocturia in past but not recently- no major urinary issues A/P: new diagnosis of hypertension- may be isolated diastolic but harder to tell as already on metoprolol at baseline.  Continue metoprolol. Add chlorthalidone 12.36m. advised him to update me 3 weeks after starting medicine with how BP is doing and may go to full tablet.   #  overweight S:weight largely stable over last few months but feels like fat content higher as not in gym due to covid  Wt Readings from Last 3 Encounters:  06/03/19  189 lb (85.7 kg)  04/22/19 186 lb (84.4 kg)  03/12/19 188 lb 6.4 oz (85.5 kg)  A/P: we went over lifestyle choices in correlation with BP- he is doing reasonably well with regular walking, eating balanced diet, limiting salt. Will continue to monitor- Encouraged need for healthy eating, regular exercise, weight loss.    Recommended follow up: 3 week update after starting BP meds by mychart Future Appointments  Date Time Provider DAlva 08/23/2019  8:50 AM JMetta ClinesR, DO [facility] None    Lab/Order associations:   ICD-10-CM   1. Overweight  E66.3   2. Essential hypertension  I10     Meds ordered this encounter  Medications  . chlorthalidone (HYGROTON) 25 MG tablet    Sig: Take 0.5 tablets (12.5 mg  total) by mouth daily.    Dispense:  45 tablet    Refill:  3   Return precautions advised.  [name], MD

## 2019-05-29 NOTE — Patient Instructions (Signed)
Health Maintenance Due  Topic Date Due  . INFLUENZA VACCINE  05/11/2019     Depression screen [facility] 2/9 03/12/2019 10/15/2018 08/16/2018  Decreased Interest 0 0 1  Down, Depressed, Hopeless 0 1 3  PHQ - 2 Score 0 1 4  Altered sleeping 0 1 2  Tired, decreased energy 0 1 2  Change in appetite 0 0 1  Feeling bad or failure about yourself  0 0 1  Trouble concentrating 0 0 1  Moving slowly or fidgety/restless 0 0 1  Suicidal thoughts 0 0 0  PHQ-9 Score 0 3 12  Difficult doing work/chores Not difficult at all Not difficult at all Somewhat difficult

## 2019-06-03 ENCOUNTER — Encounter: Payer: Self-pay | Admitting: Family Medicine

## 2019-06-03 ENCOUNTER — Ambulatory Visit (INDEPENDENT_AMBULATORY_CARE_PROVIDER_SITE_OTHER): Payer: BC Managed Care – PPO | Admitting: Family Medicine

## 2019-06-03 VITALS — BP 136/111 | HR 67 | Temp 98.2°F | Ht 70.0 in | Wt 189.0 lb

## 2019-06-03 DIAGNOSIS — I10 Essential (primary) hypertension: Secondary | ICD-10-CM

## 2019-06-03 DIAGNOSIS — E663 Overweight: Secondary | ICD-10-CM | POA: Diagnosis not present

## 2019-06-03 MED ORDER — CHLORTHALIDONE 25 MG PO TABS: 12.5000 mg | ORAL_TABLET | Freq: Every day | ORAL | 3 refills | Status: DC

## 2019-06-03 NOTE — Assessment & Plan Note (Signed)
NEW diagnosis  S:elevated blood pressures in past but later have trended back down. On metoprolol at baseline for migraines.    April and may BP had increased. June was better. July was better and then since then has seemed to trend back up and stay up diastolic. Systolic pressures from 248-185 but average is under 140. Diastolic #s 90B to 311E. Some #s down into 80s.  averaging in 90s overall.   Walking 40 minutes a day. Tries to watch salt- minimal processed foods- some salting of his foods. Fair amount of fruits/veggies each week.   Swelling in feet back in the fall- prefers to avoid amlodipine and may increase benefit of diuretic. Nocturia in past but not recently- no major urinary issues A/P: new diagnosis of hypertension- may be isolated diastolic but harder to tell as already on metoprolol at baseline.  Continue metoprolol. Add chlorthalidone 12.42m. advised him to update me 3 weeks after starting medicine with how BP is doing and may go to full tablet.

## 2019-06-11 ENCOUNTER — Other Ambulatory Visit: Payer: Self-pay | Admitting: Neurology

## 2019-06-11 MED ORDER — AMITRIPTYLINE HCL 25 MG PO TABS: 25.0000 mg | ORAL_TABLET | Freq: Every day | ORAL | 3 refills | Status: DC

## 2019-06-11 NOTE — Telephone Encounter (Signed)
I sent a new prescription and increased his amitriptyline to 44m at bedtime.  We can provide him samples of Nurtec.  He takes 1 tablet when he gets a migraine but he cannot repeat the dose (only 1 tablet in 24 hours).

## 2019-06-11 NOTE — Progress Notes (Signed)
Increased amitriptyline to 55m at bedtime

## 2019-06-13 ENCOUNTER — Other Ambulatory Visit: Payer: Self-pay | Admitting: *Deleted

## 2019-06-13 ENCOUNTER — Telehealth: Payer: Self-pay | Admitting: *Deleted

## 2019-06-13 DIAGNOSIS — G43109 Migraine with aura, not intractable, without status migrainosus: Secondary | ICD-10-CM

## 2019-06-13 MED ORDER — NURTEC 75 MG PO TBDP: 75.0000 mg | ORAL_TABLET | Freq: Once | ORAL | 0 refills | Status: AC

## 2019-06-13 NOTE — Telephone Encounter (Signed)
Patient sent in questions via my chart - discussed with MD and informed patient he will have 2 samples of nurtec at the front desk for him to pick up.  Also in response to his mychart message about patient's concerns for dementia with meds he is taking Dr. Tomi Likens said that is a very small risk. Patient verbalized understanding.

## 2019-06-25 ENCOUNTER — Other Ambulatory Visit: Payer: Self-pay | Admitting: Neurology

## 2019-06-26 NOTE — Telephone Encounter (Signed)
Requested Prescriptions   Pending Prescriptions Disp Refills  . amitriptyline (ELAVIL) 25 MG tablet [Pharmacy Med Name: AMITRIPTYLINE HCL 25 MG TAB] 90 tablet 2    Sig: TAKE 1 TABLET BY MOUTH EVERYDAY AT BEDTIME   Rx last filled: 06/11/19 #30 3 refills  Pt last seen: 04/22/19  Follow up appt scheduled:08/23/19  Pharmacy requesting 90 day supply

## 2019-07-03 ENCOUNTER — Encounter: Payer: Self-pay | Admitting: Family Medicine

## 2019-07-03 MED ORDER — CHLORTHALIDONE 25 MG PO TABS: 25.0000 mg | ORAL_TABLET | Freq: Every day | ORAL | 0 refills | Status: DC

## 2019-07-15 ENCOUNTER — Other Ambulatory Visit: Payer: Self-pay | Admitting: Family Medicine

## 2019-07-25 ENCOUNTER — Encounter: Payer: Self-pay | Admitting: Family Medicine

## 2019-07-30 ENCOUNTER — Encounter: Payer: Self-pay | Admitting: Family Medicine

## 2019-07-30 MED ORDER — AMLODIPINE BESYLATE 2.5 MG PO TABS: 2.5000 mg | ORAL_TABLET | Freq: Every day | ORAL | 3 refills | Status: DC

## 2019-08-15 ENCOUNTER — Encounter: Payer: Self-pay | Admitting: Family Medicine

## 2019-08-19 ENCOUNTER — Encounter: Payer: Self-pay | Admitting: Family Medicine

## 2019-08-19 ENCOUNTER — Ambulatory Visit: Payer: BC Managed Care – PPO | Admitting: Family Medicine

## 2019-08-19 ENCOUNTER — Other Ambulatory Visit: Payer: Self-pay

## 2019-08-19 DIAGNOSIS — I10 Essential (primary) hypertension: Secondary | ICD-10-CM | POA: Diagnosis not present

## 2019-08-19 MED ORDER — LOSARTAN POTASSIUM 50 MG PO TABS: 50.0000 mg | ORAL_TABLET | Freq: Every day | ORAL | 3 refills | Status: DC

## 2019-08-19 NOTE — Assessment & Plan Note (Signed)
S: compliant with amlodipine 2.5 mg (recent start, chlorthalidone 25 mg, metoprolol 25 mg BID. He has not noted improved #s on the amlodipine. Feels like systolic slightly higher.   On amlodipine- Slight puffiness intermittently in left foot, some more dizziness when standing up flushed feet out of shower. Has some back pain but not sure if associated.  Doing a good job with low salt intake. 8k steps most day at least- twice a day walking for half hour. Not as intense when going to gym.   BP Readings from Last 3 Encounters:  08/19/19 138/90  06/03/19 (!) 136/111  04/22/19 (!) 128/103  A/P: Isolated diastolic hypertension-poor control today.  No obvious improvement on amlodipine 2.5 mg and with some side effects.  We will stop amlodipine.  His brother and father have taken losartan for connective tissue disease and seemed to tolerate well-we opted to add losartan 50 mg with my chart update in 3 weeks.  May increase to 100 mg potentially -Continue chlorthalidone 25 mg and metoprolol for now - home cuff pretty close on readings when held at same level.  -I wonder if not being able to go to the gym is contributing as sometimes diastolic pressure responds best to diet and exercise and he seems to be doing reasonably well with diet.  Walking twice a day but not as intense exercise as he is used to.

## 2019-08-19 NOTE — Progress Notes (Signed)
Phone [phone] In person visit   Subjective:   [name] is a 58 y.o. year old very pleasant male patient who presents for/with See problem oriented charting Chief Complaint  Patient presents with  . Follow-up  . Hypertension    ROS-some intermittent foot swelling.  Continues to have migraines.  No fever or chills.  Some postnasal drip/throat irritation but chronic condition   Past Medical History-  Patient Active Problem List   Diagnosis Date Noted  . Essential hypertension 03/12/2019    Priority: High  . Depression, major, single episode, moderate ([place]) 08/16/2018    Priority: High  . ULCERATIVE COLITIS-LEFT SIDE.  Follows with Dr. Fuller [facility] 05/06/2008    Priority: High  . 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 Hyperglycemia 01/26/2018    Priority: Medium  . History of adenomatous polyp of colon 05/22/2017    Priority: Medium  . 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 Hyperlipidemia 05/15/2007    Priority: Medium  . Migraines 05/03/2007    Priority: Medium  . Vasovagal syncope 02/14/2017    Priority: Low  . Hearing loss 08/04/2016    Priority: Low  . Low back pain 02/09/2016    Priority: Low  . DOE (dyspnea on exertion) 04/15/2014    Priority: Low  . 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 Vitamin D deficiency 10/27/2009    Priority: Low  . GERD 01/31/2008    Priority: Low  . INTERNAL HEMORRHOIDS 01/23/2008    Priority: Low  . Allergic rhinitis 05/03/2007    Priority: Low  . Family history of alpha 1 antitrypsin deficiency 06/14/2018    Medications- reviewed and updated Current Outpatient Medications  Medication Sig Dispense Refill  . amitriptyline (ELAVIL) 25 MG tablet TAKE 1 TABLET BY MOUTH EVERYDAY AT BEDTIME 90 tablet 2  . balsalazide (COLAZAL) 750 MG capsule TAKE 3 CAPSULES (=2250MG) 3TIMES A DAY. 810 capsule 1  . cetirizine (ZYRTEC) 10 MG tablet Take 10 mg by mouth  daily.    . chlorthalidone (HYGROTON) 25 MG tablet Take 1 tablet (25 mg total) by mouth daily. 90 tablet 0  . diphenhydrAMINE (BENADRYL) 25 MG tablet Take 25 mg by mouth at bedtime as needed.    . fluticasone (FLONASE) 50 MCG/ACT nasal spray USE 1 SPRAY IN EACH NOSTRILTWICE DAILY 48 g 3  . Galcanezumab-gnlm (EMGALITY) 120 MG/ML SOAJ Inject 120 mg into the skin every 30 (thirty) days. 1 pen 11  . ibuprofen (ADVIL,MOTRIN) 200 MG tablet Take 200 mg by mouth as needed.      . Melatonin 1 MG TABS Take by mouth at bedtime.    . metoprolol tartrate (LOPRESSOR) 25 MG tablet Take 1 tablet (25 mg total) by mouth 2 (two) times daily. 180 tablet 1  . SUMAtriptan (IMITREX) 100 MG tablet TAKE 1 TABLET EVERY 2 HOURSAS NEEDED FOR MIGRAINE (2  TABLETS MAXIMUM PER DAY) 27 tablet 1  . Vitamin D, Cholecalciferol, 50 MCG (2000 UT) CAPS Take 1 capsule by mouth daily.    Marland Kitchen losartan (COZAAR) 50 MG tablet Take 1 tablet (50 mg total) by mouth daily. 90 tablet 3  . mesalamine (CANASA) 1000 MG suppository Place 1 suppository (1,000 mg total) rectally at bedtime. (Patient not taking: Reported on 08/19/2019) 90 suppository 0   No current facility-administered medications for this visit.      Objective:  BP 138/90   Pulse 75   Temp  97.8 F (36.6 C) (Temporal)   Ht 5' 10"  (1.778 m)   Wt 189 lb (85.7 kg)   BMI 27.12 kg/m  Gen: NAD, resting comfortably CV: RRR no murmurs rubs or gallops Lungs: CTAB no crackles, wheeze, rhonchi Ext: no edema Skin: warm, dry    Assessment and Plan   Essential hypertension S: compliant with amlodipine 2.5 mg (recent start, chlorthalidone 25 mg, metoprolol 25 mg BID. He has not noted improved #s on the amlodipine. Feels like systolic slightly higher.   On amlodipine- Slight puffiness intermittently in left foot, some more dizziness when standing up flushed feet out of shower. Has some back pain but not sure if associated.  Doing a good job with low salt intake. 8k steps most day at  least- twice a day walking for half hour. Not as intense when going to gym.   BP Readings from Last 3 Encounters:  08/19/19 138/90  06/03/19 (!) 136/111  04/22/19 (!) 128/103  A/P: Isolated diastolic hypertension-poor control today.  No obvious improvement on amlodipine 2.5 mg and with some side effects.  We will stop amlodipine.  His brother and father have taken losartan for connective tissue disease and seemed to tolerate well-we opted to add losartan 50 mg with my chart update in 3 weeks.  May increase to 100 mg potentially -Continue chlorthalidone 25 mg and metoprolol for now - home cuff pretty close on readings when held at same level.  -I wonder if not being able to go to the gym is contributing as sometimes diastolic pressure responds best to diet and exercise and he seems to be doing reasonably well with diet.  Walking twice a day but not as intense exercise as he is used to.   Recommended follow up: 3-week MyChart update Future Appointments  Date Time Provider [place]  08/23/2019  8:50 AM Metta Clines R, DO [facility] None   Lab/Order associations:   ICD-10-CM   1. Essential hypertension  I10     Meds ordered this encounter  Medications  . losartan (COZAAR) 50 MG tablet    Sig: Take 1 tablet (50 mg total) by mouth daily.    Dispense:  90 tablet    Refill:  3    Return precautions advised.  [name], MD

## 2019-08-19 NOTE — Patient Instructions (Addendum)
Stop amlodipine 2.5 mg  Start losartan 50 mg before bed  Update me in 3 weeks by mychart. May titrate to 153m.

## 2019-08-21 NOTE — Progress Notes (Signed)
Virtual Visit via Video Note The purpose of this virtual visit is to provide medical care while limiting exposure to the novel coronavirus.    Consent was obtained for video visit:  Yes Answered questions that patient had about telehealth interaction:  Yes I discussed the limitations, risks, security and privacy concerns of performing an evaluation and management service by telemedicine. I also discussed with the patient that there may be a patient responsible charge related to this service. The patient expressed understanding and agreed to proceed.  Pt location: Home Physician Location: office Name of referring provider:  Marin Olp, MD I connected with [name] at patients initiation/request on 08/23/2019 at  8:50 AM EST by video enabled telemedicine application and verified that I am speaking with the correct person using two identifiers. Pt MRN:  [id number] Pt DOB:  Aug 01, 1961 Video Participants:  [name]   History of Present Illness:  [name] is a 58 year old Caucasian man with depression, anxiety and Gilbert syndrome who follows up for migraines.  UPDATE: Headaches improved in September. Intensity:  Moderate (1/3 may be severe) Duration:  1 hour Frequency:  7 days a month  He had a spasm on side of his left eye radiating to the cheek.  It was an electric shock.  It lasted a second and occurred 3 times.  Eye exam was fine.    Current NSAIDS:none Current analgesics:Sumatriptan 62m Current triptans:none Current ergotamine:none Current anti-emetic:Promethazine 12.5 mg Current muscle relaxants:Cyclobenzaprine Current anti-anxiolytic:none Current sleep aide:melatonin  Current Antihypertensive medications:Metoprolol 25 mg twice daily; losartan Current Antidepressant medications:Amitriptyline 25 mg at bedtime Current Anticonvulsant medications:none Current anti-CGRP:Emgality; Nurtec (effective but comparable to sumatriptan in  efficacy)  Current Vitamins/Herbal/Supplements:Vitamin D, melatonin Current Antihistamines/Decongestants:Benadryl, Zyrtec Other therapy:none Other medications:none  Caffeine:1 mug of coffee daily Diet:Drinks water Exercise:yes Depression:yes; Anxiety:yes Other pain:Muscle aches Sleep hygiene:Poor.  HISTORY: Onset:  Childhood and then since late 20s Location:Unilateral (either side) left behind eye radiating to back of head Quality:pressure Initial Intensity:7/10.Hedenies new headache, thunderclap headache or severe headache that wakes herfrom sleep. Aura:On two occasions had jagged lines in vision Premonitory Phase:Mild numb sensation, flushing of ear on side of headache Postdrome:A day he has a fatigue sensation Associated symptoms:  Photophobia, phonophobia, osmophobia, nausea. Hedenies associated unilateral numbness or weakness. Initial Duration:5-6 hours (if takes sumatriptan immediately, then 1 hour) Initial Frequency:10 to 15 days Initial Frequency of abortive medication:at least 10 days a month Triggers:  Some strong smells, maybe chocolate or red wine, often after a stressful event. Relieving factors:  Sumatriptan Activity:aggravates  In March 2020, he had a new migraine associated with aura.  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  He has had one subsequent event.    Past NSAIDS:Ibuprofen, naproxen Past analgesics:Tylenol Past abortive triptans:rizatriptan, eletriptan? Past abortive ergotamine:none Past muscle relaxants:none Past anti-emetic:none Past antihypertensive medications:none Past antidepressant medications:doxepin, Lexapro Past anticonvulsant medications:none Past anti-CGRP:none Past vitamins/Herbal/Supplements:none Past antihistamines/decongestants:none Other past therapies:none  Family history of headache:Father,  niece  Past Medical History: Past Medical History:  Diagnosis Date  . Allergic rhinitis   . Allergy   . Arthritis   . GERD (gastroesophageal reflux disease)   . Gilbert syndrome   . Heart murmur    tricuspid valve regurgitation  . Internal hemorrhoids   . Migraines   . Panic attack    occasional  . Syncope    on airplane in 12/2016/ due to stress  . 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 amitriptyline (ELAVIL) 25 MG tablet TAKE 1 TABLET BY MOUTH EVERYDAY AT BEDTIME  . balsalazide (COLAZAL) 750 MG capsule TAKE 3 CAPSULES (=2250MG) 3TIMES A DAY.  . cetirizine (ZYRTEC) 10 MG tablet Take 10 mg by mouth daily.  . chlorthalidone (HYGROTON) 25 MG tablet Take 1 tablet (25 mg total) by mouth daily.  . diphenhydrAMINE (BENADRYL) 25 MG tablet Take 25 mg by mouth at bedtime as needed.  . fluticasone (FLONASE) 50 MCG/ACT nasal spray USE 1 SPRAY IN EACH NOSTRILTWICE DAILY  . Galcanezumab-gnlm (EMGALITY) 120 MG/ML SOAJ Inject 120 mg into the skin every 30 (thirty) days.  Marland Kitchen ibuprofen (ADVIL,MOTRIN) 200 MG tablet Take 200 mg by mouth as needed.    Marland Kitchen losartan (COZAAR) 50 MG tablet Take 1 tablet (50 mg total) by mouth daily.  . Melatonin 1 MG TABS Take by mouth at bedtime.  . mesalamine (CANASA) 1000 MG suppository Place 1 suppository (1,000 mg total) rectally at bedtime. (Patient not taking: Reported on 08/19/2019)  . metoprolol tartrate (LOPRESSOR) 25 MG tablet Take 1 tablet (25 mg total) by mouth 2 (two) times daily.  . SUMAtriptan (IMITREX) 100 MG tablet TAKE 1 TABLET EVERY 2 HOURSAS NEEDED FOR MIGRAINE (2  TABLETS MAXIMUM PER DAY)  . Vitamin D, Cholecalciferol, 50 MCG (2000 UT) CAPS Take 1 capsule by mouth daily.  . [DISCONTINUED] glycopyrrolate (ROBINUL) 2 MG tablet Take 2 mg by mouth 2 (two) times daily.     No facility-administered encounter medications on file as of 08/23/2019.     Allergies:  Allergies  Allergen Reactions  . Shellfish Allergy Nausea And Vomiting    scallops  . Citrus     Oral pain, rash in mouth  . Codeine Nausea Only  . Salmon [Fish Allergy] Nausea Only and Rash    Especially smoked salmon    Family History: Family History  Problem Relation Age of Onset  . Thyroid disease Father   . Heart disease Father        a fib, expanded aorta  . Arthritis Mother   . Colon cancer Other        Grandmother's Family (x 1 brother to grandmother and 1 sister to grandmother)  . Crohn's disease Paternal Aunt   . Crohn's disease Cousin   . Colon polyps Maternal Grandmother        Great Aunt, Great Uncle  . Heart disease Brother   . Colon polyps Brother   . Esophageal cancer Neg Hx   . Stomach cancer Neg Hx   . Rectal cancer Neg Hx     Social History: Social History   Socioeconomic History  . Marital status: Married    Spouse name: Domestic partner  . Number of children: 0  . Years of education: Not on file  . Highest education level: Doctorate  Occupational History  . Occupation: Teacher, adult education: [employer]  Social Needs  . Financial resource strain: Not on file  . Food insecurity    Worry: Not on file    Inability: Not on file  . Transportation needs    Medical: Not on file    Non-medical: Not on file  Tobacco Use  . Smoking status: Never Smoker  . Smokeless tobacco: Never Used  Substance and Sexual Activity  . Alcohol use: Yes    Comment: 2 a day/14 a week  . Drug use: No  . Sexual activity: Not on file  Lifestyle  .  Physical activity    Days per week: Not on file    Minutes per session: Not on file  . Stress: Not on file  Relationships  . Social Herbalist on phone: Not on file    Gets together: Not on file    Attends religious service: Not on file    Active member of club or organization: Not on file    Attends meetings of clubs or organizations: Not on file    Relationship status: Not on file  . Intimate  partner violence    Fear of current or ex partner: Not on file    Emotionally abused: Not on file    Physically abused: Not on file    Forced sexual activity: Not on file  Other Topics Concern  . Not on file  Social History Narrative   Lives with husband Dwaine Deter also a patient of Dr. Yong Channel). No pets or adopted children or natural children.       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       Hobbies: walking, hiking, reading, music, cooking , gardening.       Right handed.     Observations/Objective:   Height 5' 10"  (1.778 m), weight 186 lb (84.4 kg). No acute distress.  Alert and oriented.  Speech fluent and not dysarthric.  Language intact.  Eyes orthophoric on primary gaze.  Face symmetric.  Assessment and Plan:   1.  Migraine with aura, without status migrainosus, not intractable 2.  Possible left sided trigeminal neuralgia (V1).  Only 3 brief episodes.  Continue to monitor  1.  For preventative management, Emgality.  Will decrease amitriptyline back to 67m at bedtime with plan to discontinue at next follow up. 2.  For abortive therapy, sumatriptan 534m3.  Limit use of pain relievers to no more than 2 days out of week to prevent risk of rebound or medication-overuse headache. 4.  Keep headache diary 5.  Exercise, hydration, caffeine cessation, sleep hygiene, monitor for and avoid triggers 6.  Consider:  magnesium citrate 40072maily, riboflavin 400m60mily, and coenzyme Q10 100mg36mee times daily 7. Always keep in mind that currently taking a hormone or birth control may be a possible trigger or aggravating factor for migraine. 8. Follow up 4 months.   Follow Up Instructions:    -I discussed the assessment and treatment plan with the patient. The patient was provided an opportunity to ask questions and all were answered. The patient agreed with the plan and demonstrated an understanding of the instructions.   The patient was advised to call back  or seek an in-person evaluation if the symptoms worsen or if the condition fails to improve as anticipated.   Adam Dudley Major

## 2019-08-23 ENCOUNTER — Encounter: Payer: Self-pay | Admitting: Neurology

## 2019-08-23 ENCOUNTER — Telehealth (INDEPENDENT_AMBULATORY_CARE_PROVIDER_SITE_OTHER): Payer: BC Managed Care – PPO | Admitting: Neurology

## 2019-08-23 ENCOUNTER — Other Ambulatory Visit: Payer: Self-pay

## 2019-08-23 VITALS — Ht 70.0 in | Wt 186.0 lb

## 2019-08-23 DIAGNOSIS — G500 Trigeminal neuralgia: Secondary | ICD-10-CM

## 2019-08-23 DIAGNOSIS — G43109 Migraine with aura, not intractable, without status migrainosus: Secondary | ICD-10-CM

## 2019-08-23 MED ORDER — AMITRIPTYLINE HCL 10 MG PO TABS: 10.0000 mg | ORAL_TABLET | Freq: Every day | ORAL | 1 refills | Status: DC

## 2019-08-26 ENCOUNTER — Encounter: Payer: Self-pay | Admitting: Family Medicine

## 2019-09-02 ENCOUNTER — Other Ambulatory Visit: Payer: Self-pay | Admitting: Family Medicine

## 2019-09-09 ENCOUNTER — Encounter: Payer: Self-pay | Admitting: Family Medicine

## 2019-10-10 ENCOUNTER — Other Ambulatory Visit: Payer: Self-pay | Admitting: Family Medicine

## 2019-10-25 ENCOUNTER — Other Ambulatory Visit: Payer: Self-pay | Admitting: Family Medicine

## 2019-10-28 ENCOUNTER — Telehealth: Payer: Self-pay

## 2019-10-28 NOTE — Telephone Encounter (Signed)
Jamare Jacobo [facility] KeyVernell Morgans - PA Case ID: [id number] Need help? Call us at [phone] Status Sent to [facility] SUMAtriptan Succinate 100MG tablets Form [employer] Electronic PA Form (NCPDP)

## 2019-11-14 ENCOUNTER — Encounter: Payer: Self-pay | Admitting: Family Medicine

## 2019-11-15 ENCOUNTER — Ambulatory Visit (INDEPENDENT_AMBULATORY_CARE_PROVIDER_SITE_OTHER): Payer: BC Managed Care – PPO | Admitting: Family Medicine

## 2019-11-15 ENCOUNTER — Encounter: Payer: Self-pay | Admitting: Family Medicine

## 2019-11-15 ENCOUNTER — Telehealth: Payer: Self-pay | Admitting: Family Medicine

## 2019-11-15 ENCOUNTER — Other Ambulatory Visit: Payer: Self-pay

## 2019-11-15 VITALS — BP 118/80 | Temp 97.2°F | Ht 70.0 in | Wt 186.0 lb

## 2019-11-15 DIAGNOSIS — I7300 Raynaud's syndrome without gangrene: Secondary | ICD-10-CM

## 2019-11-15 DIAGNOSIS — R739 Hyperglycemia, unspecified: Secondary | ICD-10-CM | POA: Diagnosis not present

## 2019-11-15 DIAGNOSIS — I10 Essential (primary) hypertension: Secondary | ICD-10-CM

## 2019-11-15 DIAGNOSIS — Z125 Encounter for screening for malignant neoplasm of prostate: Secondary | ICD-10-CM

## 2019-11-15 DIAGNOSIS — Z0000 Encounter for general adult medical examination without abnormal findings: Secondary | ICD-10-CM

## 2019-11-15 DIAGNOSIS — E559 Vitamin D deficiency, unspecified: Secondary | ICD-10-CM

## 2019-11-15 DIAGNOSIS — E785 Hyperlipidemia, unspecified: Secondary | ICD-10-CM | POA: Diagnosis not present

## 2019-11-15 NOTE — Assessment & Plan Note (Signed)
#   concern for Poor Circulation in hands and feets S: pt c/o cold feet and hands (poor circulation) that's getting worse with changes in BP meds.  He states has always had somewhat poor circulation- even when he was little if was outside would have very cold toes in winter. Fingers get cold going for walk even with really warm gloves in his lifetime. He reports having "chilblains" or "pernio" in the past diagnosed by a physician.   Patient stated when we were increasing BP meds previously that feet would get splotchy getting out of shower. As it has gotten colder and he came off amlodipine has had pain in toes as felt blood recirculating. Usually happening in morning and afternoon. 2 of toes look somewhat bruised recently. Denies calf pain or tenderness. Did some ankle BP readings and noted 147/80 and 151/80.   Other reports with changes in BP meds vs being on emgality more long term- Migraines 15 down to 7 a month. Only 2 bad ones in January- thrilled for patient  A/P: I strongly suspect raynaud's syndrome. I suspect colder weather, pushing BPs lower overall and coming off amlodipine previously contributed.  We opted to  - cut chlorthalidone in half to 12.5 mg - amlodipine 2.5 mg through end of feb or warmer weather then go back to chlorthalidone full dose and stop amlodipine- can repeat in future years.  -Another option would be to try to full convert from chlorthalidone to amlodipien depending on how BP does (he prefers 3 BP meds over 4 if possible though beta blocker is for migraine history

## 2019-11-15 NOTE — Assessment & Plan Note (Signed)
#  hypertension S: compliant with chlorthalidone 25 mg, losartan 50 mg, metoprolol 25 mg twice a day  Prior on amlodipine 08/2019- "On amlodipine- Slight puffiness intermittently in left foot, some more dizziness when standing up flushed feet out of shower. Has some back pain but not sure if associated." BP Readings from Last 3 Encounters:  11/15/19 118/80  08/19/19 138/90  06/03/19 (!) 136/111  A/P: Patient with potential side effect of lowering blood pressure overall with worsening of Raynaud's-this was also likely contributed to by stopping amlodipine 2.5 mg previously.  We are going to continue losartan 50 mg and metoprolol 25 mg twice a day.  We are going to reduce chlorthalidone to 12.5 mg and try amlodipine 2.5 mg at least during winter months-can start back on full dose chlorthalidone when weather improves and stop amlodipine.

## 2019-11-15 NOTE — Patient Instructions (Signed)
There are no preventive care reminders to display for this patient. Depression screen [facility] 2/9 08/19/2019 03/12/2019 10/15/2018  Decreased Interest 0 0 0  Down, Depressed, Hopeless 0 0 1  PHQ - 2 Score 0 0 1  Altered sleeping 0 0 1  Tired, decreased energy 0 0 1  Change in appetite 0 0 0  Feeling bad or failure about yourself  0 0 0  Trouble concentrating 0 0 0  Moving slowly or fidgety/restless 0 0 0  Suicidal thoughts 0 0 0  PHQ-9 Score 0 0 3  Difficult doing work/chores Not difficult at all Not difficult at all Not difficult at all

## 2019-11-15 NOTE — Telephone Encounter (Signed)
Patient called in this afternoon saying that he was going to cx appt for today and wanted to get rescheduled, but the next available is not until March, is it okay to get in for Same Day.

## 2019-11-15 NOTE — Progress Notes (Signed)
Phone [phone] Virtual visit via Video note   Subjective:  Chief complaint: Chief Complaint  Patient presents with  . virtual  . poor circualtion in hands and feet   This visit type was conducted due to national recommendations for restrictions regarding the COVID-19 Pandemic (e.g. social distancing).  This format is felt to be most appropriate for this patient at this time balancing risks to patient and risks to population by having him in for in person visit.  No physical exam was performed (except for noted visual exam or audio findings with Telehealth visits).    Our team/I connected with [name] at  3:40 PM EST by a video enabled telemedicine application (doxy.me or caregility through epic) and verified that I am speaking with the correct person using two identifiers.  Location patient: Home[facility] Location provider: [facility], office Persons participating in the virtual visit:  patient  Our team/I discussed the limitations of evaluation and management by telemedicine and the availability of in person appointments. In light of current covid-19 pandemic, patient also understands that we are trying to protect them by minimizing in office contact if at all possible.  The patient expressed consent for telemedicine visit and agreed to proceed. Patient understands insurance will be billed.   Past Medical History-  Patient Active Problem List   Diagnosis Date Noted  . Depression, major, single episode, moderate ([place]) 08/16/2018    Priority: High  . ULCERATIVE COLITIS-LEFT SIDE.  Follows with Dr. Fuller [facility] 05/06/2008    Priority: High  . Essential hypertension 03/12/2019    Priority: Medium  . 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 Hyperglycemia 01/26/2018    Priority: Medium  . History of adenomatous polyp of colon 05/22/2017    Priority: Medium  . 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 Hyperlipidemia 05/15/2007    Priority: Medium  .  Migraines 05/03/2007    Priority: Medium  . Vasovagal syncope 02/14/2017    Priority: Low  . Hearing loss 08/04/2016    Priority: Low  . Low back pain 02/09/2016    Priority: Low  . DOE (dyspnea on exertion) 04/15/2014    Priority: Low  . 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 Vitamin D deficiency 10/27/2009    Priority: Low  . GERD 01/31/2008    Priority: Low  . INTERNAL HEMORRHOIDS 01/23/2008    Priority: Low  . Allergic rhinitis 05/03/2007    Priority: Low  . Raynaud's disease 11/15/2019  . Family history of alpha 1 antitrypsin deficiency 06/14/2018    Medications- reviewed and updated Current Outpatient Medications  Medication Sig Dispense Refill  . amitriptyline (ELAVIL) 10 MG tablet Take 1 tablet (10 mg total) by mouth at bedtime. 90 tablet 1  . balsalazide (COLAZAL) 750 MG capsule TAKE 3 CAPSULES (=2250MG) 3TIMES A DAY. 810 capsule 1  . cetirizine (ZYRTEC) 10 MG tablet Take 10 mg by mouth daily.    . chlorthalidone (HYGROTON) 25 MG tablet TAKE 1 TABLET BY MOUTH EVERY DAY 90 tablet 0  . diphenhydrAMINE (BENADRYL) 25 MG tablet Take 25 mg by mouth at bedtime as needed.    . fluticasone (FLONASE) 50 MCG/ACT nasal spray USE 1 SPRAY IN EACH NOSTRILTWICE DAILY 48 g 3  . Galcanezumab-gnlm (EMGALITY) 120 MG/ML SOAJ Inject 120 mg into the skin every 30 (thirty) days. 1 pen 11  . ibuprofen (ADVIL,MOTRIN) 200 MG tablet Take 200 mg by mouth  as needed.      Marland Kitchen losartan (COZAAR) 50 MG tablet Take 1 tablet (50 mg total) by mouth daily. 90 tablet 3  . Melatonin 1 MG TABS Take by mouth at bedtime.    . mesalamine (CANASA) 1000 MG suppository Place 1 suppository (1,000 mg total) rectally at bedtime. 90 suppository 0  . metoprolol tartrate (LOPRESSOR) 25 MG tablet TAKE 1 TABLET TWICE A DAY 180 tablet 1  . SUMAtriptan (IMITREX) 100 MG tablet TAKE 1 TABLET EVERY 2 HOURSAS NEEDED FOR MIGRAINE (2  TABLETS MAXIMUM PER DAY) 27 tablet 1  . Vitamin D,  Cholecalciferol, 50 MCG (2000 UT) CAPS Take 1 capsule by mouth daily.     No current facility-administered medications for this visit.     Objective:  BP 118/80   Temp (!) 97.2 F (36.2 C)   Ht 5' 10"  (1.778 m)   Wt 186 lb (84.4 kg)   BMI 26.69 kg/m  self reported vitals Gen: NAD, resting comfortably Lungs: nonlabored, normal respiratory rate  Skin: appears dry, no obvious rash     Assessment and Plan   # concern for Poor Circulation in hands and feets S: pt c/o cold feet and hands (poor circulation) that's getting worse with changes in BP meds.  He states has always had somewhat poor circulation- even when he was little if was outside would have very cold toes in winter. Fingers get cold going for walk even with really warm gloves in his lifetime. He reports having "chilblains" or "pernio" in the past diagnosed by a physician.   Patient stated when we were increasing BP meds previously that feet would get splotchy getting out of shower. As it has gotten colder and he came off amlodipine has had pain in toes as felt blood recirculating. Usually happening in morning and afternoon. 2 of toes look somewhat bruised recently. Denies calf pain or tenderness. Did some ankle BP readings and noted 147/80 and 151/80.   Other reports with changes in BP meds vs being on emgality more long term- Migraines 15 down to 7 a month. Only 2 bad ones in January- thrilled for patient  A/P: I strongly suspect raynaud's syndrome. I suspect colder weather, pushing BPs lower overall and coming off amlodipine previously contributed.  We opted to  - cut chlorthalidone in half to 12.5 mg - amlodipine 2.5 mg through end of feb or warmer weather then go back to chlorthalidone full dose and stop amlodipine- can repeat in future years.  -Another option would be to try to full convert from chlorthalidone to amlodipien depending on how BP does (he prefers 3 BP meds over 4 if possible though beta blocker is for  migraine history   #hypertension S: compliant with chlorthalidone 25 mg, losartan 50 mg, metoprolol 25 mg twice a day  Prior on amlodipine 08/2019- "On amlodipine- Slight puffiness intermittently in left foot, some more dizziness when standing up flushed feet out of shower. Has some back pain but not sure if associated." BP Readings from Last 3 Encounters:  11/15/19 118/80  08/19/19 138/90  06/03/19 (!) 136/111  A/P: Patient with potential side effect of lowering blood pressure overall with worsening of Raynaud's-this was also likely contributed to by stopping amlodipine 2.5 mg previously.  We are going to continue losartan 50 mg and metoprolol 25 mg twice a day.  We are going to reduce chlorthalidone to 12.5 mg and try amlodipine 2.5 mg at least during winter months-can start back on full dose chlorthalidone and  stop amlodipine when weather improves in regards to warmth  Team can refill amlodipine 2.5 mg for patient if needed daily #90 with 3 refills  Recommended follow up: 6 month physical or sooner if needed- ordered labs for CPE today Future Appointments  Date Time Provider [place]  12/31/2019  8:50 AM Pieter Partridge, DO [facility] None    Lab/Order associations: We did order labs for future physical today as well   ICD-10-CM   1. Raynaud's disease without gangrene  I73.00   2. Essential hypertension  I10   3. Hyperglycemia  R73.9 Hemoglobin A1c  4. Hyperlipidemia, unspecified hyperlipidemia type  E78.5 CBC with Differential/Platelet    Comprehensive metabolic panel    Lipid panel  5. Preventative health care  Z00.00 CBC with Differential/Platelet    Comprehensive metabolic panel    Lipid panel    PSA    Hemoglobin A1c    VITAMIN D 25 Hydroxy (Vit-D Deficiency, Fractures)  6. Screening for prostate cancer  Z12.5 PSA  7. Vitamin D deficiency  E55.9 VITAMIN D 25 Hydroxy (Vit-D Deficiency, Fractures)   Time Spent: 30 minutes of total time (20 minutes at time of visit, 10  minutes charting after visit) was spent on the date of the encounter performing the following actions: chart review prior to seeing the patient, obtaining history, performing a medically necessary exam, counseling on the treatment plan, placing orders, and documenting in our EHR.   Return precautions advised.  [name], MD

## 2019-12-30 ENCOUNTER — Encounter: Payer: Self-pay | Admitting: Neurology

## 2019-12-30 NOTE — Progress Notes (Signed)
Virtual Visit via Video Note The purpose of this virtual visit is to provide medical care while limiting exposure to the novel coronavirus.    Consent was obtained for video visit:  Yes.   Answered questions that patient had about telehealth interaction:  Yes.   I discussed the limitations, risks, security and privacy concerns of performing an evaluation and management service by telemedicine. I also discussed with the patient that there may be a patient responsible charge related to this service. The patient expressed understanding and agreed to proceed.  Pt location: Home Physician Location: office Name of referring provider:  Marin Olp, MD I connected with Horatio Pel at patients initiation/request on 12/31/2019 at  8:50 AM EDT by video enabled telemedicine application and verified that I am speaking with the correct person using two identifiers. Pt MRN:  [id number] Pt DOB:  1961-04-08 Video Participants:  Horatio Pel   History of Present Illness:  Derrall Hicks is a 59 year old Caucasian man with depression, anxiety and Gilbert syndrome whofollows up for migraines.  UPDATE: Headaches improved in September on [place].  Amitriptyline was decreased to 33m at bedtime with intention to discontinue. . Intensity:  Moderate (no severe) Duration:  1 hour Frequency:  7 days a month (often occur in clusters such as 3 in a row)  Current NSAIDS:none Current analgesics:Sumatriptan 521mCurrent triptans:none Current ergotamine:none Current anti-emetic:Promethazine 12.5 mg Current muscle relaxants:Cyclobenzaprine Current anti-anxiolytic:none Current sleep aide:melatonin Current Antihypertensive medications:Metoprolol 25 mg twice daily; losartan Current Antidepressant medications:Amitriptyline 10 mg at bedtime Current Anticonvulsant medications:none Current anti-CGRP:Emgality Current Vitamins/Herbal/Supplements:Vitamin D, melatonin Current  Antihistamines/Decongestants:Benadryl, Zyrtec Other therapy:none Other medications:none  Caffeine:1 mug of coffee daily Diet:Drinks water Exercise:yes Depression:yes; Anxiety:yes Other pain:Muscle aches Sleep hygiene:Poor.  HISTORY: Onset: Childhoodand then since late 20s Location:Unilateral (either side) left behind eye radiating to back of head Quality:pressure InitialIntensity:7/10.Hedenies new headache, thunderclap headache.  Sometimes wakes up with them. Aura:On rare occasions had jagged lines in vision Premonitory Phase:Mild numb sensation, flushing of ear on side of headache Postdrome:A day he has a fatigue sensation Associated symptoms: Photophobia, phonophobia, osmophobia, nausea. Hedenies associated unilateral numbness or weakness. InitialDuration:5-6 hours (if takes sumatriptan immediately, then 1 hour) InitialFrequency:10 to 15 days InitialFrequency of abortive medication:at least 10 days a month Triggers: Some strong smells, maybe chocolate or red wine, often after a stressful event. Relieving factors: Sumatriptan Activity:aggravates  In March 2020, he had a new migraine associated with aura. 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 He has had one subsequent event.  In October-November 2020, He had an episode of possible left-sided trigeminal neuralgia.  He described a spasm on side of his left eye radiating to the cheek.  It was an electric shock.  It lasted a second and occurred 3 times.  Eye exam was fine.   Past NSAIDS:Ibuprofen, naproxen Past analgesics:Tylenol Past abortive triptans:rizatriptan, eletriptan? Past abortive ergotamine:none Past muscle relaxants:none Past anti-emetic:none Past antihypertensive medications:none Past antidepressant medications:doxepin, Lexapro Past anticonvulsant medications:none Past  anti-CGRP:Nurtec (effective but comparable to sumatriptan in efficacy)  Past vitamins/Herbal/Supplements:none Past antihistamines/decongestants:none Other past therapies:none  Family history of headache:Father, niece  Past Medical History: Past Medical History:  Diagnosis Date  . Allergic rhinitis   . Allergy   . Arthritis   . GERD (gastroesophageal reflux disease)   . Gilbert syndrome   . Heart murmur    tricuspid valve regurgitation  . Internal hemorrhoids   . Migraines   . Panic attack    occasional  .  Syncope    on airplane in 12/2016/ due to stress  . 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 amitriptyline (ELAVIL) 10 MG tablet Take 1 tablet (10 mg total) by mouth at bedtime.  . balsalazide (COLAZAL) 750 MG capsule TAKE 3 CAPSULES (=2250MG) 3TIMES A DAY.  . cetirizine (ZYRTEC) 10 MG tablet Take 10 mg by mouth daily.  . chlorthalidone (HYGROTON) 25 MG tablet TAKE 1 TABLET BY MOUTH EVERY DAY  . diphenhydrAMINE (BENADRYL) 25 MG tablet Take 25 mg by mouth at bedtime as needed.  . fluticasone (FLONASE) 50 MCG/ACT nasal spray USE 1 SPRAY IN EACH NOSTRILTWICE DAILY  . Galcanezumab-gnlm (EMGALITY) 120 MG/ML SOAJ Inject 120 mg into the skin every 30 (thirty) days.  Marland Kitchen ibuprofen (ADVIL,MOTRIN) 200 MG tablet Take 200 mg by mouth as needed.    Marland Kitchen losartan (COZAAR) 50 MG tablet Take 1 tablet (50 mg total) by mouth daily.  . Melatonin 1 MG TABS Take by mouth at bedtime.  . mesalamine (CANASA) 1000 MG suppository Place 1 suppository (1,000 mg total) rectally at bedtime.  . metoprolol tartrate (LOPRESSOR) 25 MG tablet TAKE 1 TABLET TWICE A DAY  . SUMAtriptan (IMITREX) 100 MG tablet TAKE 1 TABLET EVERY 2 HOURSAS NEEDED FOR MIGRAINE (2  TABLETS MAXIMUM PER DAY)  . Vitamin D, Cholecalciferol, 50 MCG (2000 UT) CAPS Take 1 capsule by mouth daily.  . [DISCONTINUED] glycopyrrolate (ROBINUL) 2 MG  tablet Take 2 mg by mouth 2 (two) times daily.     No facility-administered encounter medications on file as of 12/31/2019.    Allergies: Allergies  Allergen Reactions  . Shellfish Allergy Nausea And Vomiting    scallops  . Citrus     Oral pain, rash in mouth  . Codeine Nausea Only  . Salmon [Fish Allergy] Nausea Only and Rash    Especially smoked salmon    Family History: Family History  Problem Relation Age of Onset  . Thyroid disease Father   . Heart disease Father        a fib, expanded aorta  . Arthritis Mother   . Colon cancer Other        Grandmother's Family (x 1 brother to grandmother and 1 sister to grandmother)  . Crohn's disease Paternal Aunt   . Crohn's disease Cousin   . Colon polyps Maternal Grandmother        Great Aunt, Great Uncle  . Heart disease Brother   . Colon polyps Brother   . Esophageal cancer Neg Hx   . Stomach cancer Neg Hx   . Rectal cancer Neg Hx     Social History: Social History   Socioeconomic History  . Marital status: Married    Spouse name: Domestic partner  . Number of children: 0  . Years of education: Not on file  . Highest education level: Doctorate  Occupational History  . Occupation: Secretary/administrator professor    Employer: [employer]  Tobacco Use  . Smoking status: Never Smoker  . Smokeless tobacco: Never Used  Substance and Sexual Activity  . Alcohol use: Yes    Comment: 2 a day/14 a week  . Drug use: No  . Sexual activity: Not on file  Other Topics Concern  . Not on file  Social History Narrative   Lives with husband Dwaine Deter also a patient of Dr. Yong Channel). No pets or adopted children or natural children.       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 Also husband is.       Hobbies: walking, hiking, reading, music, cooking , gardening.       Right handed.    Social Determinants of Health   Financial Resource Strain:   . Difficulty of Paying Living Expenses:   Food Insecurity:   . Worried About Sales executive in the Last Year:   . Arboriculturist in the Last Year:   Transportation Needs:   . Film/video editor (Medical):   Marland Kitchen Lack of Transportation (Non-Medical):   Physical Activity:   . Days of Exercise per Week:   . Minutes of Exercise per Session:   Stress:   . Feeling of Stress :   Social Connections:   . Frequency of Communication with Friends and Family:   . Frequency of Social Gatherings with Friends and Family:   . Attends Religious Services:   . Active Member of Clubs or Organizations:   . Attends Archivist Meetings:   Marland Kitchen Marital Status:   Intimate Partner Violence:   . Fear of Current or Ex-Partner:   . Emotionally Abused:   Marland Kitchen Physically Abused:   . Sexually Abused:     Observations/Objective:   Height 5' 10"  (1.778 m), weight 186 lb (84.4 kg). No acute distress.  Alert and oriented.  Speech fluent and not dysarthric.  Language intact.  Eyes orthophoric on primary gaze.  Face symmetric.  Assessment and Plan:   1.  Migraine without aura, without status migrainosus, not intractable 2.  Migraine with aura, without status migrainosus, not intractable 3.  Possible left sided trigeminal neuralgia, stable  1.  For preventative management, we will try to optimize treatment by increasing amitriptyline to 84m at bedtime.  He will continue Emgality monthly.  As he was originally on metoprolol for migraines, I will have him discontinue it, unless Dr. HYong Channelhas an objection.  We would have to monitor blood pressure. 2.  For abortive therapy, sumatriptan 3.  Limit use of pain relievers to no more than 2 days out of week to prevent risk of rebound or medication-overuse headache. 4.  Keep headache diary 5.  Exercise, hydration, caffeine cessation, sleep hygiene, monitor for and avoid triggers 6. Follow up 4 months.   Follow Up Instructions:    -I discussed the assessment and treatment plan with the patient. The patient was provided an opportunity to ask questions  and all were answered. The patient agreed with the plan and demonstrated an understanding of the instructions.   The patient was advised to call back or seek an in-person evaluation if the symptoms worsen or if the condition fails to improve as anticipated.    ADudley Major DO

## 2019-12-31 ENCOUNTER — Other Ambulatory Visit: Payer: Self-pay

## 2019-12-31 ENCOUNTER — Telehealth (INDEPENDENT_AMBULATORY_CARE_PROVIDER_SITE_OTHER): Payer: BC Managed Care – PPO | Admitting: Neurology

## 2019-12-31 VITALS — Ht 70.0 in | Wt 186.0 lb

## 2019-12-31 DIAGNOSIS — G43009 Migraine without aura, not intractable, without status migrainosus: Secondary | ICD-10-CM

## 2019-12-31 DIAGNOSIS — G500 Trigeminal neuralgia: Secondary | ICD-10-CM | POA: Diagnosis not present

## 2019-12-31 DIAGNOSIS — G43109 Migraine with aura, not intractable, without status migrainosus: Secondary | ICD-10-CM

## 2020-01-07 ENCOUNTER — Other Ambulatory Visit: Payer: Self-pay | Admitting: Family Medicine

## 2020-01-11 ENCOUNTER — Other Ambulatory Visit: Payer: Self-pay | Admitting: Neurology

## 2020-01-11 ENCOUNTER — Other Ambulatory Visit: Payer: Self-pay | Admitting: Family Medicine

## 2020-02-12 ENCOUNTER — Encounter: Payer: Self-pay | Admitting: Gastroenterology

## 2020-02-12 ENCOUNTER — Other Ambulatory Visit (INDEPENDENT_AMBULATORY_CARE_PROVIDER_SITE_OTHER): Payer: BC Managed Care – PPO

## 2020-02-12 ENCOUNTER — Other Ambulatory Visit: Payer: Self-pay

## 2020-02-12 DIAGNOSIS — R739 Hyperglycemia, unspecified: Secondary | ICD-10-CM

## 2020-02-12 DIAGNOSIS — Z125 Encounter for screening for malignant neoplasm of prostate: Secondary | ICD-10-CM

## 2020-02-12 DIAGNOSIS — Z0000 Encounter for general adult medical examination without abnormal findings: Secondary | ICD-10-CM

## 2020-02-12 DIAGNOSIS — E559 Vitamin D deficiency, unspecified: Secondary | ICD-10-CM | POA: Diagnosis not present

## 2020-02-12 DIAGNOSIS — E785 Hyperlipidemia, unspecified: Secondary | ICD-10-CM | POA: Diagnosis not present

## 2020-02-12 LAB — COMPREHENSIVE METABOLIC PANEL
ALT: 14 U/L (ref 0–53)
AST: 16 U/L (ref 0–37)
Albumin: 4.7 g/dL (ref 3.5–5.2)
Alkaline Phosphatase: 38 U/L — ABNORMAL LOW (ref 39–117)
BUN: 20 mg/dL (ref 6–23)
CO2: 34 mEq/L — ABNORMAL HIGH (ref 19–32)
Calcium: 10 mg/dL (ref 8.4–10.5)
Chloride: 96 mEq/L (ref 96–112)
Creatinine, Ser: 1.22 mg/dL (ref 0.40–1.50)
GFR: 61.16 mL/min (ref >60.00)
Glucose, Bld: 97 mg/dL (ref 70–99)
Potassium: 3.6 mEq/L (ref 3.5–5.1)
Sodium: 137 mEq/L (ref 135–145)
Total Bilirubin: 1.4 mg/dL — ABNORMAL HIGH (ref 0.2–1.2)
Total Protein: 7 g/dL (ref 6.0–8.3)

## 2020-02-12 LAB — LIPID PANEL
Cholesterol: 263 mg/dL — ABNORMAL HIGH (ref 0–200)
HDL: 60.3 mg/dL (ref >39.00)
LDL Cholesterol: 174 mg/dL — ABNORMAL HIGH (ref 0–99)
NonHDL: 202.21
Total CHOL/HDL Ratio: 4
Triglycerides: 140 mg/dL (ref 0.0–149.0)
VLDL: 28 mg/dL (ref 0.0–40.0)

## 2020-02-12 LAB — CBC WITH DIFFERENTIAL/PLATELET
Basophils Absolute: 0 10*3/uL (ref 0.0–0.1)
Basophils Relative: 0.8 % (ref 0.0–3.0)
Eosinophils Absolute: 0.1 10*3/uL (ref 0.0–0.7)
Eosinophils Relative: 1.3 % (ref 0.0–5.0)
HCT: 40.5 % (ref 39.0–52.0)
Hemoglobin: 14.2 g/dL (ref 13.0–17.0)
Lymphocytes Relative: 24.9 % (ref 12.0–46.0)
Lymphs Abs: 1.4 10*3/uL (ref 0.7–4.0)
MCHC: 35.1 g/dL (ref 30.0–36.0)
MCV: 84.8 fl (ref 78.0–100.0)
Monocytes Absolute: 0.7 10*3/uL (ref 0.1–1.0)
Monocytes Relative: 11.6 % (ref 3.0–12.0)
Neutro Abs: 3.5 10*3/uL (ref 1.4–7.7)
Neutrophils Relative %: 61.4 % (ref 43.0–77.0)
Platelets: 213 10*3/uL (ref 150.0–400.0)
RBC: 4.78 Mil/uL (ref 4.22–5.81)
RDW: 13.2 % (ref 11.5–15.5)
WBC: 5.7 10*3/uL (ref 4.0–10.5)

## 2020-02-12 LAB — VITAMIN D 25 HYDROXY (VIT D DEFICIENCY, FRACTURES): VITD: 47.75 ng/mL (ref 30.00–100.00)

## 2020-02-12 LAB — HEMOGLOBIN A1C: Hgb A1c MFr Bld: 4.9 % (ref 4.6–6.5)

## 2020-02-12 LAB — PSA: PSA: 0.3 ng/mL (ref 0.10–4.00)

## 2020-02-19 ENCOUNTER — Other Ambulatory Visit: Payer: Self-pay

## 2020-02-19 ENCOUNTER — Ambulatory Visit (INDEPENDENT_AMBULATORY_CARE_PROVIDER_SITE_OTHER): Payer: BC Managed Care – PPO | Admitting: Family Medicine

## 2020-02-19 ENCOUNTER — Encounter: Payer: Self-pay | Admitting: Family Medicine

## 2020-02-19 VITALS — BP 110/80 | HR 67 | Temp 97.3°F | Ht 71.0 in | Wt 193.4 lb

## 2020-02-19 DIAGNOSIS — R739 Hyperglycemia, unspecified: Secondary | ICD-10-CM

## 2020-02-19 DIAGNOSIS — F325 Major depressive disorder, single episode, in full remission: Secondary | ICD-10-CM

## 2020-02-19 DIAGNOSIS — Z0000 Encounter for general adult medical examination without abnormal findings: Secondary | ICD-10-CM | POA: Diagnosis not present

## 2020-02-19 DIAGNOSIS — K5150 Left sided colitis without complications: Secondary | ICD-10-CM | POA: Diagnosis not present

## 2020-02-19 DIAGNOSIS — Z86010 Personal history of colonic polyps: Secondary | ICD-10-CM

## 2020-02-19 DIAGNOSIS — I10 Essential (primary) hypertension: Secondary | ICD-10-CM

## 2020-02-19 DIAGNOSIS — E559 Vitamin D deficiency, unspecified: Secondary | ICD-10-CM

## 2020-02-19 DIAGNOSIS — E785 Hyperlipidemia, unspecified: Secondary | ICD-10-CM

## 2020-02-19 NOTE — Patient Instructions (Addendum)
Patient is going to reduce metoprolol to 12.5 mg (half tablet) twice daily for approximately a week-after that he can stop completely.  He has been to monitor blood pressure and heart rate and let me know approximately 2 weeks after the change of long he does not have any new significant symptoms   If not doing better on the back before weekend let me know and I can send in flexeril  Recommended follow up: Return in about 6 months (around 08/21/2020). if you need [place] sooner we can certainly check in

## 2020-02-19 NOTE — Progress Notes (Signed)
Phone: [phone]    I,[name],acting as a scribe for Roy Reddish, MD.,have documented all relevant documentation on the behalf of Roy Reddish, MD,as directed by  Roy Reddish, MD while in the presence of Roy Reddish, MD.  Subjective:  Patient presents today for their annual physical. Chief complaint-noted.   See problem oriented charting- ROS- full  review of systems was completed and negative  except for: some low back pain after starting back with trainer and doing some gardening recently- doing icing, flexeril with some improvement- also using voltaren  The following were reviewed and entered/updated in epic: Past Medical History:  Diagnosis Date  . Allergic rhinitis   . Allergy   . Arthritis   . GERD (gastroesophageal reflux disease)   . Gilbert syndrome   . Heart murmur    tricuspid valve regurgitation  . Internal hemorrhoids   . Migraines   . Panic attack    occasional  . Syncope    on airplane in 12/2016/ due to stress  . 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 Depression, major, single episode, moderate ([place]) 08/16/2018    Priority: High  . ULCERATIVE COLITIS-LEFT SIDE.  Follows with Dr. Fuller [facility] 05/06/2008    Priority: High  . Essential hypertension 03/12/2019    Priority: Medium  . 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 History of adenomatous polyp of colon 05/22/2017    Priority: Medium  . 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 Hyperlipidemia 05/15/2007    Priority: Medium  . Migraines 05/03/2007    Priority: Medium  . Hyperglycemia 01/26/2018    Priority: Low  . Vasovagal syncope 02/14/2017    Priority: Low  . Hearing loss 08/04/2016    Priority: Low  . Low back pain 02/09/2016    Priority: Low  . DOE (dyspnea on exertion) 04/15/2014    Priority: Low  . 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  Vitamin D deficiency 10/27/2009    Priority: Low  . GERD 01/31/2008    Priority: Low  . INTERNAL HEMORRHOIDS 01/23/2008    Priority: Low  . Allergic rhinitis 05/03/2007    Priority: Low  . Raynaud's disease 11/15/2019  . Family history of alpha 1 antitrypsin deficiency 06/14/2018   Past Surgical History:  Procedure Laterality Date  . ADENOIDECTOMY     as a child  . COLONOSCOPY    . 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 UPPER GASTROINTESTINAL ENDOSCOPY      Family History  Problem Relation Age of Onset  . Thyroid disease Father   . Heart disease Father        a fib, expanded aorta  . Arthritis Mother   . Colon cancer Other        Grandmother's Family (x 1 brother to grandmother and 1 sister to grandmother)  . Crohn's disease Paternal Aunt   . Crohn's disease Cousin   . Colon polyps Maternal Grandmother        Great Aunt, Great Uncle  . Heart disease Brother   . Colon polyps Brother   . Esophageal cancer Neg Hx   . Stomach cancer Neg Hx   . Rectal cancer Neg Hx     Medications- reviewed and updated Current Outpatient Medications  Medication Sig Dispense Refill  . amitriptyline (ELAVIL) 10 MG tablet Take  1 tablet (10 mg total) by mouth at bedtime. 90 tablet 1  . balsalazide (COLAZAL) 750 MG capsule TAKE 3 CAPSULES (=2250MG) 3TIMES A DAY. (Patient taking differently: Take 2,250 mg by mouth 3 (three) times daily. TAKE 3 CAPSULES (=2250MG) 3TIMES A DAY.) 810 capsule 1  . cetirizine (ZYRTEC) 10 MG tablet Take 10 mg by mouth daily.    . chlorthalidone (HYGROTON) 25 MG tablet TAKE 1 TABLET BY MOUTH EVERY DAY 90 tablet 0  . diphenhydrAMINE (BENADRYL) 25 MG tablet Take 25 mg by mouth at bedtime as needed.    Marland Kitchen EMGALITY 120 MG/ML SOAJ INJECT 120 MG INTO THE SKIN EVERY 30 (THIRTY) DAYS. 1 pen 11  . fluticasone (FLONASE) 50 MCG/ACT nasal spray USE 1 SPRAY IN EACH NOSTRILTWICE DAILY 48 g 3  . ibuprofen (ADVIL,MOTRIN) 200 MG tablet Take 200 mg by mouth as needed.        Marland Kitchen losartan (COZAAR) 50 MG tablet Take 1 tablet (50 mg total) by mouth daily. 90 tablet 3  . Melatonin 5 MG CAPS Take 1 capsule by mouth at bedtime.    . metoprolol tartrate (LOPRESSOR) 25 MG tablet TAKE 1 TABLET TWICE A DAY 180 tablet 1  . SUMAtriptan (IMITREX) 100 MG tablet TAKE 1 TABLET EVERY 2 HOURSAS NEEDED FOR MIGRAINE (2  TABLETS MAXIMUM PER DAY) 27 tablet 1  . Vitamin D, Cholecalciferol, 50 MCG (2000 UT) CAPS Take 1 capsule by mouth daily.    . mesalamine (CANASA) 1000 MG suppository Place 1 suppository (1,000 mg total) rectally at bedtime. (Patient not taking: Reported on 12/30/2019) 90 suppository 0   No current facility-administered medications for this visit.    Allergies-reviewed and updated Allergies  Allergen Reactions  . Shellfish Allergy Nausea And Vomiting    scallops  . Citrus     Oral pain, rash in mouth  . Codeine Nausea Only  . Dani Gobble [Fish Allergy] Nausea Only and Rash    Especially smoked salmon    Social History   Social History Narrative   Lives with husband [name] also a patient of Dr. Yong Channel). No pets or adopted children or natural children.       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       Hobbies: walking, hiking, reading, music, cooking , gardening.       Right handed.    Objective  Objective:  BP 110/80 (BP Location: Left Arm, Patient Position: Sitting, Cuff Size: Large)   Pulse 67   Temp (!) 97.3 F (36.3 C) (Temporal)   Ht 5' 11"  (1.803 m)   Wt 193 lb 6.1 oz (87.7 kg)   SpO2 99%   BMI 26.97 kg/m  Gen: NAD, resting comfortably HEENT: Mucous membranes are moist. Oropharynx normal Neck: no thyromegaly CV: RRR no murmurs rubs or gallops Lungs: CTAB no crackles, wheeze, rhonchi Abdomen: soft/nontender/nondistended/normal bowel sounds. No rebound or guarding.  Ext: trace edema left greater than right Skin: warm, dry Neuro: grossly normal, moves all extremities, PERRLA MSK: some muscle spasm in left low back  without midline pain. No leg weakness. No incontinence issues    Assessment and Plan  59 y.o. male presenting for annual physical.  Health Maintenance counseling: 1. Anticipatory guidance: Patient counseled regarding regular dental exams -q6 months, eye exams -yearly,  avoiding smoking and second hand smoke , limiting alcohol to 2 beverages per day -  Right at 2.   2. Risk factor reduction:  Advised patient of need for regular exercise and  diet rich and fruits and vegetables to reduce risk of heart attack and stroke. Exercise- recently restarted. Diet-weight gain he thinks is largely from decreased exercise- he has been eating healthy.  Wt Readings from Last 3 Encounters:  02/19/20 193 lb 6.1 oz (87.7 kg)  12/30/19 186 lb (84.4 kg)  11/15/19 186 lb (84.4 kg)  3. Immunizations/screenings/ancillary studies- fullly up to date.  Immunization History  Administered Date(s) Administered  . Influenza Whole 07/31/2009, 06/16/2010  . Influenza,inj,Quad PF,6+ Mos 06/30/2015, 05/29/2019  . Influenza-Unspecified 07/21/2016, 07/10/2017, 07/17/2018, 05/29/2019  . PFIZER SARS-COV-2 Vaccination 12/14/2019, 01/04/2020  . Td 05/09/2006  . Tdap 11/01/2013  . Zoster Recombinat (Shingrix) 10/15/2018, 03/12/2019  4. Prostate cancer screening-low risk for prostate cancer on PSA-continue to monitor yearly at least 370 Lab Results  Component Value Date   PSA 0.30 02/12/2020   PSA 0.39 10/15/2018   PSA 0.33 08/17/2017   5. Colon cancer screening - scheduled for Colonoscopy 05/06/2020. History of polyps in past plus UC 6. Skin cancer screening- sees dermatology. advised regular sunscreen use. Denies worrisome, changing, or new skin lesions.  7. never smoker  Status of chronic or acute concerns    Essential hypertension- good control but with some orthostatic symptoms-see below for change in metoprolol plan  -recent BPs from low 100s to 038 and diastolic from 69 to 93 but averaging under 90. Mild orthostatic  symptoms with laying to standing and with exercise.  - chlorthalidone worsens raynauds - amlodipine causes some increased edema from his baseline - see last note- right now he is off amlodipine but next winter he will titrate back on as we had planned at last visit in febrruary - considered reducing to 12.11m chlorthalidone but see migraine section- going to titrate off metoprolol  Migraines - Dr. JTomi Likensdoes not think he needs this from a migraine benefit -Patient is going to reduce metoprolol to 12.5 mg twice daily for approximately a week-after that he can stop completely.  He has been to monitor blood pressure and heart rate and let me know approximately 2 weeks after the change of long he does not have any new significant symptoms  Edema left worse than right -long term issues since his 256s- also has some loss of hair - worries about PAD (no claudication), venous insufficiency- certainly possible - advised compression stockings and continue to monitor  GERD - famotidine at night works pretty well but occasionally uses tums  hyperlipidemia S: Medication:none. Restarted at the gym recently.   Lab Results  Component Value Date   CHOL 263 (H) 02/12/2020   HDL 60.30 02/12/2020   LDLCALC 174 (H) 02/12/2020   LDLDIRECT 148.0 10/15/2018   TRIG 140.0 02/12/2020   CHOLHDL 4 02/12/2020   A/P: 10-year ASCVD risk only at 6.9%-would not recommend cholesterol medicine at this point.  He has recently restarted at the gym I think that would be beneficial.  He will continue to work on healthy eating/slight weight loss. Could cut down on alcohol for weight loss/reducing empty calories  ULCERATIVE COLITIS-LEFT SIDE.  [facility]. Updated colonoscopy in July. On balsalazide without flares and rare mesalamine  Depression remains in full remission- continue without meds- was able to wean off lexapro tahknfully.   Hyperglycemia- completely normal a1c- likely has higher  morning sugars but then active in the day and #s come down- though last non fasting sugar was normal at 97 Lab Results  Component Value Date   HGBA1C 4.9 02/12/2020  Vitamin D deficiency  - vitamin D in healthy range- continue 2000 units daily  See ROS- back pain he is working through- will let us know if e needs further assistance. He has had improvement in the past with flexeril. He will kep an eye on this and let me know if he needs a refill I am happy to provide flexeril 5 mg up to TID- usually avoid driving 8 hours after taking.   Recommended follow up: Return in about 6 months (around 08/21/2020) for follow up- or sooner if needed. Future Appointments  Date Time Provider [place]  04/22/2020  2:30 PM [facility] PREVISIT RM 51 [facility]  04/30/2020  8:30 AM Pieter Partridge, DO [facility] None  05/06/2020  9:00 AM Ladene Artist, MD [facility]   Lab/Order associations: Labs were done last week   ICD-10-CM   1. Preventative health care  Z00.00   2. ULCERATIVE COLITIS-LEFT SIDE.  Follows with Dr. Fuller [facility]  K51.50   3. Major depressive disorder with single episode, in full remission ([place])  F32.5   4. Hyperlipidemia, unspecified hyperlipidemia type  E78.5   5. Hyperglycemia  R73.9   6. History of adenomatous polyp of colon  Z86.010   7. Essential hypertension  I10   8. Vitamin D deficiency  E55.9    The entirety of the information documented in the History of Present Illness, Review of Systems and Physical Exam were personally obtained by me. Portions of this information were initially documented by the CMA and reviewed by me for thoroughness and accuracy.   Roy Reddish, MD  Return precautions advised.  Roy Reddish, MD

## 2020-03-13 ENCOUNTER — Other Ambulatory Visit: Payer: Self-pay | Admitting: Family Medicine

## 2020-03-19 ENCOUNTER — Encounter: Payer: Self-pay | Admitting: Family Medicine

## 2020-03-27 ENCOUNTER — Other Ambulatory Visit: Payer: Self-pay | Admitting: Family Medicine

## 2020-04-17 ENCOUNTER — Encounter: Payer: Self-pay | Admitting: Family Medicine

## 2020-04-17 DIAGNOSIS — R002 Palpitations: Secondary | ICD-10-CM

## 2020-04-22 ENCOUNTER — Other Ambulatory Visit: Payer: Self-pay

## 2020-04-22 ENCOUNTER — Telehealth: Payer: Self-pay

## 2020-04-22 ENCOUNTER — Ambulatory Visit (AMBULATORY_SURGERY_CENTER): Payer: Self-pay

## 2020-04-22 VITALS — Ht 71.0 in | Wt 190.8 lb

## 2020-04-22 DIAGNOSIS — K5150 Left sided colitis without complications: Secondary | ICD-10-CM

## 2020-04-22 DIAGNOSIS — Z86010 Personal history of colonic polyps: Secondary | ICD-10-CM

## 2020-04-22 MED ORDER — SUTAB 1479-225-188 MG PO TABS: 12.0000 | ORAL_TABLET | ORAL | 0 refills | Status: DC

## 2020-04-22 NOTE — Telephone Encounter (Signed)
Pt had previsit today and noted he will start wearing Holter monitor x 3 days starting 7/15.  Since stopping beta blocker has been having palpitations.  PCP ordered this for pt.    Pt states he will f/up with let [facility] know the outcome.    If not word, [facility] needs to follow up with him.    Procedure is schedule for 05/29/20

## 2020-04-22 NOTE — Progress Notes (Signed)
No allergies to soy or egg Pt is not on blood thinners or diet pills Denies issues with sedation/intubation Denies atrial flutter/fib Denies constipation      Pt is aware of Covid safety and care partner requirements.  Stopped

## 2020-04-23 ENCOUNTER — Ambulatory Visit (INDEPENDENT_AMBULATORY_CARE_PROVIDER_SITE_OTHER): Payer: BC Managed Care – PPO

## 2020-04-23 DIAGNOSIS — R002 Palpitations: Secondary | ICD-10-CM

## 2020-04-24 ENCOUNTER — Encounter: Payer: Self-pay | Admitting: Gastroenterology

## 2020-04-30 ENCOUNTER — Ambulatory Visit: Payer: BC Managed Care – PPO | Admitting: Neurology

## 2020-05-04 ENCOUNTER — Encounter: Payer: Self-pay | Admitting: Family Medicine

## 2020-05-04 NOTE — Telephone Encounter (Signed)
Patient is returning your call said he will be available after 4pm

## 2020-05-04 NOTE — Telephone Encounter (Signed)
Pt is aware that ok to proceed

## 2020-05-04 NOTE — Telephone Encounter (Signed)
I noted the outcome of the halter monitor- no abnormal rhythms per Dr. Yong Channel.  Dr. Yong Channel did not recommend any follow up with cardiology of any kind right now

## 2020-05-06 ENCOUNTER — Encounter: Payer: BC Managed Care – PPO | Admitting: Gastroenterology

## 2020-05-21 ENCOUNTER — Other Ambulatory Visit: Payer: Self-pay | Admitting: Neurology

## 2020-05-29 ENCOUNTER — Encounter: Payer: Self-pay | Admitting: Family Medicine

## 2020-05-29 ENCOUNTER — Other Ambulatory Visit: Payer: Self-pay

## 2020-05-29 ENCOUNTER — Encounter: Payer: Self-pay | Admitting: Gastroenterology

## 2020-05-29 ENCOUNTER — Ambulatory Visit (AMBULATORY_SURGERY_CENTER): Payer: BC Managed Care – PPO | Admitting: Gastroenterology

## 2020-05-29 VITALS — BP 125/90 | HR 75 | Temp 95.7°F | Resp 15 | Ht 71.0 in | Wt 190.8 lb

## 2020-05-29 DIAGNOSIS — K635 Polyp of colon: Secondary | ICD-10-CM | POA: Diagnosis not present

## 2020-05-29 DIAGNOSIS — K5130 Ulcerative (chronic) rectosigmoiditis without complications: Secondary | ICD-10-CM

## 2020-05-29 DIAGNOSIS — Z86010 Personal history of colonic polyps: Secondary | ICD-10-CM | POA: Diagnosis not present

## 2020-05-29 DIAGNOSIS — D122 Benign neoplasm of ascending colon: Secondary | ICD-10-CM

## 2020-05-29 MED ORDER — SODIUM CHLORIDE 0.9 % IV SOLN: 500.0000 mL | Freq: Once | INTRAVENOUS | Status: DC

## 2020-05-29 NOTE — Progress Notes (Signed)
Pt's states no medical or surgical changes since previsit or office visit.   [place]

## 2020-05-29 NOTE — Patient Instructions (Signed)
YOU HAD AN ENDOSCOPIC PROCEDURE TODAY AT [facility]:   Refer to the procedure report that was given to you for any specific questions about what was found during the examination.  If the procedure report does not answer your questions, please call your gastroenterologist to clarify.  If you requested that your care partner not be given the details of your procedure findings, then the procedure report has been included in a sealed envelope for you to review at your convenience later.  YOU SHOULD EXPECT: Some feelings of bloating in the abdomen. Passage of more gas than usual.  Walking can help get rid of the air that was put into your GI tract during the procedure and reduce the bloating. If you had a lower endoscopy (such as a colonoscopy or flexible sigmoidoscopy) you may notice spotting of blood in your stool or on the toilet paper. If you underwent a bowel prep for your procedure, you may not have a normal bowel movement for a few days.  Please Note:  You might notice some irritation and congestion in your nose or some drainage.  This is from the oxygen used during your procedure.  There is no need for concern and it should clear up in a day or so.  SYMPTOMS TO REPORT IMMEDIATELY:   Following lower endoscopy (colonoscopy or flexible sigmoidoscopy):  Excessive amounts of blood in the stool  Significant tenderness or worsening of abdominal pains  Swelling of the abdomen that is new, acute  Fever of 100F or higher   For urgent or emergent issues, a gastroenterologist can be reached at any hour by calling [phone]. Do not use MyChart messaging for urgent concerns.    DIET:  We do recommend a small meal at first, but then you may proceed to your regular diet.  Drink plenty of fluids but you should avoid alcoholic beverages for 24 hours.  MEDICATIONS: Continue present medications.  Please see handouts given to you by your recovery nurse.  ACTIVITY:  You should plan to  take it easy for the rest of today and you should NOT DRIVE or use heavy machinery until tomorrow (because of the sedation medicines used during the test).    FOLLOW UP: Our staff will call the number listed on your records 48-72 hours following your procedure to check on you and address any questions or concerns that you may have regarding the information given to you following your procedure. If we do not reach you, we will leave a message.  We will attempt to reach you two times.  During this call, we will ask if you have developed any symptoms of COVID 19. If you develop any symptoms (ie: fever, flu-like symptoms, shortness of breath, cough etc.) before then, please call [phone].  If you test positive for Covid 19 in the 2 weeks post procedure, please call and report this information to [place].    If any biopsies were taken you will be contacted by phone or by letter within the next 1-3 weeks.  Please call us at [phone] if you have not heard about the biopsies in 3 weeks.   Thank you for allowing [place] to provide for your healthcare needs today.   SIGNATURES/CONFIDENTIALITY: You and/or your care partner have signed paperwork which will be entered into your electronic medical record.  These signatures attest to the fact that that the information above on your After Visit Summary has been reviewed and is understood.  Full responsibility of the  confidentiality of this discharge information lies with you and/or your care-partner.

## 2020-05-29 NOTE — Op Note (Addendum)
[place] Patient Name: [name] Procedure Date: 05/29/2020 11:35 AM MRN: [id number] Endoscopist: Ladene Artist , MD Age: 59 Referring MD:  Date of Birth: 10/22/1960 Gender: Male Account #: [id number] Procedure:                Colonoscopy Indications:              High risk colon cancer surveillance: Ulcerative                            left sided colitis of 8 (or more) years duration.                            Personal history of adenomatous colon polyps. Medicines:                Monitored Anesthesia Care Procedure:                Pre-Anesthesia Assessment:                           - Prior to the procedure, a History and Physical                            was performed, and patient medications and                            allergies were reviewed. The patient's tolerance of                            previous anesthesia was also reviewed. The risks                            and benefits of the procedure and the sedation                            options and risks were discussed with the patient.                            All questions were answered, and informed consent                            was obtained. Prior Anticoagulants: The patient has                            taken no previous anticoagulant or antiplatelet                            agents. ASA Grade Assessment: II - A patient with                            mild systemic disease. After reviewing the risks                            and benefits, the patient was deemed in  satisfactory condition to undergo the procedure.                           After obtaining informed consent, the colonoscope                            was passed under direct vision. Throughout the                            procedure, the patient's blood pressure, pulse, and                            oxygen saturations were monitored continuously. The                            Colonoscope was  introduced through the anus and                            advanced to the the cecum, identified by                            appendiceal orifice and ileocecal valve. The                            ileocecal valve, appendiceal orifice, and rectum                            were photographed. The quality of the bowel                            preparation was good. The colonoscopy was somewhat                            difficult due to significant looping and a tortuous                            colon. Successful completion of the procedure was                            aided by straightening and shortening the scope to                            obtain bowel loop reduction. The patient tolerated                            the procedure well. Scope In: 11:42:13 AM Scope Out: 12:04:50 PM Scope Withdrawal Time: 0 hours 18 minutes 3 seconds  Total Procedure Duration: 0 hours 22 minutes 37 seconds  Findings:                 The perianal and digital rectal examinations were                            normal.  A 12 mm polyp was found in the ascending colon. The                            polyp was sessile. The polyp was removed with a                            cold snare. Resection and retrieval were complete.                           Inflammation characterized by altered vascularity                            and erythema was found. This was mild in severity,                            and when compared to previous examinations, the                            findings are unchanged. Biopsies were taken with a                            cold forceps for histology.                           The exam was otherwise without abnormality on                            direct and retroflexion views. Random biopsies                            obtained. Complications:            No immediate complications. Estimated blood loss:                            None. Estimated  Blood Loss:     Estimated blood loss: none. Impression:               - One 12 mm polyp in the ascending colon, removed                            with a cold snare. Resected and retrieved.                           - Proctitis, mild. Biopsied.                           - The examination was otherwise normal on direct                            and retroflexion views. Recommendation:           - Repeat colonoscopy after studies are complete for                            surveillance,  likley 3 years, based on pathology                            results.                           - Patient has a contact number available for                            emergencies. The signs and symptoms of potential                            delayed complications were discussed with the                            patient. Return to normal activities tomorrow.                            Written discharge instructions were provided to the                            patient.                           - Resume previous diet.                           - Continue present medications.                           - Await pathology results. Ladene Artist, MD 05/29/2020 12:11:33 PM This report has been signed electronically.

## 2020-05-29 NOTE — Progress Notes (Signed)
Pt. C/o abd discomfort upon arriving in recovery, VSS, abd distended, encouraged patient to pass air, adm Levsin 0.56m SL, turned, repositioned, up on hands and knees with good release of air, up to BR, ambulated around nursing station X3, passing air with some relief of discomfort. Patient states his discomfort has progress from "painful" to "uncomfortable", Dr. SFuller Plannotified and patient is comfortable with discharge to home.

## 2020-05-29 NOTE — Progress Notes (Signed)
Report to PACU, RN, vss, BBS= Clear.  

## 2020-05-29 NOTE — Progress Notes (Signed)
Called to room to assist during endoscopic procedure.  Patient ID and intended procedure confirmed with present staff. Received instructions for my participation in the procedure from the performing physician.  

## 2020-05-31 ENCOUNTER — Other Ambulatory Visit: Payer: Self-pay

## 2020-05-31 MED ORDER — SUMATRIPTAN SUCCINATE 100 MG PO TABS: ORAL_TABLET | ORAL | 1 refills | Status: DC

## 2020-06-02 ENCOUNTER — Telehealth: Payer: Self-pay

## 2020-06-02 NOTE — Telephone Encounter (Signed)
  Follow up Call-  Call back number 05/29/2020  Post procedure Call Back phone  # [phone]  Permission to leave phone message Yes  Some recent data might be hidden     Patient questions:  Do you have a fever, pain , or abdominal swelling? No. Pain Score  0 *  Have you tolerated food without any problems? Yes.    Have you been able to return to your normal activities? Yes.    Do you have any questions about your discharge instructions: Diet   No. Medications  No. Follow up visit  No.  Do you have questions or concerns about your Care? No.  Actions: * If pain score is 4 or above: No action needed, pain <4.  Have you developed a fever since your procedure? No 2.   Have you had an respiratory symptoms (SOB or cough) since your procedure? No 3.   Have you tested positive for COVID 19 since your procedure No 4.   Have you had any family members/close contacts diagnosed with the COVID 19 since your procedure?  No   If yes to any of these questions please route to Joylene John, RN and Joella Prince, RN

## 2020-06-08 ENCOUNTER — Encounter: Payer: Self-pay | Admitting: Gastroenterology

## 2020-06-25 ENCOUNTER — Other Ambulatory Visit: Payer: Self-pay | Admitting: Family Medicine

## 2020-07-01 ENCOUNTER — Encounter: Payer: Self-pay | Admitting: Family Medicine

## 2020-07-02 MED ORDER — PROPRANOLOL HCL ER 60 MG PO CP24: 60.0000 mg | ORAL_CAPSULE | Freq: Every day | ORAL | 5 refills | Status: DC

## 2020-07-02 NOTE — Progress Notes (Signed)
NEUROLOGY FOLLOW UP OFFICE NOTE  [name] [id number]  HISTORY OF PRESENT ILLNESS: [name] is a 59 year old Caucasian man with depression, anxiety and Gilbert syndrome whofollows up for migraines and possible left-sided trigeminal neuralgia.  UPDATE: Increased amitriptyline last visit to 2m at bedtime.  No real improvement in headaches.  He was experiencing sinus tachycardia and was prescribed propranolol by Dr. HYong Channel  He will be starting today.  Migraines are less intense and not much prodrome.  They tend to occur often in middle of night or late morning.  Intensity:Moderate (no severe) Duration:1 hour.   Frequency:7 days a month (often occur in clusters such as 3 in a row)  Current NSAIDS:none Current analgesics:Sumatriptan 561mCurrent triptans:none Current ergotamine:none Current anti-emetic:Promethazine 12.5 mg Current muscle relaxants:none Current anti-anxiolytic:none Current sleep aide:melatonin Current Antihypertensive medications:; losartan Current Antidepressant medications:none Current Anticonvulsant medications:none Current anti-CGRP:Emgality Current Vitamins/Herbal/Supplements:Vitamin D, melatonin Current Antihistamines/Decongestants:Benadryl, Zyrtec Other therapy:none Other medications:none  Caffeine:1 mug of coffee daily Diet:Drinks water Exercise:yes Depression:yes; Anxiety:yes Other pain:Muscle aches Sleep hygiene:Poor.  HISTORY: Onset: Childhoodand then since late 20s Location:Unilateral (either side) left behind eye radiating to back of head Quality:pressure InitialIntensity:7/10.Hedenies new headache, thunderclap headache.  Sometimes wakes up with them. Aura:On rare occasions had jagged lines in vision Premonitory Phase:Mild numb sensation, flushing of ear on side of headache Postdrome:A day he has a fatigue sensation Associated symptoms: Photophobia, phonophobia,  osmophobia, nausea. Hedenies associated unilateral numbness or weakness. InitialDuration:5-6 hours (if takes sumatriptan immediately, then 1 hour) InitialFrequency:10 to 15 days InitialFrequency of abortive medication:at least 10 days a month Triggers: Some strong smells, maybe chocolate or red wine, often after a stressful event. Relieving factors: Sumatriptan Activity:aggravates  In March2020, he had a new migraine associated with aura. 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 He has had one subsequent event.  In October-November 2020, He had an episode of possible left-sided trigeminal neuralgia.  He described a spasm on side of his left eye radiating to the cheek. It was an electric shock. It lasted a second and occurred 3 times. Eye exam was fine.   Past NSAIDS:Ibuprofen, naproxen Past analgesics:Tylenol Past abortive triptans:rizatriptan, eletriptan? Past abortive ergotamine:none Past muscle relaxants:none Past anti-emetic:none Past antihypertensive medications:metoprolol Past antidepressant medications:doxepin, Lexapro Past anticonvulsant medications:none Past anti-CGRP:Nurtec(effective but comparable to sumatriptan in efficacy) Past vitamins/Herbal/Supplements:none Past antihistamines/decongestants:none Other past therapies:none  Family history of headache:Father, niece  PAST MEDICAL HISTORY: Past Medical History:  Diagnosis Date  . Allergic rhinitis   . Allergy    seasonal  . Arthritis   . GERD (gastroesophageal reflux disease)   . Gilbert syndrome   . Heart murmur    tricuspid valve regurgitation  . Hyperlipidemia   . Hypertension   . Internal hemorrhoids   . Migraines   . Palpitations   . Panic attack    occasional  . Syncope    on airplane in 12/2016/ due to stress  . [place]itis [place]    MEDICATIONS: Current Outpatient  Medications on File Prior to Visit  Medication Sig Dispense Refill  . amitriptyline (ELAVIL) 10 MG tablet TAKE 1 TABLET AT BEDTIME 90 tablet 1  . balsalazide (COLAZAL) 750 MG capsule TAKE 3 CAPSULES (=2250MG) 3TIMES A DAY. 810 capsule 1  . cetirizine (ZYRTEC) 10 MG tablet Take 10 mg by mouth daily.    . chlorthalidone (HYGROTON) 25 MG tablet TAKE 1 TABLET BY MOUTH EVERY DAY 90 tablet 3  . diphenhydrAMINE (BENADRYL) 25 MG tablet Take  25 mg by mouth at bedtime as needed.    Marland Kitchen EMGALITY 120 MG/ML SOAJ INJECT 120 MG INTO THE SKIN EVERY 30 (THIRTY) DAYS. 1 pen 11  . Famotidine (PEPCID PO) Take by mouth.    . fluticasone (FLONASE) 50 MCG/ACT nasal spray USE 1 SPRAY IN EACH NOSTRILTWICE DAILY 48 g 3  . ibuprofen (ADVIL,MOTRIN) 200 MG tablet Take 200 mg by mouth as needed.   (Patient not taking: Reported on 05/29/2020)    . losartan (COZAAR) 50 MG tablet TAKE 1 TABLET BY MOUTH EVERY DAY 90 tablet 3  . Melatonin 5 MG CAPS Take 1 capsule by mouth at bedtime.    . mesalamine (CANASA) 1000 MG suppository Place 1 suppository (1,000 mg total) rectally at bedtime. (Patient not taking: Reported on 12/30/2019) 90 suppository 0  . SUMAtriptan (IMITREX) 100 MG tablet May repeat in 2 hours if headache persists or recurs. 27 tablet 1  . Vitamin D, Cholecalciferol, 50 MCG (2000 UT) CAPS Take 1 capsule by mouth daily.    . [DISCONTINUED] glycopyrrolate (ROBINUL) 2 MG tablet Take 2 mg by mouth 2 (two) times daily.       Current Facility-Administered Medications on File Prior to Visit  Medication Dose Route Frequency Provider Last Rate Last Admin  . 0.9 %  sodium chloride infusion  500 mL Intravenous Once Ladene Artist, MD        ALLERGIES: Allergies  Allergen Reactions  . Shellfish Allergy Nausea And Vomiting    scallops  . Citrus     Oral pain, rash in mouth  . Codeine Nausea Only  . Salmon [Fish Allergy] Nausea Only and Rash    Especially smoked salmon    FAMILY HISTORY: Family History  Problem  Relation Age of Onset  . Thyroid disease Father   . Heart disease Father        a fib, expanded aorta  . Arthritis Mother   . Colon cancer Other        Grandmother's Family (x 1 brother to grandmother and 1 sister to grandmother)  . Crohn's disease Paternal Aunt   . Crohn's disease Cousin   . Colon polyps Maternal Grandmother        Great Aunt, Great Uncle  . Heart disease Brother   . Colon polyps Brother   . Esophageal cancer Neg Hx   . Stomach cancer Neg Hx   . Rectal cancer Neg Hx     SOCIAL HISTORY: Social History   Socioeconomic History  . Marital status: Married    Spouse name: Domestic partner  . Number of children: 0  . Years of education: Not on file  . Highest education level: Doctorate  Occupational History  . Occupation: Secretary/administrator professor    Employer: [employer]  Tobacco Use  . Smoking status: Never Smoker  . Smokeless tobacco: Never Used  Vaping Use  . Vaping Use: Never used  Substance and Sexual Activity  . Alcohol use: Yes    Comment: 2 a day/14 a week  . Drug use: No  . Sexual activity: Not on file  Other Topics Concern  . Not on file  Social History Narrative   Lives with husband Dwaine Deter also a patient of Dr. Yong Channel). No pets or adopted children or natural children.       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       Hobbies: walking, hiking, reading, music, cooking , gardening.       Right handed.  Social Determinants of Health   Financial Resource Strain:   . Difficulty of Paying Living Expenses: Not on file  Food Insecurity:   . Worried About Charity fundraiser in the Last Year: Not on file  . Ran Out of Food in the Last Year: Not on file  Transportation Needs:   . Lack of Transportation (Medical): Not on file  . Lack of Transportation (Non-Medical): Not on file  Physical Activity:   . Days of Exercise per Week: Not on file  . Minutes of Exercise per Session: Not on file  Stress:   . Feeling of Stress : Not on  file  Social Connections:   . Frequency of Communication with Friends and Family: Not on file  . Frequency of Social Gatherings with Friends and Family: Not on file  . Attends Religious Services: Not on file  . Active Member of Clubs or Organizations: Not on file  . Attends Archivist Meetings: Not on file  . Marital Status: Not on file  Intimate Partner Violence:   . Fear of Current or Ex-Partner: Not on file  . Emotionally Abused: Not on file  . Physically Abused: Not on file  . Sexually Abused: Not on file    PHYSICAL EXAM: Blood pressure 128/81, pulse 88, resp. rate 18, height 5' 10"  (1.778 m), weight 187 lb (84.8 kg), SpO2 98 %. General: No acute distress.  Patient appears well-groomed.   Head:  Normocephalic/atraumatic Eyes:  Fundi examined but not visualized Neck: supple, no paraspinal tenderness, full range of motion Heart:  Regular rate and rhythm Lungs:  Clear to auscultation bilaterally Back: No paraspinal tenderness Neurological Exam: alert and oriented to person, place, and time. Attention span and concentration intact, recent and remote memory intact, fund of knowledge intact.  Speech fluent and not dysarthric, language intact.  CN II-XII intact. Bulk and tone normal, muscle strength 5/5 throughout.  Sensation to light touch, temperature and vibration intact.  Deep tendon reflexes 2+ throughout, toes downgoing.  Finger to nose and heel to shin testing intact.  Gait normal, Romberg negative.  IMPRESSION: Migraine without aura, without status migrainosus, not intractable  PLAN: 1.  Emgality.  He will be starting propranolol ER 61m daily, so will see how he does. 2.  As amitriptyline not too effective for migraines, will decrease back to 183m(won't discontinue as it does help him sleep). 3.  Sumatriptan as needed. 4.  Limit use of pain relievers to no more than 2 days out of week to prevent risk of rebound or medication-overuse headache. 5.  Keep headache  diary 6.  Follow up in 6 months.  AdMetta ClinesDO  CC: StGarret ReddishMD

## 2020-07-03 ENCOUNTER — Other Ambulatory Visit: Payer: Self-pay

## 2020-07-03 ENCOUNTER — Encounter: Payer: Self-pay | Admitting: Neurology

## 2020-07-03 ENCOUNTER — Ambulatory Visit: Payer: BC Managed Care – PPO | Admitting: Neurology

## 2020-07-03 VITALS — BP 128/81 | HR 88 | Resp 18 | Ht 70.0 in | Wt 187.0 lb

## 2020-07-03 DIAGNOSIS — G43009 Migraine without aura, not intractable, without status migrainosus: Secondary | ICD-10-CM

## 2020-07-03 MED ORDER — EMGALITY 120 MG/ML ~~LOC~~ SOAJ: 120.0000 mg | SUBCUTANEOUS | 0 refills | Status: DC

## 2020-07-03 NOTE — Patient Instructions (Signed)
1.  Continue Emgality.  Sent a 90 day supply 2.  Reduce amitriptyline back to 32m at bedtime 3.  We will see how you do on propranolol 4.  Sumatriptan as needed. 5.  Limit use of pain relievers to no more than 2 days out of week to prevent risk of rebound or medication-overuse headache. 6.  Keep headache diary 7.  Follow up in 6 months.

## 2020-08-07 ENCOUNTER — Encounter: Payer: Self-pay | Admitting: Family Medicine

## 2020-08-23 NOTE — Progress Notes (Addendum)
Phone [phone] In person visit   Subjective:   [name] is a 59 y.o. year old very pleasant male patient who presents for/with See problem oriented charting Chief Complaint  Patient presents with  . Hyperlipidemia  . Hypertension  . Gastroesophageal Reflux   This visit occurred during the SARS-CoV-2 public health emergency.  Safety protocols were in place, including screening questions prior to the visit, additional usage of staff PPE, and extensive cleaning of exam room while observing appropriate contact time as indicated for disinfecting solutions.   Past Medical History-  Patient Active Problem List   Diagnosis Date Noted  . Depression, major, single episode, moderate ([place]) 08/16/2018    Priority: High  . ULCERATIVE COLITIS-LEFT SIDE.  Follows with Dr. [facility] 05/06/2008    Priority: High  . Essential hypertension 03/12/2019    Priority: Medium  . 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 History of adenomatous polyp of colon 05/22/2017    Priority: Medium  . 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 Hyperlipidemia 05/15/2007    Priority: Medium  . Migraines 05/03/2007    Priority: Medium  . Hyperglycemia 01/26/2018    Priority: Low  . Vasovagal syncope 02/14/2017    Priority: Low  . Hearing loss 08/04/2016    Priority: Low  . Low back pain 02/09/2016    Priority: Low  . DOE (dyspnea on exertion) 04/15/2014    Priority: Low  . 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 Vitamin D deficiency 10/27/2009    Priority: Low  . GERD 01/31/2008    Priority: Low  . INTERNAL HEMORRHOIDS 01/23/2008    Priority: Low  . Allergic rhinitis 05/03/2007    Priority: Low  . Raynaud's disease 11/15/2019  . Family history of alpha 1 antitrypsin deficiency 06/14/2018    Medications- reviewed and updated Current Outpatient Medications  Medication Sig Dispense Refill  . amitriptyline (ELAVIL) 10 MG tablet  TAKE 1 TABLET AT BEDTIME (Patient taking differently: Take 25 mg by mouth at bedtime. ) 90 tablet 1  . balsalazide (COLAZAL) 750 MG capsule TAKE 3 CAPSULES (=2250MG) 3TIMES A DAY. 810 capsule 1  . cetirizine (ZYRTEC) 10 MG tablet Take 10 mg by mouth daily.    . chlorthalidone (HYGROTON) 25 MG tablet TAKE 1 TABLET BY MOUTH EVERY DAY 90 tablet 3  . diphenhydrAMINE (BENADRYL) 25 MG tablet Take 25 mg by mouth at bedtime as needed.    . Famotidine (PEPCID PO) Take by mouth.    . fluticasone (FLONASE) 50 MCG/ACT nasal spray USE 1 SPRAY IN EACH NOSTRILTWICE DAILY 48 g 3  . Galcanezumab-gnlm (EMGALITY) 120 MG/ML SOAJ Inject 120 mg into the skin every 30 (thirty) days. 3.36 mL 0  . ibuprofen (ADVIL,MOTRIN) 200 MG tablet Take 200 mg by mouth as needed.      Marland Kitchen losartan (COZAAR) 50 MG tablet TAKE 1 TABLET BY MOUTH EVERY DAY 90 tablet 3  . Melatonin 5 MG CAPS Take 1 capsule by mouth at bedtime.    . mesalamine (CANASA) 1000 MG suppository Place 1 suppository (1,000 mg total) rectally at bedtime. 90 suppository 0  . propranolol ER (INDERAL LA) 60 MG 24 hr capsule Take 1 capsule (60 mg total) by mouth daily. 30 capsule 5  . SUMAtriptan (IMITREX) 100 MG tablet May repeat in 2 hours if headache persists or recurs. 27 tablet 1  . Vitamin D, Cholecalciferol, 50 MCG (2000 UT)  CAPS Take 1 capsule by mouth daily.     No current facility-administered medications for this visit.     Objective:  BP 124/76   Pulse 62   Temp 97.8 F (36.6 C) (Temporal)   Resp 18   Ht 5' 10"  (1.778 m)   Wt 189 lb 12.8 oz (86.1 kg)   SpO2 100%   BMI 27.23 kg/m  Gen: NAD, resting comfortably CV: RRR no murmurs rubs or gallops Lungs: CTAB no crackles, wheeze, rhonchi Abdomen: soft/nontender/nondistended/normal bowel sounds. No rebound or guarding.  Ext: trace edema on right leg, no edema on left Skin: warm, dry     Assessment and Plan   # social update- [place] and [place] for 6 weeks leaving on the 20th.  -a lot of stress  with mom/strained relationship. Luckily things are going with his dad at least.  - a friend down the street is very ill and that has been stressful for him  #hyperlipidemia S: Medication: None-ASCVD risk at 8.4% but he would prefer to work on diet and exercise to try to bring this down Lab Results  Component Value Date   CHOL 263 (H) 02/12/2020   HDL 60.30 02/12/2020   LDLCALC 174 (H) 02/12/2020   LDLDIRECT 148.0 10/15/2018   TRIG 140.0 02/12/2020   CHOLHDL 4 02/12/2020   A/P: mild poor control- discussed working on lifestyle changes - back at gym twice a week with trainer and half an hour with cardio - reasonably healthy diet and perhaps less stress eating that height of covid - 2 glasses of wine a day.   #hypertension S: medication: Chlorthalidone 25Mg (worsened since Raynaud's), losartan 50Mg, propanolol 60Mg preferred by Dr. Tomi Likens over metoprolol and started in September with heart rate elevations as well as higher BPs. Palpitations resolved for most part after restarting propranolol- had been more frequent off beta blocker. HR 60s and 70s. At gym peaks around 130s.  -Amlodipine in the past caused some edema. Home readings #s: mostly 120s/80s. Rare 30Q on diastolic. Lowest he has seen is 116- not a lot of orthostatic symptoms. Worried with winter months raynauds may worsen.  BP Readings from Last 3 Encounters:  08/24/20 124/76  07/03/20 128/81  05/29/20 125/90  A/P: Stable. Continue current medications.   # sweating episode S:2 days ago had been out raking leaves but not too tough. Was coming in and making the bed. Had an episode of nausea and profuse sweating. No chest pain or shortness of breath. HR was normal at that time.  A/P: I tend to think this was likely a panic related episode. If he has recurrent symptoms or chest pain or shortness of breath he will let us know. Has seen Dr. Debara Pickett at age 38 and was told likely panic attack  # GERD S:Medication: Famotidine occasionally  in evening A/P: reasonable control- continue current medications   #Migraines-doing well on Emgality- some worsening with recent stress.  Amitriptyline was considering reducing to 10 mg but then went back to 25 mg with poor sleep with mom relational stress. He may reach out for refill on either 10 or 25 mg- wants to see how he does through his trip.   # Depression S: Medication: Remains in remission off of Lexapro Depression screen [facility] 2/9 08/24/2020 02/19/2020 11/15/2019  Decreased Interest 0 0 0  Down, Depressed, Hopeless 0 0 0  PHQ - 2 Score 0 0 0  Altered sleeping 0 0 0  Tired, decreased energy 0 0 0  Change in  appetite 0 0 0  Feeling bad or failure about yourself  0 0 0  Trouble concentrating 0 0 0  Moving slowly or fidgety/restless 0 0 0  Suicidal thoughts 0 0 0  PHQ-9 Score 0 0 0  Difficult doing work/chores - Not difficult at all Not difficult at all  A/P: Excellent control/full remission-remain off medication  Recommended follow up: Return in about 6 months (around 02/21/2021) for physical or sooner if needed. Future Appointments  Date Time Provider [place]  01/08/2021 10:50 AM Pieter Partridge, DO [facility] None    Lab/Order associations: labs ordered for before physical   ICD-10-CM   1. Essential hypertension  I10 CBC With Differential/Platelet    COMPLETE METABOLIC PANEL WITH GFR    Lipid Panel w/reflex Direct LDL  2. Hyperlipidemia, unspecified hyperlipidemia type  E78.5 CBC With Differential/Platelet    COMPLETE METABOLIC PANEL WITH GFR    Lipid Panel w/reflex Direct LDL  3. Intractable migraine with aura without status migrainosus  G43.119   4. Gastroesophageal reflux disease without esophagitis  K21.9   5. Vitamin D deficiency  E55.9 VITAMIN D 25 Hydroxy (Vit-D Deficiency, Fractures)  6. Screening for prostate cancer  Z12.5 PSA   Return precautions advised.  [name], MD

## 2020-08-23 NOTE — Patient Instructions (Addendum)
No changes today  Continue to work on healthy eating/regular exercise- great job picking up on the exercise again!   Hope yall have a safe and wonderful trip

## 2020-08-24 ENCOUNTER — Other Ambulatory Visit: Payer: Self-pay

## 2020-08-24 ENCOUNTER — Encounter: Payer: Self-pay | Admitting: Family Medicine

## 2020-08-24 ENCOUNTER — Ambulatory Visit (INDEPENDENT_AMBULATORY_CARE_PROVIDER_SITE_OTHER): Payer: BC Managed Care – PPO | Admitting: Family Medicine

## 2020-08-24 VITALS — BP 124/76 | HR 62 | Temp 97.8°F | Resp 18 | Ht 70.0 in | Wt 189.8 lb

## 2020-08-24 DIAGNOSIS — I10 Essential (primary) hypertension: Secondary | ICD-10-CM | POA: Diagnosis not present

## 2020-08-24 DIAGNOSIS — Z125 Encounter for screening for malignant neoplasm of prostate: Secondary | ICD-10-CM

## 2020-08-24 DIAGNOSIS — G43119 Migraine with aura, intractable, without status migrainosus: Secondary | ICD-10-CM | POA: Diagnosis not present

## 2020-08-24 DIAGNOSIS — K219 Gastro-esophageal reflux disease without esophagitis: Secondary | ICD-10-CM | POA: Diagnosis not present

## 2020-08-24 DIAGNOSIS — E785 Hyperlipidemia, unspecified: Secondary | ICD-10-CM | POA: Diagnosis not present

## 2020-08-24 DIAGNOSIS — F321 Major depressive disorder, single episode, moderate: Secondary | ICD-10-CM

## 2020-08-24 DIAGNOSIS — E559 Vitamin D deficiency, unspecified: Secondary | ICD-10-CM

## 2020-08-24 DIAGNOSIS — R739 Hyperglycemia, unspecified: Secondary | ICD-10-CM

## 2020-08-24 NOTE — Addendum Note (Signed)
Addended by: Marin Olp on: 08/24/2020 09:22 AM   Modules accepted: Orders

## 2020-08-24 NOTE — Assessment & Plan Note (Signed)
S: medication: Chlorthalidone 25Mg (worsened since Raynaud's), losartan 50Mg, propanolol 60Mg preferred by Dr. Tomi Likens over metoprolol and started in September with heart rate elevations as well as higher BPs. Palpitations resolved for most part after restarting propranolol- had been more frequent off beta blocker. HR 60s and 70s. At gym peaks around 130s.  -Amlodipine in the past caused some edema. Home readings #s: mostly 120s/80s. Rare 47X on diastolic. Lowest he has seen is 116- not a lot of orthostatic symptoms. Worried with winter months raynauds may worsen.  BP Readings from Last 3 Encounters:  08/24/20 124/76  07/03/20 128/81  05/29/20 125/90  A/P: Stable. Continue current medications.

## 2020-09-11 ENCOUNTER — Ambulatory Visit: Payer: BC Managed Care – PPO | Admitting: Neurology

## 2020-10-02 ENCOUNTER — Other Ambulatory Visit: Payer: Self-pay | Admitting: Family Medicine

## 2020-11-05 ENCOUNTER — Encounter: Payer: Self-pay | Admitting: Family Medicine

## 2020-11-05 ENCOUNTER — Other Ambulatory Visit: Payer: Self-pay

## 2020-11-05 MED ORDER — AMITRIPTYLINE HCL 10 MG PO TABS
25.0000 mg | ORAL_TABLET | Freq: Every day | ORAL | 3 refills | Status: DC
Start: 2020-11-05 — End: 2020-12-15

## 2020-12-06 ENCOUNTER — Other Ambulatory Visit: Payer: Self-pay | Admitting: Family Medicine

## 2020-12-09 NOTE — Progress Notes (Addendum)
Phone [phone] In person visit   Subjective:   Marquel Pottenger is a 60 y.o. year old very pleasant male patient who presents for/with See problem oriented charting Chief Complaint  Patient presents with  . Depression    New onset in the last month. Patient states it feels like what he was feeling 2 years ago. (2nd time). Patient states he seen his therapist last Friday and they agreed he needed to be seen .    This visit occurred during the SARS-CoV-2 public health emergency.  Safety protocols were in place, including screening questions prior to the visit, additional usage of staff PPE, and extensive cleaning of exam room while observing appropriate contact time as indicated for disinfecting solutions.   Past Medical History-  Patient Active Problem List   Diagnosis Date Noted  . Depression, major, recurrent ([place]) 08/16/2018    Priority: High  . ULCERATIVE COLITIS-LEFT SIDE.  Follows with Dr. Fuller [facility] 05/06/2008    Priority: High  . Essential hypertension 03/12/2019    Priority: Medium  . 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 History of adenomatous polyp of colon 05/22/2017    Priority: Medium  . 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 Hyperlipidemia 05/15/2007    Priority: Medium  . Migraines 05/03/2007    Priority: Medium  . Hyperglycemia 01/26/2018    Priority: Low  . Vasovagal syncope 02/14/2017    Priority: Low  . Hearing loss 08/04/2016    Priority: Low  . Low back pain 02/09/2016    Priority: Low  . DOE (dyspnea on exertion) 04/15/2014    Priority: Low  . 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 Vitamin D deficiency 10/27/2009    Priority: Low  . GERD 01/31/2008    Priority: Low  . INTERNAL HEMORRHOIDS 01/23/2008    Priority: Low  . Allergic rhinitis 05/03/2007    Priority: Low  . Raynaud's disease 11/15/2019  . Family history of alpha 1 antitrypsin deficiency 06/14/2018     Medications- reviewed and updated Current Outpatient Medications  Medication Sig Dispense Refill  . ALPRAZolam (XANAX) 0.25 MG tablet Take 1 tablet (0.25 mg total) by mouth 2 (two) times daily as needed for anxiety. 20 tablet 0  . amitriptyline (ELAVIL) 10 MG tablet Take 2.5 tablets (25 mg total) by mouth at bedtime. 90 tablet 3  . balsalazide (COLAZAL) 750 MG capsule TAKE 3 CAPSULES (=2250MG) 3TIMES A DAY. 810 capsule 1  . cetirizine (ZYRTEC) 10 MG tablet Take 10 mg by mouth daily.    . chlorthalidone (HYGROTON) 25 MG tablet TAKE 1 TABLET BY MOUTH EVERY DAY 90 tablet 3  . desvenlafaxine (PRISTIQ) 50 MG 24 hr tablet Take 1 tablet (50 mg total) by mouth daily. 30 tablet 5  . diphenhydrAMINE (BENADRYL) 25 MG tablet Take 25 mg by mouth at bedtime as needed.    . Famotidine (PEPCID PO) Take by mouth.    . fluticasone (FLONASE) 50 MCG/ACT nasal spray USE 1 SPRAY IN EACH NOSTRILTWICE DAILY 48 g 3  . Galcanezumab-gnlm (EMGALITY) 120 MG/ML SOAJ Inject 120 mg into the skin every 30 (thirty) days. 3.36 mL 0  . ibuprofen (ADVIL,MOTRIN) 200 MG tablet Take 200 mg by mouth as needed.      Marland Kitchen losartan (COZAAR) 50 MG tablet TAKE 1 TABLET BY MOUTH EVERY DAY 90 tablet 3  . Melatonin 5 MG CAPS Take 1 capsule by mouth at bedtime.    Marland Kitchen  propranolol ER (INDERAL LA) 60 MG 24 hr capsule TAKE 1 CAPSULE BY MOUTH EVERY DAY 90 capsule 1  . SUMAtriptan (IMITREX) 100 MG tablet MAY REPEAT IN 2 HOURS IF HEADACHE PERSISTS OR RECURS. 27 tablet 1  . Vitamin D, Cholecalciferol, 50 MCG (2000 UT) CAPS Take 1 capsule by mouth daily.    . mesalamine (CANASA) 1000 MG suppository Place 1 suppository (1,000 mg total) rectally at bedtime. (Patient not taking: Reported on 12/11/2020) 90 suppository 0   No current facility-administered medications for this visit.     Objective:  BP 118/88   Pulse 70   Temp (!) 96.9 F (36.1 C) (Temporal)   Ht 5' 10"  (1.778 m)   Wt 197 lb 9.6 oz (89.6 kg)   SpO2 98%   BMI 28.35 kg/m  Gen: NAD,  resting comfortably CV: RRR no murmurs rubs or gallops Lungs: CTAB no crackles, wheeze, rhonchi Ext: no edema Skin: warm, dry     Assessment and Plan   # Depression with some anxiety S: Medication:none at present, prior lexapro 10 mg (had anorgasmia)  Patient with history of depression in November 2019- was able to come off medicine previously. Therapist visits have continued- therapist thought patient needed to be seen by [facility] to consider meds again  . About a month of symptoms- states would say mild to moderate. Quiet moments gets periods of sadness and gets weepier. Also things that normally wouldn't bother him can cause frustration- something fell in kitchen and broke and he cried.   Stress with work- things are not going well at [employer]. Things from childhood coming up with mother being close/triggering. Some anxiety/rumination Depression screen [facility] 2/9 12/11/2020 08/24/2020 02/19/2020  Decreased Interest 1 0 0  Down, Depressed, Hopeless 3 0 0  PHQ - 2 Score 4 0 0  Altered sleeping 1 0 0  Tired, decreased energy 2 0 0  Change in appetite 0 0 0  Feeling bad or failure about yourself  2 0 0  Trouble concentrating 2 0 0  Moving slowly or fidgety/restless 0 0 0  Suicidal thoughts 0 0 0  PHQ-9 Score 11 0 0  Difficult doing work/chores Somewhat difficult - Not difficult at all  Some recent data might be hidden  A/P: recurrent depression -he did not tolerate the sexual side effects of Lexapro.  He has not been overly impressed with data on Wellbutrin and in addition may worsen anxiety.  We opted to try Pristiq 50 mg with follow-up in 6 weeks.  Suicidal precautions/side effect discussion given. -Patient is concerned with his anxiety about starting medicine so we opted to give him a short-term course of Xanax-he has tolerated this well in the past.  Wrote for patient to avoid driving for 6 to 8 hours after use  # SYncopal episode- likely orthostatic S: syncopal episode 1 month ago.    fewer dizzy episodes at gym since being on propranolol but still happening some. About a month ago in middle of night was hot- stood up quickly and got hands up over head to take t shirt off and next thing he knew he was waking up. Short period of LOC 5-10 seconds. Was feeling sweaty before this  And got up quickly- now sitting on side of bed until count of 10 and getting up slower.  History of orthostatic issues in the past- likely worse on meds  Tries to stay hydrated- 12 oz x 3 coffee, coffee (no caffeine after 9 in AM), tea. 8 oz  water lunch, decaf tea and then drinks after gym and then maybe 8 oz at night.   No chest pain or shortness of breath with episode or outside of episode.   Had right sided headache 2-3 days- thinks may have hit head. No lingering headaches. This happened  A/P: This certainly sounds like orthostatic hypotension.  May have vasovagal element with arms being above head and being sweaty.  We opted not to work-up further unless recurrence.  He may have hit his head but no lingering headaches-I do not think he needs neuroimaging has not on anticoagulant or antiplatelet.  Discussed potentially pushing fluids more with being on chlorthalidone and heavier caffeine intake in the morning which could act as a diuretic as well -Last time was on treatment about 9 months-we discussed at least a longer course but he would still like to consider coming back off medication-obviously if he had another recurrence at that point would recommend lifelong therapy -Continue work with therapist  #hypertension  S: medication:  Losartan 38m, chlorthalidone 25 mg, propranolol 60 mg  Home readings #s:  118/86 typically BP Readings from Last 3 Encounters:  12/11/20 118/88  08/24/20 124/76  07/03/20 128/81  A/P: Initial blood pressure high normal-better on repeat but diastolic still high normal-continue to work on healthy eating/regular exercise.  His weight has trended up-depression may have  contributed-he is planning to work on this  WAbbott LaboratoriesReadings from Last 3 Encounters:  12/11/20 197 lb 9.6 oz (89.6 kg)  08/24/20 189 lb 12.8 oz (86.1 kg)  07/03/20 187 lb (84.8 kg)     # heart racing/palpitations- doing better on propranolol  Recommended follow up: Return in about 6 weeks (around 01/22/2021) for follow up- or sooner if needed. Future Appointments  Date Time Provider [place] 01/08/2021 10:50 AM JPieter Partridge DO [facility] None  01/18/2021  9:20 AM HMarin Olp MD [facility]  03/29/2021  8:15 AM [facility] LAB [facility]  04/01/2021  8:20 AM HYong Channel SBrayton Mars MD [facility]    Lab/Order associations:   ICD-10-CM   1. Moderate episode of recurrent major depressive disorder (HCC)  F33.1   2. Essential hypertension  I10   3. Syncope due to orthostatic hypotension  I95.1     Meds ordered this encounter  Medications  . desvenlafaxine (PRISTIQ) 50 MG 24 hr tablet    Sig: Take 1 tablet (50 mg total) by mouth daily.    Dispense:  30 tablet    Refill:  5  . ALPRAZolam (XANAX) 0.25 MG tablet    Sig: Take 1 tablet (0.25 mg total) by mouth 2 (two) times daily as needed for anxiety.    Dispense:  20 tablet    Refill:  0   Return precautions advised.  SGarret Reddish MD

## 2020-12-09 NOTE — Patient Instructions (Addendum)
Start pristiq 76m. Lets schedule follow up in 6 weeks or sooner if needed.   Xanax as back up if anxiety worse in first 2 weeks  Taking the medicine as directed and not missing any doses is one of the best things you can do to treat your depression/anxiety.  Here are some things to keep in mind:  1) Side effects (stomach upset, some increased anxiety) may happen before you notice a benefit.  These side effects typically go away over time. 2) Changes to your dose of medicine or a change in medication all together is sometimes necessary 3) Most people need to be on medication at least 6-12 months- since this is a recurrence id be more likely to prolong therapy 4) Many people will notice an improvement within two weeks but the full effect of the medication can take up to 6 weeks 5) Stopping the medication when you start feeling better often results in a return of symptoms 6) If you start having thoughts of hurting yourself or others after starting this medicine, call our office immediately at [phone] seek care through 911.    Recommended follow up: Return in about 6 weeks (around 01/22/2021) for follow up- or sooner if needed.

## 2020-12-11 ENCOUNTER — Other Ambulatory Visit: Payer: Self-pay

## 2020-12-11 ENCOUNTER — Ambulatory Visit: Payer: BC Managed Care – PPO | Admitting: Family Medicine

## 2020-12-11 ENCOUNTER — Encounter: Payer: Self-pay | Admitting: Family Medicine

## 2020-12-11 VITALS — BP 118/88 | HR 70 | Temp 96.9°F | Ht 70.0 in | Wt 197.6 lb

## 2020-12-11 DIAGNOSIS — F331 Major depressive disorder, recurrent, moderate: Secondary | ICD-10-CM | POA: Diagnosis not present

## 2020-12-11 DIAGNOSIS — I10 Essential (primary) hypertension: Secondary | ICD-10-CM | POA: Diagnosis not present

## 2020-12-11 DIAGNOSIS — I951 Orthostatic hypotension: Secondary | ICD-10-CM | POA: Diagnosis not present

## 2020-12-11 DIAGNOSIS — F321 Major depressive disorder, single episode, moderate: Secondary | ICD-10-CM

## 2020-12-11 MED ORDER — DESVENLAFAXINE SUCCINATE ER 50 MG PO TB24: 50.0000 mg | ORAL_TABLET | Freq: Every day | ORAL | 5 refills | Status: DC

## 2020-12-11 MED ORDER — ALPRAZOLAM 0.25 MG PO TABS: 0.2500 mg | ORAL_TABLET | Freq: Two times a day (BID) | ORAL | 0 refills | Status: DC | PRN

## 2020-12-14 ENCOUNTER — Encounter: Payer: Self-pay | Admitting: Family Medicine

## 2020-12-15 ENCOUNTER — Other Ambulatory Visit: Payer: Self-pay

## 2020-12-15 MED ORDER — AMITRIPTYLINE HCL 25 MG PO TABS: 25.0000 mg | ORAL_TABLET | Freq: Every day | ORAL | 3 refills | Status: DC

## 2021-01-02 ENCOUNTER — Other Ambulatory Visit: Payer: Self-pay | Admitting: Family Medicine

## 2021-01-07 ENCOUNTER — Other Ambulatory Visit: Payer: Self-pay | Admitting: Family Medicine

## 2021-01-07 NOTE — Progress Notes (Signed)
NEUROLOGY FOLLOW UP OFFICE NOTE  [name] [id number]  Assessment/Plan:   Migraine without aura, without status migrainosus, not intractable  1.  Migraine prevention:  Emgality.  Also on amitriptyline (for sleep) and propranolol (for palpitations). 2.  Migraine rescue:  Sumatriptan 157m 3.  Limit use of pain relievers to no more than 2 days out of week to prevent risk of rebound or medication-overuse headache. 4.  Keep headache diary 5.  Follow up one year  Subjective:  [name] a 60year old Caucasian man with depression, anxiety and Gilbert syndrome whofollows up for migraines and possible left-sided trigeminal neuralgia.  UPDATE: In September, amitriptyline was reduced to 1100mat bedtime as it was ineffective for migraines but did help him sleep.  He had increased depression, so his other provider.  No real improvement in headaches.  He was experiencing sinus tachycardia and was prescribed propranolol by Dr. HuYong Channel He will be starting today.  Migraines are less intense and not much prodrome.  They tend to occur often in middle of night or late morning.  Intensity:Moderate (no severe) Duration:1 hour.   Frequency:4 to 5 a month (often in clusters 3 days in a row)   Current NSAIDS:none Current analgesics:Sumatriptan 5078murrent triptans:none Current ergotamine:none Current anti-emetic:Promethazine 12.5 mg Current muscle relaxants:none Current anti-anxiolytic:none Current sleep aide:melatonin Current Antihypertensive medications:propranolol ER 56m33malpitations); losartan Current Antidepressant medications:amitriptyline 25mg36m Current Anticonvulsant medications:none Current anti-CGRP:Emgality Current Vitamins/Herbal/Supplements:Vitamin D, melatonin Current Antihistamines/Decongestants:Benadryl, Zyrtec Other therapy:none Other medications:none  Caffeine:1 mug of coffee daily Diet:Drinks  water Exercise:yes Depression:yes; Anxiety:yes - started Pristiq in February and now improved. Other pain:Muscle aches Sleep hygiene:Poor.  HISTORY: Onset: Childhoodand then since late 20s Location:Unilateral (either side) left behind eye radiating to back of head Quality:pressure InitialIntensity:7/10.Hedenies new headache, thunderclap headache. Sometimes wakes up with them. Aura:Onrareoccasions had jagged lines in vision Premonitory Phase:Mild numb sensation, flushing of ear on side of headache Postdrome:A day he has a fatigue sensation Associated symptoms: Photophobia, phonophobia, osmophobia, nausea. Hedenies associated unilateral numbness or weakness. InitialDuration:5-6 hours (if takes sumatriptan immediately, then 1 hour) InitialFrequency:10 to 15 days InitialFrequency of abortive medication:at least 10 days a month Triggers: Some strong smells, maybe chocolate or red wine, often after a stressful event. Relieving factors: Sumatriptan Activity:aggravates  In March2020, he had a new migraine associated with aura. 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 He has had one subsequent event.  In October-November 2020,He had an episode of possible left-sided trigeminal neuralgia. He described aspasm on side of his left eye radiating to the cheek. It was an electric shock. It lasted a second and occurred 3 times. Eye exam was fine.   Past NSAIDS:Ibuprofen, naproxen Past analgesics:Tylenol Past abortive triptans:rizatriptan, eletriptan? Past abortive ergotamine:none Past muscle relaxants:none Past anti-emetic:none Past antihypertensive medications:metoprolol Past antidepressant medications:doxepin, Lexapro Past anticonvulsant medications:none Past anti-CGRP:Nurtec(effective but comparable to sumatriptan in efficacy) Past  vitamins/Herbal/Supplements:none Past antihistamines/decongestants:none Other past therapies:none  Family history of headache:Father, niece  PAST MEDICAL HISTORY: Past Medical History:  Diagnosis Date  . Allergic rhinitis   . Allergy    seasonal  . Arthritis   . GERD (gastroesophageal reflux disease)   . Gilbert syndrome   . Heart murmur    tricuspid valve regurgitation  . Hyperlipidemia   . Hypertension   . Internal hemorrhoids   . Migraines   . Palpitations   . Panic attack    occasional  . Syncope    on airplane in 12/2016/ due  to stress  . 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 ALPRAZolam (XANAX) 0.25 MG tablet Take 1 tablet (0.25 mg total) by mouth 2 (two) times daily as needed for anxiety. 20 tablet 0  . amitriptyline (ELAVIL) 25 MG tablet Take 1 tablet (25 mg total) by mouth at bedtime. 90 tablet 3  . balsalazide (COLAZAL) 750 MG capsule TAKE 3 CAPSULES (=2250MG) 3TIMES A DAY. 810 capsule 1  . cetirizine (ZYRTEC) 10 MG tablet Take 10 mg by mouth daily.    . chlorthalidone (HYGROTON) 25 MG tablet TAKE 1 TABLET BY MOUTH EVERY DAY 90 tablet 3  . desvenlafaxine (PRISTIQ) 50 MG 24 hr tablet TAKE 1 TABLET BY MOUTH EVERY DAY 90 tablet 2  . diphenhydrAMINE (BENADRYL) 25 MG tablet Take 25 mg by mouth at bedtime as needed.    . Famotidine (PEPCID PO) Take by mouth.    . fluticasone (FLONASE) 50 MCG/ACT nasal spray USE 1 SPRAY IN EACH NOSTRILTWICE DAILY 48 g 3  . Galcanezumab-gnlm (EMGALITY) 120 MG/ML SOAJ Inject 120 mg into the skin every 30 (thirty) days. 3.36 mL 0  . ibuprofen (ADVIL,MOTRIN) 200 MG tablet Take 200 mg by mouth as needed.      Marland Kitchen losartan (COZAAR) 50 MG tablet TAKE 1 TABLET BY MOUTH EVERY DAY 90 tablet 3  . Melatonin 5 MG CAPS Take 1 capsule by mouth at bedtime.    . mesalamine (CANASA) 1000 MG suppository Place 1 suppository (1,000 mg total)  rectally at bedtime. (Patient not taking: Reported on 12/11/2020) 90 suppository 0  . propranolol ER (INDERAL LA) 60 MG 24 hr capsule TAKE 1 CAPSULE BY MOUTH EVERY DAY 90 capsule 1  . SUMAtriptan (IMITREX) 100 MG tablet MAY REPEAT IN 2 HOURS IF HEADACHE PERSISTS OR RECURS. 27 tablet 1  . Vitamin D, Cholecalciferol, 50 MCG (2000 UT) CAPS Take 1 capsule by mouth daily.    . [DISCONTINUED] glycopyrrolate (ROBINUL) 2 MG tablet Take 2 mg by mouth 2 (two) times daily.       No current facility-administered medications on file prior to visit.    ALLERGIES: Allergies  Allergen Reactions  . Shellfish Allergy Nausea And Vomiting    scallops  . Citrus     Oral pain, rash in mouth  . Codeine Nausea Only  . Salmon [Fish Allergy] Nausea Only and Rash    Especially smoked salmon    FAMILY HISTORY: Family History  Problem Relation Age of Onset  . Thyroid disease Father   . Heart disease Father        a fib, expanded aorta  . Arthritis Mother   . Colon cancer Other        Grandmother's Family (x 1 brother to grandmother and 1 sister to grandmother)  . Crohn's disease Paternal Aunt   . Crohn's disease Cousin   . Colon polyps Maternal Grandmother        Great Aunt, Great Uncle  . Heart disease Brother   . Colon polyps Brother   . Esophageal cancer Neg Hx   . Stomach cancer Neg Hx   . Rectal cancer Neg Hx       Objective:  Blood pressure 128/86, pulse 74, height 5' 10"  (1.778 m), weight 197 lb 9.6 oz (89.6 kg), SpO2 100 %. General: No acute distress.  Patient appears well-groomed.     Metta Clines, DO  CC: [name], MD

## 2021-01-08 ENCOUNTER — Encounter: Payer: Self-pay | Admitting: Neurology

## 2021-01-08 ENCOUNTER — Ambulatory Visit: Payer: BC Managed Care – PPO | Admitting: Neurology

## 2021-01-08 ENCOUNTER — Other Ambulatory Visit: Payer: Self-pay

## 2021-01-08 VITALS — BP 128/86 | HR 74 | Ht 70.0 in | Wt 197.6 lb

## 2021-01-08 DIAGNOSIS — G43009 Migraine without aura, not intractable, without status migrainosus: Secondary | ICD-10-CM

## 2021-01-08 MED ORDER — EMGALITY 120 MG/ML ~~LOC~~ SOAJ
120.0000 mg | SUBCUTANEOUS | 1 refills | Status: DC
Start: 2021-01-08 — End: 2021-05-11

## 2021-01-08 NOTE — Patient Instructions (Signed)
1.  Continue Emgality.  Also continue propranolol and amitriptyline as per Dr. Yong Channel 2.  Sumatripan as needed.  Limit use of pain relievers to no more than 2 days out of week to prevent risk of rebound or medication-overuse headache. 3.  Keep headache diary 4.  Follow up one year - virtual visit.

## 2021-01-14 NOTE — Patient Instructions (Addendum)
Continue current meds. Let me know if any worsening symptoms or if side effects become unbearable   Recommended follow up: keep June visit

## 2021-01-14 NOTE — Progress Notes (Signed)
Phone [phone] In person visit   Subjective:   Needham Biggins is a 60 y.o. year old very pleasant male patient who presents for/with See problem oriented charting Chief Complaint  Patient presents with  . Depression  . Hypertension  . Anxiety   This visit occurred during the SARS-CoV-2 public health emergency.  Safety protocols were in place, including screening questions prior to the visit, additional usage of staff PPE, and extensive cleaning of exam room while observing appropriate contact time as indicated for disinfecting solutions.   Past Medical History-  Patient Active Problem List   Diagnosis Date Noted  . Depression, major, recurrent ([place]) 08/16/2018    Priority: High  . ULCERATIVE COLITIS-LEFT SIDE.  Follows with Dr. Fuller [facility] 05/06/2008    Priority: High  . Essential hypertension 03/12/2019    Priority: Medium  . 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 History of adenomatous polyp of colon 05/22/2017    Priority: Medium  . 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 Hyperlipidemia 05/15/2007    Priority: Medium  . Migraines 05/03/2007    Priority: Medium  . Hyperglycemia 01/26/2018    Priority: Low  . Vasovagal syncope 02/14/2017    Priority: Low  . Hearing loss 08/04/2016    Priority: Low  . Low back pain 02/09/2016    Priority: Low  . DOE (dyspnea on exertion) 04/15/2014    Priority: Low  . 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 Vitamin D deficiency 10/27/2009    Priority: Low  . GERD 01/31/2008    Priority: Low  . INTERNAL HEMORRHOIDS 01/23/2008    Priority: Low  . Allergic rhinitis 05/03/2007    Priority: Low  . Raynaud's disease 11/15/2019  . Family history of alpha 1 antitrypsin deficiency 06/14/2018    Medications- reviewed and updated Current Outpatient Medications  Medication Sig Dispense Refill  . ALPRAZolam (XANAX) 0.25 MG tablet Take 1 tablet (0.25 mg total) by mouth  2 (two) times daily as needed for anxiety. 20 tablet 0  . amitriptyline (ELAVIL) 25 MG tablet Take 1 tablet (25 mg total) by mouth at bedtime. 90 tablet 3  . balsalazide (COLAZAL) 750 MG capsule TAKE 3 CAPSULES (=2250MG) 3TIMES A DAY. (Patient taking differently: TAKE 3 CAPSULES (=2250MG) 2TIMES A DAY.) 810 capsule 1  . cetirizine (ZYRTEC) 10 MG tablet Take 10 mg by mouth daily.    . chlorthalidone (HYGROTON) 25 MG tablet TAKE 1 TABLET BY MOUTH EVERY DAY 90 tablet 3  . diphenhydrAMINE (BENADRYL) 25 MG tablet Take 25 mg by mouth at bedtime as needed.    . Famotidine (PEPCID PO) Take by mouth.    . fluticasone (FLONASE) 50 MCG/ACT nasal spray USE 1 SPRAY IN EACH NOSTRILTWICE DAILY 48 g 3  . Galcanezumab-gnlm (EMGALITY) 120 MG/ML SOAJ Inject 120 mg into the skin every 30 (thirty) days. 3.36 mL 1  . ibuprofen (ADVIL,MOTRIN) 200 MG tablet Take 200 mg by mouth as needed.      Marland Kitchen losartan (COZAAR) 50 MG tablet TAKE 1 TABLET BY MOUTH EVERY DAY 90 tablet 3  . Melatonin 5 MG CAPS Take 1 capsule by mouth at bedtime.    . propranolol ER (INDERAL LA) 60 MG 24 hr capsule TAKE 1 CAPSULE BY MOUTH EVERY DAY 90 capsule 1  . SUMAtriptan (IMITREX) 100 MG tablet MAY REPEAT IN 2 HOURS IF HEADACHE PERSISTS OR RECURS. 27 tablet 1  . Vitamin  D, Cholecalciferol, 50 MCG (2000 UT) CAPS Take 1 capsule by mouth daily.    Marland Kitchen desvenlafaxine (PRISTIQ) 50 MG 24 hr tablet Take 1 tablet (50 mg total) by mouth daily. 90 tablet 3  . mesalamine (CANASA) 1000 MG suppository Place 1 suppository (1,000 mg total) rectally at bedtime. (Patient not taking: No sig reported) 90 suppository 0   No current facility-administered medications for this visit.     Objective:  BP 126/84   Pulse 75   Temp (!) 97.5 F (36.4 C) (Temporal)   Ht 5' 10"  (1.778 m)   Wt 195 lb 6.4 oz (88.6 kg)   SpO2 99%   BMI 28.04 kg/m  Gen: NAD, resting comfortably CV: RRR no murmurs rubs or gallops Lungs: CTAB no crackles, wheeze, rhonchi Ext: no edema Skin:  warm, dry    Assessment and Plan   # Depression S: Medication: Pristiq 50 mg. Initially noted more headaches but improved 2.5 weeks.  - similar side effects with pristiq as the lexapro unfortunately (orgasm issues) -Sexual side effects on Lexapro in past, not overly impressed with data on Wellbutrin -We started Pristiq we gave a short-term amount of Xanax Depression screen [facility] 2/9 01/18/2021 12/11/2020 08/24/2020  Decreased Interest 1 1 0  Down, Depressed, Hopeless 1 3 0  PHQ - 2 Score 2 4 0  Altered sleeping 1 1 0  Tired, decreased energy 1 2 0  Change in appetite 0 0 0  Feeling bad or failure about yourself  0 2 0  Trouble concentrating 0 2 0  Moving slowly or fidgety/restless 0 0 0  Suicidal thoughts 0 0 0  PHQ-9 Score 4 11 0  Difficult doing work/chores Somewhat difficult Somewhat difficult -  Some recent data might be hidden  A/P: Significant improvement per PHQ-9.  Would consider full remission-continue current medication-since he has had recurrence off medicine I think we should maintain medicine for time being  -orgasm issues but can get erection -continue current meds until has trip to [place]. Imperfect control and some side effect but phq9 under 5- will consider full remission -with sexual side effects- still wants to continue -did not have as rapid correction as he did with lexapro. Sad episodes down to 4-7 x a week instead of 2-3 x a day.   #Syncopal episode-suspected orthostatic as patient stood up quickly in the middle the night while trying to get his shirt off-also potential vasovagal element-see visit notes from last visit.  He has not had recurrence and we opted to monitor unless he had recurrence. Still some orthostatic symptoms but no syncope- trying to keep up his hydration  #hypertension S: medication: Losartan 50 mg, chlorthalidone 25 mg, propranolol 60 mg Home readings #s: stopped checking recently  BP Readings from Last 3 Encounters:  01/18/21 126/84   01/08/21 128/86  12/11/20 118/88  A/P: Blood pressure slightly high last visit but improved today.  Patient has lost 2 pounds from last visit-keeping up gym 3x a week, reasonably healthy diet still   Recommended follow up: keep June visit  Future Appointments  Date Time Provider [place]  03/29/2021  8:15 AM [facility]  04/01/2021  8:20 AM Marin Olp, MD [facility]  01/10/2022  8:50 AM Pieter Partridge, DO [facility] None   Lab/Order associations:   ICD-10-CM   1. Recurrent major depressive disorder, in full remission ([place])  F33.42   2. Essential hypertension  I10     Meds ordered this encounter  Medications  .  desvenlafaxine (PRISTIQ) 50 MG 24 hr tablet    Sig: Take 1 tablet (50 mg total) by mouth daily.    Dispense:  90 tablet    Refill:  3   Return precautions advised.  [name], MD

## 2021-01-18 ENCOUNTER — Other Ambulatory Visit: Payer: Self-pay

## 2021-01-18 ENCOUNTER — Encounter: Payer: Self-pay | Admitting: Family Medicine

## 2021-01-18 ENCOUNTER — Ambulatory Visit: Payer: BC Managed Care – PPO | Admitting: Family Medicine

## 2021-01-18 VITALS — BP 126/84 | HR 75 | Temp 97.5°F | Ht 70.0 in | Wt 195.4 lb

## 2021-01-18 DIAGNOSIS — F3342 Major depressive disorder, recurrent, in full remission: Secondary | ICD-10-CM

## 2021-01-18 DIAGNOSIS — I10 Essential (primary) hypertension: Secondary | ICD-10-CM

## 2021-01-18 MED ORDER — DESVENLAFAXINE SUCCINATE ER 50 MG PO TB24: 50.0000 mg | ORAL_TABLET | Freq: Every day | ORAL | 3 refills | Status: DC

## 2021-02-03 ENCOUNTER — Encounter: Payer: Self-pay | Admitting: Neurology

## 2021-02-03 NOTE — Progress Notes (Signed)
[place] [facility] Key: [id number] help? Call us at [phone] Outcome Additional Information Required Your PA has been resolved, no additional PA is required. For further inquiries please contact the number on the back of the member prescription card. (Message [phone])

## 2021-03-17 ENCOUNTER — Encounter: Payer: Self-pay | Admitting: Family Medicine

## 2021-03-17 ENCOUNTER — Other Ambulatory Visit: Payer: Self-pay

## 2021-03-17 MED ORDER — BUPROPION HCL ER (XL) 150 MG PO TB24: 150.0000 mg | ORAL_TABLET | Freq: Two times a day (BID) | ORAL | 3 refills | Status: DC

## 2021-03-17 MED ORDER — BUPROPION HCL ER (XL) 150 MG PO TB24: 150.0000 mg | ORAL_TABLET | Freq: Every day | ORAL | 5 refills | Status: DC

## 2021-03-17 MED ORDER — DESVENLAFAXINE SUCCINATE ER 25 MG PO TB24: ORAL_TABLET | ORAL | 5 refills | Status: DC

## 2021-03-29 ENCOUNTER — Other Ambulatory Visit: Payer: Self-pay

## 2021-03-29 ENCOUNTER — Other Ambulatory Visit (INDEPENDENT_AMBULATORY_CARE_PROVIDER_SITE_OTHER): Payer: BC Managed Care – PPO

## 2021-03-29 DIAGNOSIS — I10 Essential (primary) hypertension: Secondary | ICD-10-CM

## 2021-03-29 DIAGNOSIS — Z125 Encounter for screening for malignant neoplasm of prostate: Secondary | ICD-10-CM

## 2021-03-29 DIAGNOSIS — E559 Vitamin D deficiency, unspecified: Secondary | ICD-10-CM

## 2021-03-29 DIAGNOSIS — E785 Hyperlipidemia, unspecified: Secondary | ICD-10-CM

## 2021-03-30 LAB — COMPLETE METABOLIC PANEL WITH GFR
AG Ratio: 2 (calc) (ref 1.0–2.5)
ALT: 12 U/L (ref 9–46)
AST: 15 U/L (ref 10–35)
Albumin: 4.7 g/dL (ref 3.6–5.1)
Alkaline phosphatase (APISO): 38 U/L (ref 35–144)
BUN: 15 mg/dL (ref 7–25)
CO2: 33 mmol/L — ABNORMAL HIGH (ref 20–32)
Calcium: 9.7 mg/dL (ref 8.6–10.3)
Chloride: 97 mmol/L — ABNORMAL LOW (ref 98–110)
Creat: 1.27 mg/dL (ref 0.70–1.33)
GFR, Est African American: 72 mL/min/{1.73_m2} (ref >=60)
GFR, Est Non African American: 62 mL/min/{1.73_m2} (ref >=60)
Globulin: 2.3 g/dL (calc) (ref 1.9–3.7)
Glucose, Bld: 90 mg/dL (ref 65–99)
Potassium: 3.9 mmol/L (ref 3.5–5.3)
Sodium: 138 mmol/L (ref 135–146)
Total Bilirubin: 1.2 mg/dL (ref 0.2–1.2)
Total Protein: 7 g/dL (ref 6.1–8.1)

## 2021-03-30 LAB — LIPID PANEL W/REFLEX DIRECT LDL
Cholesterol: 255 mg/dL — ABNORMAL HIGH (ref <200)
HDL: 69 mg/dL (ref >=40)
LDL Cholesterol (Calc): 165 mg/dL (calc) — ABNORMAL HIGH
Non-HDL Cholesterol (Calc): 186 mg/dL (calc) — ABNORMAL HIGH (ref <130)
Total CHOL/HDL Ratio: 3.7 (calc) (ref <5.0)
Triglycerides: 101 mg/dL (ref <150)

## 2021-03-30 LAB — CBC WITH DIFFERENTIAL/PLATELET
Absolute Monocytes: 616 cells/uL (ref 200–950)
Basophils Absolute: 39 cells/uL (ref 0–200)
Basophils Relative: 0.7 %
Eosinophils Absolute: 22 cells/uL (ref 15–500)
Eosinophils Relative: 0.4 %
HCT: 42.2 % (ref 38.5–50.0)
Hemoglobin: 14.2 g/dL (ref 13.2–17.1)
Lymphs Abs: 1205 cells/uL (ref 850–3900)
MCH: 29.9 pg (ref 27.0–33.0)
MCHC: 33.6 g/dL (ref 32.0–36.0)
MCV: 88.8 fL (ref 80.0–100.0)
MPV: 9.3 fL (ref 7.5–12.5)
Monocytes Relative: 11.2 %
Neutro Abs: 3619 cells/uL (ref 1500–7800)
Neutrophils Relative %: 65.8 %
Platelets: 232 10*3/uL (ref 140–400)
RBC: 4.75 10*6/uL (ref 4.20–5.80)
RDW: 12.8 % (ref 11.0–15.0)
Total Lymphocyte: 21.9 %
WBC: 5.5 10*3/uL (ref 3.8–10.8)

## 2021-03-30 LAB — VITAMIN D 25 HYDROXY (VIT D DEFICIENCY, FRACTURES): Vit D, 25-Hydroxy: 43 ng/mL (ref 30–100)

## 2021-03-30 LAB — PSA: PSA: 0.49 ng/mL (ref <=4.00)

## 2021-04-01 ENCOUNTER — Encounter: Payer: Self-pay | Admitting: Family Medicine

## 2021-04-01 ENCOUNTER — Other Ambulatory Visit: Payer: Self-pay

## 2021-04-01 ENCOUNTER — Ambulatory Visit (INDEPENDENT_AMBULATORY_CARE_PROVIDER_SITE_OTHER): Payer: BC Managed Care – PPO | Admitting: Family Medicine

## 2021-04-01 VITALS — BP 110/82 | HR 70 | Temp 97.2°F | Ht 70.0 in | Wt 199.8 lb

## 2021-04-01 DIAGNOSIS — K5150 Left sided colitis without complications: Secondary | ICD-10-CM | POA: Diagnosis not present

## 2021-04-01 DIAGNOSIS — E785 Hyperlipidemia, unspecified: Secondary | ICD-10-CM

## 2021-04-01 DIAGNOSIS — G43119 Migraine with aura, intractable, without status migrainosus: Secondary | ICD-10-CM | POA: Diagnosis not present

## 2021-04-01 DIAGNOSIS — I10 Essential (primary) hypertension: Secondary | ICD-10-CM | POA: Diagnosis not present

## 2021-04-01 DIAGNOSIS — E559 Vitamin D deficiency, unspecified: Secondary | ICD-10-CM

## 2021-04-01 DIAGNOSIS — Z0001 Encounter for general adult medical examination with abnormal findings: Secondary | ICD-10-CM

## 2021-04-01 DIAGNOSIS — F3341 Major depressive disorder, recurrent, in partial remission: Secondary | ICD-10-CM | POA: Diagnosis not present

## 2021-04-01 MED ORDER — ESCITALOPRAM OXALATE 10 MG PO TABS: 10.0000 mg | ORAL_TABLET | Freq: Every day | ORAL | 3 refills | Status: DC

## 2021-04-01 NOTE — Assessment & Plan Note (Signed)
S: medication: Losartan  50 mg daily, Chlorthalidone 25 mg daily, Inderal LA 60 mg daily (also helps with palpitations) When he lies down he experiences a lot of orthostatic hypotension. Home readings #s: average [id number] systolic over 97Q diastolic mainly  BP Readings from Last 3 Encounters:  04/01/21 110/82  01/18/21 126/84  01/08/21 128/86  A/P: Good control but perhaps overcontrolled-due to orthostatic hypotension, reduce Chlorthalidone to half tablet and update me in 2-3 weeks.  Continue losartan 50 mg and propranolol 60 mg for now.  He does get some edema in the left leg at times could perhaps try losartan 25 mg and chlorthalidone 25 mg if needed in the future

## 2021-04-01 NOTE — Progress Notes (Signed)
Phone [phone] In person visit   Subjective:   [name] is a 60 y.o. year old very pleasant male patient who presents for/with See problem oriented charting  This visit occurred during the SARS-CoV-2 public health emergency.  Safety protocols were in place, including screening questions prior to the visit, additional usage of staff PPE, and extensive cleaning of exam room while observing appropriate contact time as indicated for disinfecting solutions.   Past Medical History-  Patient Active Problem List   Diagnosis Date Noted   Depression, major, recurrent ([place]) 08/16/2018    Priority: High   ULCERATIVE COLITIS-LEFT SIDE.  Follows with Dr. Fuller [facility] 05/06/2008    Priority: High   Raynaud's disease 11/15/2019    Priority: Medium   Essential hypertension 03/12/2019    Priority: Medium   Gilbert syndrome 01/26/2018    Priority: Medium   History of adenomatous polyp of colon 05/22/2017    Priority: Medium   PSORIASIS, SCALP 05/03/2010    Priority: Medium   Hyperlipidemia 05/15/2007    Priority: Medium   Migraines 05/03/2007    Priority: Medium   Hyperglycemia 01/26/2018    Priority: Low   Vasovagal syncope 02/14/2017    Priority: Low   Hearing loss 08/04/2016    Priority: Low   Low back pain 02/09/2016    Priority: Low   DOE (dyspnea on exertion) 04/15/2014    Priority: Low   LOC 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 LEG 10/12/2010    Priority: Low   Vitamin D deficiency 10/27/2009    Priority: Low   GERD 01/31/2008    Priority: Low   INTERNAL HEMORRHOIDS 01/23/2008    Priority: Low   Allergic rhinitis 05/03/2007    Priority: Low   Family history of alpha 1 antitrypsin deficiency 06/14/2018    Medications- reviewed and updated Current Outpatient Medications  Medication Sig Dispense Refill   ALPRAZolam (XANAX) 0.25 MG tablet Take 1 tablet (0.25 mg total) by mouth 2 (two) times daily as needed for anxiety. 20 tablet 0   amitriptyline (ELAVIL) 25  MG tablet Take 1 tablet (25 mg total) by mouth at bedtime. 90 tablet 3   balsalazide (COLAZAL) 750 MG capsule TAKE 3 CAPSULES (=2250MG) 3TIMES A DAY. (Patient taking differently: TAKE 3 CAPSULES (=2250MG) 2TIMES A DAY.) 810 capsule 1   cetirizine (ZYRTEC) 10 MG tablet Take 10 mg by mouth daily.     chlorthalidone (HYGROTON) 25 MG tablet TAKE 1 TABLET BY MOUTH EVERY DAY 90 tablet 3   diphenhydrAMINE (BENADRYL) 25 MG tablet Take 25 mg by mouth at bedtime as needed.     escitalopram (LEXAPRO) 10 MG tablet Take 1 tablet (10 mg total) by mouth daily. 90 tablet 3   Famotidine (PEPCID PO) Take by mouth.     fluticasone (FLONASE) 50 MCG/ACT nasal spray USE 1 SPRAY IN EACH NOSTRILTWICE DAILY 48 g 3   Galcanezumab-gnlm (EMGALITY) 120 MG/ML SOAJ Inject 120 mg into the skin every 30 (thirty) days. 3.36 mL 1   ibuprofen (ADVIL,MOTRIN) 200 MG tablet Take 200 mg by mouth as needed.       losartan (COZAAR) 50 MG tablet TAKE 1 TABLET BY MOUTH EVERY DAY 90 tablet 3   Melatonin 5 MG CAPS Take 1 capsule by mouth at bedtime.     mesalamine (CANASA) 1000 MG suppository Place 1 suppository (1,000 mg total) rectally at bedtime. 90 suppository 0   propranolol ER (INDERAL LA) 60 MG 24 hr capsule TAKE 1 CAPSULE BY MOUTH EVERY  DAY 90 capsule 1   SUMAtriptan (IMITREX) 100 MG tablet MAY REPEAT IN 2 HOURS IF HEADACHE PERSISTS OR RECURS. 27 tablet 1   Vitamin D, Cholecalciferol, 50 MCG (2000 UT) CAPS Take 1 capsule by mouth daily.     No current facility-administered medications for this visit.     Objective:  BP 110/82   Pulse 70   Temp (!) 97.2 F (36.2 C) (Temporal)   Ht 5' 10"  (1.778 m)   Wt 199 lb 12.8 oz (90.6 kg)   SpO2 98%   BMI 28.67 kg/m  Gen: NAD, resting comfortably     Assessment and Plan   Essential hypertension S: medication: Losartan  50 mg daily, Chlorthalidone 25 mg daily, Inderal LA 60 mg daily (also helps with palpitations) When he lies down he experiences a lot of orthostatic  hypotension. Home readings #s: average 924Q-683M systolic over 19Q diastolic mainly  BP Readings from Last 3 Encounters:  04/01/21 110/82  01/18/21 126/84  01/08/21 128/86  A/P: Good control but perhaps overcontrolled-due to orthostatic hypotension, reduce Chlorthalidone to half tablet and update me in 2-3 weeks.  Continue losartan 50 mg and propranolol 60 mg for now.  He does get some edema in the left leg at times could perhaps try losartan 25 mg and chlorthalidone 25 mg if needed in the future  Depression, major, recurrent ([facility]) S: Medication: Wellbutrin 150 mg extended release, Xanax 0.25 mg, Pristiq 25 mg, Elavil 25 mg -He is having depressive episodes more recently than normal- usually either brief moments or for long periods of time (while driving, sitting at home). Pristiq does not seem to help him. He does want to go back on Lexapro- similar side effects to Pristiq but states that results were better while he was taking it. Sadder now after the change in medications. - he does not want to be sad pr have sexual side effects ultimately Depression screen [facility] 2/9 04/01/2021 01/18/2021 12/11/2020  Decreased Interest 1 1 1   Down, Depressed, Hopeless 3 1 3   PHQ - 2 Score 4 2 4   Altered sleeping 0 1 1  Tired, decreased energy 1 1 2   Change in appetite 0 0 0  Feeling bad or failure about yourself  0 0 2  Trouble concentrating 0 0 2  Moving slowly or fidgety/restless 0 0 0  Suicidal thoughts 0 0 0  PHQ-9 Score 5 4 11   Difficult doing work/chores Somewhat difficult Somewhat difficult Somewhat difficult  Some recent data might be hidden  A/P: Worsening control-particularly with noted worsening/increased frequency of depressed mood -Similar difficulties with orgasm on Lexapro but felt like better control of depression -Reducing Pristiq did not help with anorgasmia -We also discussed possible psychiatry referral in regards to side effects -We also discussed potentially testing  testosterone-patient wants to hold off for now-testosterone could cause issues with orgasm/ED and could worsen depression   Trial back on Lexapro 10 mg half of a tablet along with 25 mg of pristiq for one week. Drop Wellbutrin when you start lexapro. After one week, stop taking Pristiq and take a full dose of the Lexapro 10 mg.    Recommended follow up: I asked him to update me in 4 to 6 weeks about depression and 2 to 3 weeks on blood pressure through MyChart Future Appointments  Date Time Provider [place]  01/10/2022  8:50 AM Tomi Likens, Adam R, DO [facility] None   Lab/Order associations:   ICD-10-CM   2. Essential hypertension  I10  3. Recurrent major depressive disorder, in partial remission ([facility])  F33.41       Meds ordered this encounter  Medications   escitalopram (LEXAPRO) 10 MG tablet    Sig: Take 1 tablet (10 mg total) by mouth daily.    Dispense:  90 tablet    Refill:  3    Return precautions advised.  [name], MD

## 2021-04-01 NOTE — Patient Instructions (Addendum)
Take half tablet of lexapro 10 mg along with 25 mg of pristiq for 1 week. Drop wellbutrin when you start the lexapro.   After 1 week, go to full tablet of lexapro 10 mg and stop the pristiq.   Team please give him a phq9 to fill out- he can take a picture of this and upload on mychart in 5-6 weeks  Due to orthostatic hypotension, reduce chlorthalidone to half tablet and update me in 2-3 weeks  Due to your numbness in you right wrist, try wearing a cock up wrist splint and update me with results.can refer to sports medicine if needed  Team please give him a carpal tunnel handout from sports med advisor  Recommended follow up: schedule for when you will be back in town- certainly keep me updated on the depression though through mychart    Taking the medicine as directed and not missing any doses is one of the best things you can do to treat your depression.  Here are some things to keep in mind:  Side effects (stomach upset, some increased anxiety) may happen before you notice a benefit.  These side effects typically go away over time. Changes to your dose of medicine or a change in medication all together is sometimes necessary Most people need to be on medication at least 6-12 months Many people will notice an improvement within two weeks but the full effect of the medication can take up to 4-6 weeks Stopping the medication when you start feeling better often results in a return of symptoms If you start having thoughts of hurting yourself or others after starting this medicine, call our office immediately at [phone] or seek care through 911.

## 2021-04-01 NOTE — Progress Notes (Addendum)
Phone: [phone]   Subjective:  Patient presents today for their annual physical. Chief complaint-noted.   See problem oriented charting- ROS- full  review of systems was completed and negative  except for: fatigue, congestion, unexpected weight change, hearing loss, mouth sores,post nasal drip, sore throat, tinnitus, eye itching, cough, leg swelling- stable on left, palpitation/irregular heart beats, constipation, rectal pain- with ulcerative colitis, urinary frequency, muscle aches, neck stiffness, seasonal allergies,food allergies, dizziness, headaches, light-headedness, numbness, sad mood, nervous/anxious.  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  Follows with Dr. Fuller Plan of [facility] 05/06/2008    Priority: High   Essential hypertension 03/12/2019    Priority: Medium   Gilbert syndrome 01/26/2018    Priority: Medium   History of adenomatous polyp of colon 05/22/2017    Priority: Medium   PSORIASIS, SCALP 05/03/2010    Priority: Medium   Hyperlipidemia 05/15/2007    Priority: Medium   Migraines 05/03/2007    Priority: Medium   Hyperglycemia 01/26/2018    Priority: Low   Vasovagal syncope 02/14/2017    Priority: Low   Hearing loss 08/04/2016    Priority: Low   Low back pain 02/09/2016    Priority: Low   DOE (dyspnea on exertion) 04/15/2014    Priority: Low   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/SEC LOWER LEG 10/12/2010    Priority: Low   Vitamin D deficiency 10/27/2009    Priority: Low   GERD 01/31/2008    Priority: Low   INTERNAL HEMORRHOIDS 01/23/2008    Priority: Low   Allergic rhinitis 05/03/2007    Priority: Low   Raynaud's disease 11/15/2019   Family history of alpha 1 antitrypsin deficiency 06/14/2018   Past Surgical History:  Procedure Laterality Date   ADENOIDECTOMY     as a child   COLONOSCOPY  2018   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   UPPER GASTROINTESTINAL ENDOSCOPY      Family History  Problem Relation Age of Onset   Thyroid disease Father    Heart disease Father        a fib, expanded aorta   Arthritis Mother    Colon cancer Other        Grandmother's Family (x 1 brother to grandmother and 1 sister to grandmother)   Crohn's disease Paternal Aunt    Crohn's disease Cousin    Colon polyps Maternal Grandmother        [place], Great Uncle   Heart disease Brother    Colon polyps Brother    Esophageal cancer Neg Hx    Stomach cancer Neg Hx    Rectal cancer Neg Hx     Medications- reviewed and updated Current Outpatient Medications  Medication Sig Dispense Refill   ALPRAZolam (XANAX) 0.25 MG tablet  Take 1 tablet (0.25 mg total) by mouth 2 (two) times daily as needed for anxiety. 20 tablet 0   amitriptyline (ELAVIL) 25 MG tablet Take 1 tablet (25 mg total) by mouth at bedtime. 90 tablet 3   balsalazide (COLAZAL) 750 MG capsule TAKE 3 CAPSULES (=2250MG) 3TIMES A DAY. (Patient taking differently: TAKE 3 CAPSULES (=2250MG) 2TIMES A DAY.) 810 capsule 1   cetirizine (ZYRTEC) 10 MG tablet Take 10 mg by mouth daily.     chlorthalidone (HYGROTON) 25 MG tablet TAKE 1 TABLET BY MOUTH EVERY DAY 90 tablet 3   diphenhydrAMINE (BENADRYL) 25 MG tablet Take 25 mg by mouth at bedtime as needed.     escitalopram (LEXAPRO) 10 MG tablet Take 1 tablet (10 mg total) by mouth daily. 90 tablet 3   Famotidine (PEPCID PO) Take by mouth.      fluticasone (FLONASE) 50 MCG/ACT nasal spray USE 1 SPRAY IN EACH NOSTRILTWICE DAILY 48 g 3   Galcanezumab-gnlm (EMGALITY) 120 MG/ML SOAJ Inject 120 mg into the skin every 30 (thirty) days. 3.36 mL 1   ibuprofen (ADVIL,MOTRIN) 200 MG tablet Take 200 mg by mouth as needed.       losartan (COZAAR) 50 MG tablet TAKE 1 TABLET BY MOUTH EVERY DAY 90 tablet 3   Melatonin 5 MG CAPS Take 1 capsule by mouth at bedtime.     mesalamine (CANASA) 1000 MG suppository Place 1 suppository (1,000 mg total) rectally at bedtime. 90 suppository 0   propranolol ER (INDERAL LA) 60 MG 24 hr capsule TAKE 1 CAPSULE BY MOUTH EVERY DAY 90 capsule 1   SUMAtriptan (IMITREX) 100 MG tablet MAY REPEAT IN 2 HOURS IF HEADACHE PERSISTS OR RECURS. 27 tablet 1   Vitamin D, Cholecalciferol, 50 MCG (2000 UT) CAPS Take 1 capsule by mouth daily.     No current facility-administered medications for this visit.    Allergies-reviewed and updated Allergies  Allergen Reactions   Shellfish Allergy Nausea And Vomiting    scallops   Citrus     Oral pain, rash in mouth   Codeine Nausea Only   Salmon [Fish Allergy] Nausea Only and Rash    Especially smoked salmon    Social History   Social History Narrative   Lives with husband Dwaine Deter also a patient of Dr. Yong Channel). No pets or adopted children or natural children.       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       Hobbies: walking, hiking, reading, music, cooking , gardening.       Right handed.    Objective  Objective:  BP 110/82   Pulse 70   Temp (!) 97.2 F (36.2 C) (Temporal)   Ht 5' 10"  (1.778 m)   Wt 199 lb 12.8 oz (90.6 kg)   SpO2 98%   BMI 28.67 kg/m  Gen: NAD, resting comfortably HEENT: Mucous membranes are moist. Oropharynx largely normal other than fissure on mid tongue (patient agrees to follow-up with dentist on this if worsens or fails to improve), hearing-aids in place bilaterally, nasal turbinates slightly edematous with clear  discharge. Neck: no thyromegaly, no carotid bruits CV: RRR no murmurs rubs or gallops Lungs: CTAB no crackles, wheeze, rhonchi Abdomen: soft/nontender/nondistended/normal bowel sounds. No rebound or guarding.  Ext: trace edema left leg and none on the right Skin: warm, dry Neuro: grossly normal, moves all extremities, PERRLA    Assessment and Plan  60 y.o. male presenting for annual physical.  Health Maintenance counseling:  1. Anticipatory guidance: Patient counseled regarding regular dental exams -q6 months, eye exams -yearly,  avoiding smoking and second hand smoke , limiting alcohol to 2 beverages per day -right at 2.   2. Risk factor reduction:  Advised patient of need for regular exercise and diet rich and fruits and vegetables to reduce risk of heart attack and stroke. Exercise- He started back in 2021 and he is lifting weight 3 times a week. Diet-reasonably healthy-we discussed could help with weight loss reducing alcohol intake to help reduce calories Wt Readings from Last 3 Encounters:  04/01/21 199 lb 12.8 oz (90.6 kg)  01/18/21 195 lb 6.4 oz (88.6 kg)  01/08/21 197 lb 9.6 oz (89.6 kg)  3. Immunizations/screenings/ancillary studies-up-to-date.  We considered Prevnar 20 but opted to wait until age 32 Immunization History  Administered Date(s) Administered   Influenza Whole 07/31/2009, 06/16/2010   Influenza,inj,Quad PF,6+ Mos 06/30/2015, 05/29/2019   Influenza-Unspecified 07/21/2016, 07/10/2017, 07/17/2018, 05/29/2019, 07/15/2020   Moderna Sars-Covid-2 Vaccination 01/13/2021   PFIZER(Purple Top)SARS-COV-2 Vaccination 12/14/2019, 01/04/2020, 07/15/2020   Td 05/09/2006   Tdap 11/01/2013   Zoster Recombinat (Shingrix) 10/15/2018, 03/12/2019   4. Prostate cancer screening-  low risk for prostate cancer on PSA- continue to monitor yearly Lab Results  Component Value Date   PSA 0.49 03/29/2021   PSA 0.30 02/12/2020   PSA 0.39 10/15/2018   5. Colon cancer screening - colonoscopy  on 05/06/2020. History of polyps in the past plus UC.  3-year repeat planned in 2024 6. Skin cancer screening- sees dermatology- has an appointment scheduled soon. advised regular sunscreen use. Denies worrisome, changing, or new skin lesions.  7.  Never smoker. 8. STD screening - monogamous so declined  Status of chronic or acute concerns   #Social Updates- He is leaving for [place] on 05/20/2021.  Will be at [employer] for academic year  #Essential Hypertension See separate problem-oriented note  # Migraines S:medications: Emgality 120 mg/mL, Imitrex 100 mg- follows with Dr. Tomi Likens and reports success. He has them 4-5 per month and he takes Imitrex for it. A/P: Well-controlled-thankful for neurology's help on this-continue follow-up with Dr. Tomi Likens  # GERD S:Medication:  Famotidine at night- not taking much at all or only when he takes Ibuprofen. A/P: Doing well recently-continue to monitor  #hyperlipidemia S: Medication: None. He is going to the gym 3 days per week- reports having possible muscle/weight gain.  ASCVD risk of 6.5% Lab Results  Component Value Date   CHOL 255 (H) 03/29/2021   HDL 69 03/29/2021   LDLCALC 165 (H) 03/29/2021   LDLDIRECT 148.0 10/15/2018   TRIG 101 03/29/2021   CHOLHDL 3.7 03/29/2021   A/P: Mild poor control but with ASCVD risk under 6.5% with improved blood pressure will hold off on medication-recalculate ASCVD risk next year  # Depression/anxiety See problem-oriented charting  #Vitamin D deficiency S: Medication: 2000 units daily- Vitamin D in healthy range. Last vitamin D Lab Results  Component Value Date   VD25OH 43 03/29/2021  A/P:  Stable. Continue current medications.     #Ulcerative Colitis S: medications: Colazal 75 mg, Canasa 1000 mg. He had a flare up in the last month- he says he knows how to handle this. Rectal pain as a secondary from this issue. A/P: Slight flare recently-he states he is tolerating this-we will follow-up with GI  if needed  # Seasonal Allergies S:medications: Zyrtec 10 mg, Flonase 50 MCG/ACT A/P: Constant issue for patient-continue current medication  #Numbness on Right arm- for the last  2 weeks, he is experiencing numbness in his thumb, index, and partially his middle finger and radiates up to his wrist- not fully up towards his forearm. Worse during the day but denies waking up with the numbness. Denies any weakness. His grip strength does not feel as strong. -Discussed cock up wrist splint and carpal tunnel exercises and if not improving sports medicine orthopedic evaluation  #Pain with Kneeling S: When he kneels, he has a very harsh pain- usually in both knees but more frequently on his left knee. It goes away as soon as he starts to stand. Denies any guarding. Uses Voltaren gel and it helps. A/P: Possible mild arthritis-continue to monitor and rub Voltaren gel.  Recommended 4 times a day for 7 days to try to cool this off  # Fissure on tongue S:He noticed it 3 weeks ago. Denies any pain/tenderness.  A/P: Recommend follow-up with dentist if fails to improve or worsens-may need oral surgeon consult  Recommended follow up: No follow-ups on file. Future Appointments  Date Time Provider [place]  01/10/2022  8:50 AM Metta Clines R, DO [facility] None   Lab/Order associations: Not fasting   ICD-10-CM   1. Preventative health care  Z00.00     2. Essential hypertension  I10     3. Recurrent major depressive disorder, in partial remission ([place])  F33.41       Meds ordered this encounter  Medications   escitalopram (LEXAPRO) 10 MG tablet    Sig: Take 1 tablet (10 mg total) by mouth daily.    Dispense:  90 tablet    Refill:  3   I,Harris Phan,acting as a scribe for [name], MD.,have documented all relevant documentation on the behalf of [name], MD,as directed by  [name], MD while in the presence of [name], MD.   I, [name], MD, have reviewed all  documentation for this visit. The documentation on 04/01/21 for the exam, diagnosis, procedures, and orders are all accurate and complete.     Return precautions advised.  [name], MD

## 2021-04-01 NOTE — Assessment & Plan Note (Signed)
S: Medication: Wellbutrin 150 mg extended release, Xanax 0.25 mg, Pristiq 25 mg, Elavil 25 mg -He is having depressive episodes more recently than normal- usually either brief moments or for long periods of time (while driving, sitting at home). Pristiq does not seem to help him. He does want to go back on Lexapro- similar side effects to Pristiq but states that results were better while he was taking it. Sadder now after the change in medications. - he does not want to be sad pr have sexual side effects ultimately Depression screen [facility] 2/9 04/01/2021 01/18/2021 12/11/2020  Decreased Interest 1 1 1   Down, Depressed, Hopeless 3 1 3   PHQ - 2 Score 4 2 4   Altered sleeping 0 1 1  Tired, decreased energy 1 1 2   Change in appetite 0 0 0  Feeling bad or failure about yourself  0 0 2  Trouble concentrating 0 0 2  Moving slowly or fidgety/restless 0 0 0  Suicidal thoughts 0 0 0  PHQ-9 Score 5 4 11   Difficult doing work/chores Somewhat difficult Somewhat difficult Somewhat difficult  Some recent data might be hidden  A/P: Worsening control-particularly with noted worsening/increased frequency of depressed mood -Similar difficulties with orgasm on Lexapro but felt like better control of depression -Reducing Pristiq did not help with anorgasmia -We also discussed possible psychiatry referral in regards to side effects -We also discussed potentially testing testosterone-patient wants to hold off for now-testosterone could cause issues with orgasm/ED and could worsen depression   Trial back on Lexapro 10 mg half of a tablet along with 25 mg of pristiq for one week. Drop Wellbutrin when you start lexapro. After one week, stop taking Pristiq and take a full dose of the Lexapro 10 mg.

## 2021-04-19 ENCOUNTER — Encounter: Payer: Self-pay | Admitting: Family Medicine

## 2021-04-24 ENCOUNTER — Other Ambulatory Visit: Payer: Self-pay | Admitting: Family Medicine

## 2021-05-09 ENCOUNTER — Other Ambulatory Visit: Payer: Self-pay | Admitting: Neurology

## 2021-06-10 ENCOUNTER — Other Ambulatory Visit: Payer: Self-pay | Admitting: Family Medicine

## 2021-06-25 ENCOUNTER — Other Ambulatory Visit: Payer: Self-pay | Admitting: Family Medicine

## 2021-06-25 ENCOUNTER — Encounter: Payer: Self-pay | Admitting: Family Medicine

## 2021-06-25 ENCOUNTER — Other Ambulatory Visit: Payer: Self-pay

## 2021-06-25 MED ORDER — SUMATRIPTAN SUCCINATE 100 MG PO TABS: ORAL_TABLET | ORAL | 1 refills | Status: DC

## 2021-06-25 MED ORDER — PROPRANOLOL HCL ER 60 MG PO CP24: ORAL_CAPSULE | ORAL | 2 refills | Status: DC

## 2021-06-28 ENCOUNTER — Other Ambulatory Visit: Payer: Self-pay

## 2021-07-08 ENCOUNTER — Encounter: Payer: Self-pay | Admitting: Family Medicine

## 2021-07-08 ENCOUNTER — Other Ambulatory Visit: Payer: Self-pay

## 2021-07-08 MED ORDER — LOSARTAN POTASSIUM 50 MG PO TABS: 50.0000 mg | ORAL_TABLET | Freq: Every day | ORAL | 3 refills | Status: DC

## 2021-07-15 ENCOUNTER — Other Ambulatory Visit: Payer: Self-pay | Admitting: Family Medicine

## 2021-07-18 ENCOUNTER — Other Ambulatory Visit: Payer: Self-pay | Admitting: Family Medicine

## 2021-07-26 NOTE — Progress Notes (Signed)
Phone [phone] Virtual visit via Video note   Subjective:  Chief complaint: Chief Complaint  Patient presents with   Medication Problem    Sex issues, excessive sleepiness & weight gain. Currently weighs 200-205lbs.   BP better & depression also better.    This visit type was conducted due to national recommendations for restrictions regarding the COVID-19 Pandemic (e.g. social distancing).  This format is felt to be most appropriate for this patient at this time balancing risks to patient and risks to population by having him in for in person visit.  No physical exam was performed (except for noted visual exam or audio findings with Telehealth visits).    Our team/I connected with [name] at  4:00 PM EDT by a video enabled telemedicine application ([email or web address] or caregility through epic) and verified that I am speaking with the correct person using two identifiers.  Location patient: At his current home Location provider: [facility], office Persons participating in the virtual visit:  patient  Our team/I discussed the limitations of evaluation and management by telemedicine and the availability of in person appointments. In light of current covid-19 pandemic, patient also understands that we are trying to protect them by minimizing in office contact if at all possible.  The patient expressed consent for telemedicine visit and agreed to proceed. Patient understands insurance will be billed.   Past Medical History-  Patient Active Problem List   Diagnosis Date Noted   Depression, major, recurrent ([place]) 08/16/2018    Priority: 1.   ULCERATIVE COLITIS-LEFT SIDE.  Follows with Dr. Fuller Plan of [facility] 05/06/2008    Priority: 1.   Raynaud's disease 11/15/2019    Priority: 2.   Essential hypertension 03/12/2019    Priority: 2.   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   History of adenomatous polyp of colon 05/22/2017    Priority: 2.   PSORIASIS, SCALP 05/03/2010     Priority: 2.   Hyperlipidemia 05/15/2007    Priority: 2.   Migraines 05/03/2007    Priority: 2.   Hyperglycemia 01/26/2018    Priority: 3.   Vasovagal syncope 02/14/2017    Priority: 3.   Hearing loss 08/04/2016    Priority: 3.   Low back pain 02/09/2016    Priority: 3.   DOE (dyspnea on exertion) 04/15/2014    Priority: 3.   LOC 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 LEG 10/12/2010    Priority: 3.   Vitamin D deficiency 10/27/2009    Priority: 3.   GERD 01/31/2008    Priority: 3.   INTERNAL HEMORRHOIDS 01/23/2008    Priority: 3.   Allergic rhinitis 05/03/2007    Priority: 3.   Family history of alpha 1 antitrypsin deficiency 06/14/2018    Medications- reviewed and updated Current Outpatient Medications  Medication Sig Dispense Refill   amitriptyline (ELAVIL) 25 MG tablet Take 1 tablet (25 mg total) by mouth at bedtime. 90 tablet 3   balsalazide (COLAZAL) 750 MG capsule TAKE 3 CAPSULES (=2250MG) 3TIMES A DAY. (Patient taking differently: TAKE 3 CAPSULES (=2250MG) 2TIMES A DAY.) 810 capsule 1   cetirizine (ZYRTEC) 10 MG tablet Take 10 mg by mouth daily.     chlorthalidone (HYGROTON) 25 MG tablet TAKE 1 TABLET BY MOUTH EVERY DAY (Patient taking differently: Take 12.5 mg by mouth daily.) 90 tablet 3   diphenhydrAMINE (BENADRYL) 25 MG tablet Take 25 mg by mouth at bedtime as needed.     EMGALITY 120 MG/ML SOAJ INJECT 120  MG INTO THE SKIN EVERY 30 (THIRTY) DAYS 1 mL 4   famotidine (ZANTAC 360 MAX ST) 20 MG tablet Take 20 mg by mouth daily as needed for heartburn or indigestion.     fluticasone (FLONASE) 50 MCG/ACT nasal spray USE 1 SPRAY IN EACH NOSTRILTWICE DAILY 48 g 3   ibuprofen (ADVIL,MOTRIN) 200 MG tablet Take 200 mg by mouth as needed.       losartan (COZAAR) 50 MG tablet TAKE 1 TABLET BY MOUTH EVERY DAY 90 tablet 3   Melatonin 5 MG CAPS Take 1 capsule by mouth at bedtime.     mesalamine (CANASA) 1000 MG suppository Place 1 suppository (1,000 mg total) rectally at bedtime.  90 suppository 0   metoprolol succinate (TOPROL-XL) 25 MG 24 hr tablet Take 1 tablet (25 mg total) by mouth daily. 90 tablet 3   sildenafil (REVATIO) 20 MG tablet Take 2-5 tablets as needed once every 48 hours for erectile dysfunction 50 tablet 3   SUMAtriptan (IMITREX) 100 MG tablet TAKE 1 TABLET EVERY 2 HOURSAS NEEDED FOR MIGRAINE 9 tablet 0   Vitamin D, Cholecalciferol, 50 MCG (2000 UT) CAPS Take 1 capsule by mouth daily.     ALPRAZolam (XANAX) 0.25 MG tablet Take 1 tablet (0.25 mg total) by mouth 2 (two) times daily as needed for anxiety. (Patient not taking: Reported on 08/02/2021) 20 tablet 0   escitalopram (LEXAPRO) 5 MG tablet Take 1 tablet (5 mg total) by mouth daily. 90 tablet 3   No current facility-administered medications for this visit.     Objective:  BP 122/88   Pulse 86   Temp 98.3 F (36.8 C) (Oral)   Ht 5' 10"  (1.778 m)   BMI 28.67 kg/m  self reported vitals Gen: NAD, resting comfortably Lungs: nonlabored, normal respiratory rate  Skin: appears dry, no obvious rash     Assessment and Plan   Main concerns-medication adjustment to help with anorgasmia, feels like sleeping more than he should including 8 hours at night and then 1 hour nap in the daytime, also reports ongoing weight gain with Lexapro  #Essential Hypertension S: medication: losartan 50 mg every day, propranolol er 60 mg every day, chlorthalidone 12.5 mg for most part- sometimes 25 if more swelling Home readings #s: readings consistently 115-125/70s and up to 85- slightly higher today BP Readings from Last 3 Encounters:  08/02/21 122/88  04/01/21 110/82  01/18/21 126/84  A/P: Blood pressure is well controlled.  We are going to continue losartan 50 mg and chlorthalidone 12.5 mg.  Patient wants to trial metoprolol 25 mg extended release instead of propranolol extended release 60 mg to see if it helps with any of his main concerns today.   # Migraines S:medications: Emgality 120 mg/mL every 30 days,  Imitrex 100 mg as needed- followed with Dr. Tomi Likens and reported success.  Also on amitriptyline 25 mg.  Patient also on propranolol which may be helpful but on other hand on this for nighttime palpitations-see prior heart monitor A/P: Migraines are well controlled in regards to patient's typical baseline-it certainly may progress over time.  He would like to see if he can reduce amitriptyline but Dr. Tomi Likens had increased to 25 mg I am hesitant to do this without his input.  For now we will continue current medication except adjusting from propranolol 60 mg extended release to metoprolol 25 mg extended release-if patient is doing well on this dose with well-controlled blood pressure no worsening of migraines he can even try half  tablet   # Depression/anxiety S: Medication: Lexapro 10 mg.  Prior Wellbutrin attempt was not beneficial nor was Pristiq overall. -usually if down now there is a clear obvious cause- looking at phased retirement -getting 8 hours of sleep and then 1 hour nap -still with anorgasmia  Depression screen [facility] 2/9 08/02/2021 04/01/2021 01/18/2021  Decreased Interest 0 1 1  Down, Depressed, Hopeless 1 3 1   PHQ - 2 Score 1 4 2   Altered sleeping 0 0 1  Tired, decreased energy 0 1 1  Change in appetite 0 0 0  Feeling bad or failure about yourself  0 0 0  Trouble concentrating 0 0 0  Moving slowly or fidgety/restless 0 0 0  Suicidal thoughts 0 0 0  PHQ-9 Score 1 5 4   Difficult doing work/chores Not difficult at all Somewhat difficult Somewhat difficult  Some recent data might be hidden  A/P: Depression appears to be in full remission.  Patient would like to trial 5 mg dose and see if it can maintain control but also help with anorgasmia and potentially his need for increased sleep of 9 hours/day. -Need to double check next visit and make sure no snoring-May need sleep study  #ED/anorgasmia -sildenafil 20 mg trial-looks like there is at least one study suggesting potential benefit for  anorgasmia  #palpitations- propranolol starting resolved this. Most of this was happening at night-we are hopeful still controlled off of propranolol and onto metoprolol as above -Patient wonders if propranolol and Lexapro and amitriptyline contributing to weight gain as well   Recommended follow up:  Future Appointments  Date Time Provider [place]  01/10/2022  8:50 AM Tomi Likens, Adam R, DO [facility] None    Lab/Order associations:   ICD-10-CM   1. Essential hypertension  I10     2. Recurrent major depressive disorder, in full remission ([place])  F33.42     3. Intractable migraine with aura without status migrainosus  G43.119       Meds ordered this encounter  Medications   metoprolol succinate (TOPROL-XL) 25 MG 24 hr tablet    Sig: Take 1 tablet (25 mg total) by mouth daily.    Dispense:  90 tablet    Refill:  3   escitalopram (LEXAPRO) 5 MG tablet    Sig: Take 1 tablet (5 mg total) by mouth daily.    Dispense:  90 tablet    Refill:  3   sildenafil (REVATIO) 20 MG tablet    Sig: Take 2-5 tablets as needed once every 48 hours for erectile dysfunction    Dispense:  50 tablet    Refill:  3   I,Jada Bradford,acting as a scribe for [name], MD.,have documented all relevant documentation on the behalf of [name], MD,as directed by  [name], MD while in the presence of [name], MD.   I, [name], MD, have reviewed all documentation for this visit. The documentation on 08/02/21 for the exam, diagnosis, procedures, and orders are all accurate and complete.   Return precautions advised.  [name], MD

## 2021-08-02 ENCOUNTER — Other Ambulatory Visit: Payer: Self-pay | Admitting: Neurology

## 2021-08-02 ENCOUNTER — Telehealth: Payer: BC Managed Care – PPO | Admitting: Family Medicine

## 2021-08-02 ENCOUNTER — Encounter: Payer: Self-pay | Admitting: Family Medicine

## 2021-08-02 VITALS — BP 122/88 | HR 86 | Temp 98.3°F | Ht 70.0 in

## 2021-08-02 DIAGNOSIS — G43119 Migraine with aura, intractable, without status migrainosus: Secondary | ICD-10-CM | POA: Diagnosis not present

## 2021-08-02 DIAGNOSIS — K219 Gastro-esophageal reflux disease without esophagitis: Secondary | ICD-10-CM

## 2021-08-02 DIAGNOSIS — I10 Essential (primary) hypertension: Secondary | ICD-10-CM | POA: Diagnosis not present

## 2021-08-02 DIAGNOSIS — F3342 Major depressive disorder, recurrent, in full remission: Secondary | ICD-10-CM

## 2021-08-02 DIAGNOSIS — E785 Hyperlipidemia, unspecified: Secondary | ICD-10-CM

## 2021-08-02 DIAGNOSIS — F3341 Major depressive disorder, recurrent, in partial remission: Secondary | ICD-10-CM

## 2021-08-02 MED ORDER — SILDENAFIL CITRATE 20 MG PO TABS: ORAL_TABLET | ORAL | 3 refills | Status: DC

## 2021-08-02 MED ORDER — METOPROLOL SUCCINATE ER 25 MG PO TB24: 25.0000 mg | ORAL_TABLET | Freq: Every day | ORAL | 3 refills | Status: DC

## 2021-08-02 MED ORDER — ESCITALOPRAM OXALATE 5 MG PO TABS: 5.0000 mg | ORAL_TABLET | Freq: Every day | ORAL | 3 refills | Status: DC

## 2021-08-04 ENCOUNTER — Encounter: Payer: Self-pay | Admitting: Family Medicine

## 2021-08-05 MED ORDER — SILDENAFIL CITRATE 100 MG PO TABS: 100.0000 mg | ORAL_TABLET | Freq: Every day | ORAL | 3 refills | Status: DC | PRN

## 2021-08-11 ENCOUNTER — Other Ambulatory Visit: Payer: Self-pay | Admitting: Family Medicine

## 2021-08-11 MED ORDER — SILDENAFIL CITRATE 100 MG PO TABS: 100.0000 mg | ORAL_TABLET | Freq: Every day | ORAL | 3 refills | Status: DC | PRN

## 2021-08-11 NOTE — Addendum Note (Signed)
Addended by: Armandina Gemma L on: 08/11/2021 10:20 AM   Modules accepted: Orders

## 2021-08-12 ENCOUNTER — Other Ambulatory Visit: Payer: Self-pay | Admitting: Family Medicine

## 2021-08-14 ENCOUNTER — Other Ambulatory Visit: Payer: Self-pay | Admitting: Family Medicine

## 2021-08-16 NOTE — Telephone Encounter (Signed)
Cialis not covered.

## 2021-08-21 ENCOUNTER — Other Ambulatory Visit: Payer: Self-pay | Admitting: Family Medicine

## 2021-09-13 ENCOUNTER — Other Ambulatory Visit: Payer: Self-pay | Admitting: Family Medicine

## 2021-09-14 ENCOUNTER — Encounter: Payer: Self-pay | Admitting: Family Medicine

## 2021-09-15 ENCOUNTER — Encounter: Payer: Self-pay | Admitting: Family Medicine

## 2021-09-16 NOTE — Telephone Encounter (Signed)
Patient has called in regard to being seen virtual.  Patient is currently in [place].  [place] has cancelled telemed visit's for outside providers as of 06/09/21.  Patient states he is leaving to go out of the country and would need treatment soon.  States he has cipro and can take at Dr. Ansel Bong decreation.  I have informed patient that we would have to send a message back and that I could not guarantee how fast of a response.  I informed patient if he was looking for treatment and medical advise for today to go ahead and seek treatment where he is located.  Patient states he will seek treatment in [place].

## 2021-10-27 ENCOUNTER — Other Ambulatory Visit: Payer: Self-pay

## 2021-10-27 ENCOUNTER — Encounter: Payer: Self-pay | Admitting: Family Medicine

## 2021-10-27 MED ORDER — CHLORTHALIDONE 25 MG PO TABS: 25.0000 mg | ORAL_TABLET | Freq: Every day | ORAL | 3 refills | Status: DC

## 2021-11-30 ENCOUNTER — Other Ambulatory Visit: Payer: Self-pay

## 2021-11-30 ENCOUNTER — Encounter: Payer: Self-pay | Admitting: Family Medicine

## 2021-11-30 DIAGNOSIS — Z125 Encounter for screening for malignant neoplasm of prostate: Secondary | ICD-10-CM

## 2021-11-30 DIAGNOSIS — E559 Vitamin D deficiency, unspecified: Secondary | ICD-10-CM

## 2021-11-30 DIAGNOSIS — I10 Essential (primary) hypertension: Secondary | ICD-10-CM

## 2021-11-30 DIAGNOSIS — Z0000 Encounter for general adult medical examination without abnormal findings: Secondary | ICD-10-CM

## 2021-11-30 DIAGNOSIS — E785 Hyperlipidemia, unspecified: Secondary | ICD-10-CM

## 2021-12-22 ENCOUNTER — Other Ambulatory Visit: Payer: Self-pay | Admitting: Family Medicine

## 2022-01-07 NOTE — Progress Notes (Signed)
? ?Virtual Visit via Video Note ?The purpose of this virtual visit is to provide medical care while limiting exposure to the novel coronavirus.   ? ?Consent was obtained for video visit:  Yes.   ?Answered questions that patient had about telehealth interaction:  Yes.   ?I discussed the limitations, risks, security and privacy concerns of performing an evaluation and management service by telemedicine. I also discussed with the patient that there may be a patient responsible charge related to this service. The patient expressed understanding and agreed to proceed. ? ?Pt location: Home ?Physician Location: office ?Name of referring provider:  Marin Olp, MD ?I connected with [name] at patients initiation/request on 01/10/2022 at  8:50 AM EDT by video enabled telemedicine application and verified that I am speaking with the correct person using two identifiers. ?Pt MRN:  [id number] ?Pt DOB:  19-Jun-1961 ?Video Participants:  [name] ? ?Assessment and Plan:   ?Migraine without aura, without status migrainosus, not intractable ?  ?1.  Migraine prevention:  Emgality.  Also on amitriptyline (for sleep).  Due to slight increased migraine frequency, suggested adding zonisamide.  He plans to taper off of Lexapro.  Once he is off of Lexapro and if migraines do not improve, he will reach out to me about adding zonidamide. ?2.  Migraine rescue:  Sumatriptan 138m ?3.  Limit use of pain relievers to no more than 2 days out of week to prevent risk of rebound or medication-overuse headache. ?4.  Keep headache diary ?5.  Follow up one year ? ?History of Present Illness:  ?[name] a 61year old Caucasian man with depression, anxiety and Gilbert syndrome who follows up for migraines and possible left-sided trigeminal neuralgia. ?  ?UPDATE: ?No trigeminal neuralgia since last visit. ? ?Intensity:  Moderate (no severe) ?Duration:  1 hour.   ?Frequency:  6-7 a month (4-5 last visit) ?  ?Current NSAIDS:   none ?Current analgesics:  Sumatriptan 569m?Current triptans:  none ?Current ergotamine:  none ?Current anti-emetic: Promethazine 12.5 mg ?Current muscle relaxants: none ?Current anti-anxiolytic:  none ?Current sleep aide: melatonin  ?Current Antihypertensive medications: metoprolol succinate 2518mHS; losartan ?Current Antidepressant medications: amitriptyline 39m38mS, escitalopram 5mg 26mly ?Current Anticonvulsant medications:  none ?Current anti-CGRP:  Emgality ?Current Vitamins/Herbal/Supplements: Vitamin D, melatonin ?Current Antihistamines/Decongestants: Benadryl, Zyrtec ?Other therapy:  none ?Other medications:  none ?  ?Caffeine:  1 mug of coffee daily  ?Diet:  Drinks water ?Exercise:  yes ?Depression:  yes; Anxiety: yes - started Pristiq in February and now improved. ?Other pain:  Muscle aches ?Sleep hygiene:  Well ?  ?HISTORY:  ?Onset:  Childhood and then since late 20s ?Location:  Unilateral (either side)  left behind eye radiating to back of head ?Quality:  pressure ?Initial Intensity:  7/10.  He denies new headache, thunderclap headache.  Sometimes wakes up with them. ?Aura:  On rare occasions had jagged lines in vision ?Premonitory Phase:  Mild numb sensation, flushing of ear on side of headache ?Postdrome:  A day he has a fatigue sensation ?Associated symptoms:  Photophobia, phonophobia, osmophobia, nausea.  He denies associated unilateral numbness or weakness. ?Initial Duration:  5-6 hours (if takes sumatriptan immediately, then 1 hour) ?Initial Frequency:  10 to 15 days ?Initial Frequency of abortive medication: at least 10 days a month ?Triggers:  Some strong smells,chocolate or red wine, often after a stressful event, exposed to direct sunlight ?Relieving factors:  Sumatriptan ?Activity:  aggravates ?  ?In March 2020, he had a new migraine  associated with aura.  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  He has had one subsequent event.    ?  ?In October-November 2020, He had an episode of possible left-sided trigeminal neuralgia.  He described a spasm on side of his left eye radiating to the cheek.  It was an electric shock.  It lasted a second and occurred 3 times.  Eye exam was fine.  ?  ?Past NSAIDS:  Ibuprofen, naproxen ?Past analgesics:  Tylenol ?Past abortive triptans: rizatriptan, eletriptan? ?Past abortive ergotamine:  none ?Past muscle relaxants:  none ?Past anti-emetic:  none ?Past antihypertensive medications:  propranolol (caused fatigue and bradycardia) ?Past antidepressant medications:  doxepin ?Past anticonvulsant medications:  none ?Past anti-CGRP:  Nurtec (effective but comparable to sumatriptan in efficacy)  ?Past vitamins/Herbal/Supplements:  none ?Past antihistamines/decongestants:  none ?Other past therapies:  none ?  ?Family history of headache:  Father, niece  ? ?Past Medical History: ?Past Medical History:  ?Diagnosis Date  ? Allergic rhinitis   ? Allergy   ? seasonal  ? Arthritis   ? GERD (gastroesophageal reflux disease)   ? [name] syndrome   ? Heart murmur   ? tricuspid valve regurgitation  ? Hyperlipidemia   ? Hypertension   ? Internal hemorrhoids   ? Migraines   ? Palpitations   ? Panic attack   ? occasional  ? Syncope   ? on airplane in 12/2016/ due to stress  ? 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 ? ?Medications: ?Outpatient Encounter Medications as of 01/10/2022  ?Medication Sig  ? ALPRAZolam (XANAX) 0.25 MG tablet Take 1 tablet (0.25 mg total) by mouth 2 (two) times daily as needed for anxiety. (Patient not taking: Reported on 08/02/2021)  ? amitriptyline (ELAVIL) 25 MG tablet TAKE 1 TABLET AT BEDTIME  ? balsalazide (COLAZAL) 750 MG capsule TAKE 3 CAPSULES 3 TIMES    DAILY  ? cetirizine (ZYRTEC) 10 MG tablet Take 10 mg by mouth daily.  ? chlorthalidone (HYGROTON) 25 MG tablet Take 1 tablet (25 mg total) by mouth daily.  ? diphenhydrAMINE (BENADRYL) 25 MG tablet Take 25 mg by mouth at bedtime as needed.  ?  EMGALITY 120 MG/ML SOAJ INJECT 120 MG INTO THE SKIN EVERY 30 (THIRTY) DAYS  ? escitalopram (LEXAPRO) 5 MG tablet Take 1 tablet (5 mg total) by mouth daily.  ? famotidine (ZANTAC 360 MAX ST) 20 MG tablet Take 20 mg by mouth daily as needed for heartburn or indigestion.  ? fluticasone (FLONASE) 50 MCG/ACT nasal spray USE 1 SPRAY IN EACH NOSTRILTWICE DAILY  ? ibuprofen (ADVIL,MOTRIN) 200 MG tablet Take 200 mg by mouth as needed.    ? losartan (COZAAR) 50 MG tablet TAKE 1 TABLET BY MOUTH EVERY DAY  ? Melatonin 5 MG CAPS Take 1 capsule by mouth at bedtime.  ? mesalamine (CANASA) 1000 MG suppository Place 1 suppository (1,000 mg total) rectally at bedtime.  ? metoprolol succinate (TOPROL-XL) 25 MG 24 hr tablet Take 1 tablet (25 mg total) by mouth daily.  ? sildenafil (VIAGRA) 100 MG tablet Take 1 tablet (100 mg total) by mouth daily as needed for erectile dysfunction. 90 day supply  ? SUMAtriptan (IMITREX) 100 MG tablet TAKE 1 TABLET EVERY 2 HOURSAS NEEDED FOR MIGRAINE  ? Vitamin D, Cholecalciferol, 50 MCG (2000 UT) CAPS Take 1 capsule by mouth daily.  ? [DISCONTINUED] glycopyrrolate (ROBINUL) 2 MG tablet Take 2 mg by mouth 2 (two) times daily.    ? ?No  facility-administered encounter medications on file as of 01/10/2022.  ? ? ?Allergies: ?Allergies  ?Allergen Reactions  ? Shellfish Allergy Nausea And Vomiting  ?  scallops  ? Citrus   ?  Oral pain, rash in mouth  ? Codeine Nausea Only  ? Salmon [Fish Allergy] Nausea Only and Rash  ?  Especially smoked salmon  ? ? ?Family History: ?Family History  ?Problem Relation Age of Onset  ? Thyroid disease Father   ? Heart disease Father   ?     a fib, expanded aorta  ? Arthritis Mother   ? Colon cancer Other   ?     Grandmother's Family (x 1 brother to grandmother and 1 sister to grandmother)  ? Crohn's disease Paternal Aunt   ? Crohn's disease Cousin   ? Colon polyps Maternal Grandmother   ?     Margot Chimes, [place] Uncle  ? Heart disease Brother   ? Colon polyps Brother   ? Esophageal  cancer Neg Hx   ? Stomach cancer Neg Hx   ? Rectal cancer Neg Hx   ? ? ?Observations/Objective:   ?No acute distress.  Alert and oriented.  Speech fluent and not dysarthric.  Language intact.   ? ? ?Follow Up Inst

## 2022-01-10 ENCOUNTER — Encounter: Payer: Self-pay | Admitting: Neurology

## 2022-01-10 ENCOUNTER — Telehealth (INDEPENDENT_AMBULATORY_CARE_PROVIDER_SITE_OTHER): Payer: BC Managed Care – PPO | Admitting: Neurology

## 2022-01-10 VITALS — Ht 70.0 in | Wt 200.0 lb

## 2022-01-10 DIAGNOSIS — G43009 Migraine without aura, not intractable, without status migrainosus: Secondary | ICD-10-CM

## 2022-01-10 MED ORDER — EMGALITY 120 MG/ML ~~LOC~~ SOAJ: 120.0000 mg | SUBCUTANEOUS | 4 refills | Status: DC

## 2022-01-10 MED ORDER — EMGALITY 120 MG/ML ~~LOC~~ SOAJ
120.0000 mg | SUBCUTANEOUS | 4 refills | Status: DC
Start: 2022-01-10 — End: 2022-01-10

## 2022-01-10 MED ORDER — SUMATRIPTAN SUCCINATE 100 MG PO TABS: ORAL_TABLET | ORAL | 8 refills | Status: DC

## 2022-01-10 NOTE — Patient Instructions (Signed)
Continue emgality.  Once you are off Lexapro and migraines do not improve, contact me and we can start zonisamide as an add-on to [employer] ?Sumatriptan as needed.  Limit use of pain relievers to no more than 2 days out of week to prevent risk of rebound or medication-overuse headache. ?Keep headache diary ?Follow up 9 months. ?

## 2022-01-11 ENCOUNTER — Encounter: Payer: Self-pay | Admitting: Family Medicine

## 2022-01-11 ENCOUNTER — Telehealth (INDEPENDENT_AMBULATORY_CARE_PROVIDER_SITE_OTHER): Payer: BC Managed Care – PPO | Admitting: Family Medicine

## 2022-01-11 VITALS — Ht 70.0 in | Wt 200.0 lb

## 2022-01-11 DIAGNOSIS — I10 Essential (primary) hypertension: Secondary | ICD-10-CM

## 2022-01-11 DIAGNOSIS — F3342 Major depressive disorder, recurrent, in full remission: Secondary | ICD-10-CM

## 2022-01-11 NOTE — Progress Notes (Signed)
?Phone [phone] ?Virtual visit via Video note ?  ?Subjective:  ?Chief complaint: ?Chief Complaint  ?Patient presents with  ? discuss medication  ?  Pt would like to discuss options of getting off of lexapro.  ? ? ?This visit type was conducted due to national recommendations for restrictions regarding the COVID-19 Pandemic (e.g. social distancing).  This format is felt to be most appropriate for this patient at this time balancing risks to patient and risks to population by having him in for in person visit.  No physical exam was performed (except for noted visual exam or audio findings with Telehealth visits).   ? ?Our team/I connected with [name] at 10:20 AM EDT by a video enabled telemedicine application (doxy.me or caregility through epic) and verified that I am speaking with the correct person using two identifiers.  ?Location patient: Home-[place] ?Location provider: [facility], office ?Persons participating in the virtual visit:  patient ? ?Our team/I discussed the limitations of evaluation and management by telemedicine and the availability of in person appointments. In light of current covid-19 pandemic, patient also understands that we are trying to protect them by minimizing in office contact if at all possible.  The patient expressed consent for telemedicine visit and agreed to proceed. Patient understands insurance will be billed.  ? ?Past Medical History-  ?Patient Active Problem List  ? Diagnosis Date Noted  ? Depression, major, recurrent ([place]) 08/16/2018  ?  Priority: High  ? ULCERATIVE COLITIS-LEFT SIDE.  Follows with Dr. Fuller [facility] 05/06/2008  ?  Priority: High  ? Raynaud's disease 11/15/2019  ?  Priority: Medium   ? Essential hypertension 03/12/2019  ?  Priority: Medium   ? 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  Priority: Medium   ? History of adenomatous polyp of colon 05/22/2017  ?  Priority: Medium   ? PSORIASIS, SCALP 05/03/2010  ?  Priority: Medium   ? Hyperlipidemia 05/15/2007  ?   Priority: Medium   ? Migraines 05/03/2007  ?  Priority: Medium   ? Hyperglycemia 01/26/2018  ?  Priority: Low  ? Vasovagal syncope 02/14/2017  ?  Priority: Low  ? Hearing loss 08/04/2016  ?  Priority: Low  ? Low back pain 02/09/2016  ?  Priority: Low  ? DOE (dyspnea on exertion) 04/15/2014  ?  Priority: Low  ? LOC 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 LEG 10/12/2010  ?  Priority: Low  ? Vitamin D deficiency 10/27/2009  ?  Priority: Low  ? GERD 01/31/2008  ?  Priority: Low  ? INTERNAL HEMORRHOIDS 01/23/2008  ?  Priority: Low  ? Allergic rhinitis 05/03/2007  ?  Priority: Low  ? Family history of alpha 1 antitrypsin deficiency 06/14/2018  ? ? ?Medications- reviewed and updated ?Current Outpatient Medications  ?Medication Sig Dispense Refill  ? ALPRAZolam (XANAX) 0.25 MG tablet Take 1 tablet (0.25 mg total) by mouth 2 (two) times daily as needed for anxiety. (Patient not taking: Reported on 01/10/2022) 20 tablet 0  ? amitriptyline (ELAVIL) 25 MG tablet TAKE 1 TABLET AT BEDTIME 90 tablet 3  ? balsalazide (COLAZAL) 750 MG capsule TAKE 3 CAPSULES 3 TIMES    DAILY 810 capsule 1  ? cetirizine (ZYRTEC) 10 MG tablet Take 10 mg by mouth daily.    ? chlorthalidone (HYGROTON) 25 MG tablet Take 1 tablet (25 mg total) by mouth daily. (Patient taking differently: Take 12.5 mg by mouth daily.) 90 tablet 3  ? diphenhydrAMINE (BENADRYL) 25 MG tablet Take 25 mg by  mouth at bedtime as needed.    ? escitalopram (LEXAPRO) 5 MG tablet Take 1 tablet (5 mg total) by mouth daily. 90 tablet 3  ? famotidine (PEPCID) 20 MG tablet Take 20 mg by mouth daily as needed for heartburn or indigestion.    ? fluticasone (FLONASE) 50 MCG/ACT nasal spray USE 1 SPRAY IN EACH NOSTRILTWICE DAILY 48 g 3  ? Galcanezumab-gnlm (EMGALITY) 120 MG/ML SOAJ Inject 120 mg into the skin every 30 (thirty) days. 1 mL 4  ? ibuprofen (ADVIL,MOTRIN) 200 MG tablet Take 200 mg by mouth as needed.      ? losartan (COZAAR) 50 MG tablet TAKE 1 TABLET BY MOUTH EVERY DAY 90 tablet 3  ?  Melatonin 5 MG CAPS Take 1 capsule by mouth at bedtime.    ? metoprolol succinate (TOPROL-XL) 25 MG 24 hr tablet Take 1 tablet (25 mg total) by mouth daily. 90 tablet 3  ? sildenafil (VIAGRA) 100 MG tablet Take 1 tablet (100 mg total) by mouth daily as needed for erectile dysfunction. 90 day supply 30 tablet 3  ? SUMAtriptan (IMITREX) 100 MG tablet TAKE 1 TABLET AS NEEDED FOR MIGRAINE.  MAY REPEAT AFTER 2 HOURS.  MAXIMUM 2 TABLETS IN 24 HOURS 9 tablet 8  ? Vitamin D, Cholecalciferol, 50 MCG (2000 UT) CAPS Take 1 capsule by mouth daily.    ? ?No current facility-administered medications for this visit.  ? ?  ?Objective:  ?Ht 5' 10"  (1.778 m)   Wt 200 lb (90.7 kg)   BMI 28.70 kg/m?  self reported vitals ?Gen: NAD, resting comfortably ?Lungs: nonlabored, normal respiratory rate  ?Skin: appears dry, no obvious rash ? ?  ? ?Assessment and Plan  ? ?#social update- signed up for phased retirement starting in august- 3 years of part time then will be done. Has been enjoying writing - last class will be before 63 ? ?# Depression/anxiety ?S:medication: lexapro 5 mg ?-still with sexual side effects ?-does not feel depressed at this point ?- has been about a year since he has been depressed.   ?-2 episodes in lifetime ?A/P: doing well- with sexual side effects wants to taper off. Will do 2.5 mg for 2-4 weeks then discontinue  ?-generally since depression has been recurrent would continue meds long term but once again side effects very bothersome to him (anorgasmia) so opting to trial off ? ?#hypertension ?S: medication: chlorthalidone 12.5 mg, losartan 50 mg, metoprolol 25 mg daily XR (palpitations primarily) ?Home readings #s: under 135/sometimes close to 90 or right at ?-thinks some increased salt intake ?- exercise has been down- 6-7k steps a day but not at gym- wants to start trainer again.  ?A/P: reasonable control- would prefer to get down to 85 diastolic- wants to add abck in exercise and recheck at June  visit ? ?#Sinus infections- had 2 over the winter- one was able to wait it out with conservative- had another one that was more significant and started antibiotic and was better within 3 days- tends to be left sinus and local doctor he saw recommended he see ENT. Had 2 within 2 months. 2nd infection was more severe.  ?- will check in at august visit or sooner if has recurrent sinus infection and if he does refer to ENT ? ?Recommended follow up:  keep august visit- likely cancel June visit ?Future Appointments  ?Date Time Provider [place]  ?03/14/2022  1:20 PM Marin Olp, MD [facility]  ?05/17/2022  8:15 AM [facility]  ?05/24/2022  1:00 PM Marin Olp, MD [facility]  ?01/11/2023  8:50 AM Pieter Partridge, DO [facility] None  ? ?Lab/Order associations: ?  ICD-10-CM   ?1. Recurrent major depressive disorder, in full remission ([place])  F33.42   ?  ?2. Essential hypertension  I10   ?  ? ?Return precautions advised.  ?[name], MD ? ?

## 2022-01-17 ENCOUNTER — Other Ambulatory Visit: Payer: Self-pay

## 2022-01-17 ENCOUNTER — Encounter: Payer: Self-pay | Admitting: Family Medicine

## 2022-01-17 MED ORDER — LOSARTAN POTASSIUM 50 MG PO TABS: 50.0000 mg | ORAL_TABLET | Freq: Every day | ORAL | 3 refills | Status: DC

## 2022-02-23 ENCOUNTER — Encounter: Payer: Self-pay | Admitting: Neurology

## 2022-02-23 ENCOUNTER — Other Ambulatory Visit: Payer: Self-pay | Admitting: Neurology

## 2022-02-23 MED ORDER — SUMATRIPTAN SUCCINATE 100 MG PO TABS: ORAL_TABLET | ORAL | 5 refills | Status: DC

## 2022-02-23 MED ORDER — ZONISAMIDE 25 MG PO CAPS: ORAL_CAPSULE | ORAL | 0 refills | Status: DC

## 2022-03-04 ENCOUNTER — Other Ambulatory Visit: Payer: Self-pay | Admitting: Neurology

## 2022-03-06 ENCOUNTER — Other Ambulatory Visit: Payer: Self-pay | Admitting: Family Medicine

## 2022-03-10 ENCOUNTER — Other Ambulatory Visit: Payer: Self-pay

## 2022-03-10 MED ORDER — ZONISAMIDE 25 MG PO CAPS
ORAL_CAPSULE | ORAL | 1 refills | Status: DC
Start: 2022-03-10 — End: 2022-05-17

## 2022-03-14 ENCOUNTER — Ambulatory Visit: Payer: BC Managed Care – PPO | Admitting: Family Medicine

## 2022-04-20 ENCOUNTER — Encounter: Payer: Self-pay | Admitting: Family Medicine

## 2022-04-20 NOTE — Telephone Encounter (Signed)
FYI

## 2022-05-12 ENCOUNTER — Other Ambulatory Visit: Payer: Self-pay | Admitting: Family Medicine

## 2022-05-16 ENCOUNTER — Encounter: Payer: Self-pay | Admitting: Neurology

## 2022-05-16 ENCOUNTER — Other Ambulatory Visit: Payer: Self-pay

## 2022-05-16 NOTE — Progress Notes (Unsigned)
Per Dr.Tat, Pt can go to zonegran 200 mg at bedtime.  Give pt enough for 90 days as requested and then Dr. Tomi Likens can address.

## 2022-05-17 ENCOUNTER — Other Ambulatory Visit (INDEPENDENT_AMBULATORY_CARE_PROVIDER_SITE_OTHER): Payer: BC Managed Care – PPO

## 2022-05-17 DIAGNOSIS — E785 Hyperlipidemia, unspecified: Secondary | ICD-10-CM | POA: Diagnosis not present

## 2022-05-17 DIAGNOSIS — E559 Vitamin D deficiency, unspecified: Secondary | ICD-10-CM

## 2022-05-17 DIAGNOSIS — Z0000 Encounter for general adult medical examination without abnormal findings: Secondary | ICD-10-CM | POA: Diagnosis not present

## 2022-05-17 DIAGNOSIS — Z125 Encounter for screening for malignant neoplasm of prostate: Secondary | ICD-10-CM | POA: Diagnosis not present

## 2022-05-17 DIAGNOSIS — I10 Essential (primary) hypertension: Secondary | ICD-10-CM | POA: Diagnosis not present

## 2022-05-17 LAB — COMPREHENSIVE METABOLIC PANEL
ALT: 8 U/L (ref 0–53)
AST: 12 U/L (ref 0–37)
Albumin: 4.8 g/dL (ref 3.5–5.2)
Alkaline Phosphatase: 32 U/L — ABNORMAL LOW (ref 39–117)
BUN: 15 mg/dL (ref 6–23)
CO2: 29 mEq/L (ref 19–32)
Calcium: 9.6 mg/dL (ref 8.4–10.5)
Chloride: 97 mEq/L (ref 96–112)
Creatinine, Ser: 1.23 mg/dL (ref 0.40–1.50)
GFR: 64.23 mL/min (ref >60.00)
Glucose, Bld: 101 mg/dL — ABNORMAL HIGH (ref 70–99)
Potassium: 3.9 mEq/L (ref 3.5–5.1)
Sodium: 141 mEq/L (ref 135–145)
Total Bilirubin: 1.7 mg/dL — ABNORMAL HIGH (ref 0.2–1.2)
Total Protein: 7.1 g/dL (ref 6.0–8.3)

## 2022-05-17 LAB — CBC WITH DIFFERENTIAL/PLATELET
Basophils Absolute: 0.1 10*3/uL (ref 0.0–0.1)
Basophils Relative: 0.9 % (ref 0.0–3.0)
Eosinophils Absolute: 0.1 10*3/uL (ref 0.0–0.7)
Eosinophils Relative: 1.5 % (ref 0.0–5.0)
HCT: 42.9 % (ref 39.0–52.0)
Hemoglobin: 14.8 g/dL (ref 13.0–17.0)
Lymphocytes Relative: 23.5 % (ref 12.0–46.0)
Lymphs Abs: 1.3 10*3/uL (ref 0.7–4.0)
MCHC: 34.5 g/dL (ref 30.0–36.0)
MCV: 86.9 fl (ref 78.0–100.0)
Monocytes Absolute: 0.6 10*3/uL (ref 0.1–1.0)
Monocytes Relative: 10.3 % (ref 3.0–12.0)
Neutro Abs: 3.6 10*3/uL (ref 1.4–7.7)
Neutrophils Relative %: 63.8 % (ref 43.0–77.0)
Platelets: 206 10*3/uL (ref 150.0–400.0)
RBC: 4.93 Mil/uL (ref 4.22–5.81)
RDW: 13.5 % (ref 11.5–15.5)
WBC: 5.6 10*3/uL (ref 4.0–10.5)

## 2022-05-17 LAB — LIPID PANEL
Cholesterol: 245 mg/dL — ABNORMAL HIGH (ref 0–200)
HDL: 60.2 mg/dL (ref >39.00)
LDL Cholesterol: 157 mg/dL — ABNORMAL HIGH (ref 0–99)
NonHDL: 184.99
Total CHOL/HDL Ratio: 4
Triglycerides: 139 mg/dL (ref 0.0–149.0)
VLDL: 27.8 mg/dL (ref 0.0–40.0)

## 2022-05-17 LAB — PSA: PSA: 0.4 ng/mL (ref 0.10–4.00)

## 2022-05-17 LAB — VITAMIN D 25 HYDROXY (VIT D DEFICIENCY, FRACTURES): VITD: 40.06 ng/mL (ref 30.00–100.00)

## 2022-05-17 MED ORDER — ZONISAMIDE 100 MG PO CAPS: ORAL_CAPSULE | ORAL | 0 refills | Status: DC

## 2022-05-24 ENCOUNTER — Encounter: Payer: Self-pay | Admitting: Family Medicine

## 2022-05-24 ENCOUNTER — Ambulatory Visit (INDEPENDENT_AMBULATORY_CARE_PROVIDER_SITE_OTHER): Payer: BC Managed Care – PPO | Admitting: Family Medicine

## 2022-05-24 VITALS — BP 110/74 | HR 81 | Temp 98.1°F | Ht 70.0 in | Wt 193.6 lb

## 2022-05-24 DIAGNOSIS — F3342 Major depressive disorder, recurrent, in full remission: Secondary | ICD-10-CM

## 2022-05-24 DIAGNOSIS — R739 Hyperglycemia, unspecified: Secondary | ICD-10-CM

## 2022-05-24 DIAGNOSIS — J329 Chronic sinusitis, unspecified: Secondary | ICD-10-CM

## 2022-05-24 DIAGNOSIS — Z23 Encounter for immunization: Secondary | ICD-10-CM | POA: Diagnosis not present

## 2022-05-24 DIAGNOSIS — J342 Deviated nasal septum: Secondary | ICD-10-CM | POA: Diagnosis not present

## 2022-05-24 DIAGNOSIS — K5150 Left sided colitis without complications: Secondary | ICD-10-CM

## 2022-05-24 DIAGNOSIS — E785 Hyperlipidemia, unspecified: Secondary | ICD-10-CM

## 2022-05-24 DIAGNOSIS — Z0000 Encounter for general adult medical examination without abnormal findings: Secondary | ICD-10-CM

## 2022-05-24 NOTE — Patient Instructions (Addendum)
Flu shot- we should have these available within a month or two but please let us know if you get at outside pharmacy  migraines improved but SE profile of increased orthostatic intolerance- since mainly using amitriptyline for sleep and sleeping ok- opted to try 12.5 mg or 10 mg (may have some old tablets at house)   Hepatitis A vaccine today- booster after having full immunization in the past  We will call you within two weeks about your referral to CT cardiac scoring with [facility] imaging. If you do not hear within 2 weeks, give [place] a call.   Recommended follow up: Return in about 6 months (around 11/24/2022) for followup or sooner if needed.Schedule b4 you leave.

## 2022-05-24 NOTE — Addendum Note (Signed)
Addended by: Clyde Lundborg A on: 05/24/2022 02:04 PM   Modules accepted: Orders

## 2022-05-24 NOTE — Progress Notes (Signed)
Phone: [phone]   Subjective:  Patient presents today for their annual physical. Chief complaint-noted.   See problem oriented charting- ROS- full  review of systems was completed and negative  except for: allergies (PND), appetite change- decreasing food intake- also thinks migraine meds affecting this, nausea at night if wakes up with headaches, back pain, muscle aches, headaches , orthostatic symptoms,   The following were reviewed and entered/updated in epic: Past Medical History:  Diagnosis Date   Allergic rhinitis    Allergy    seasonal   Arthritis    GERD (gastroesophageal reflux disease)    [name] syndrome    Heart murmur    tricuspid valve regurgitation   Hyperlipidemia    Hypertension    Internal hemorrhoids    Migraines    Palpitations    Panic attack    occasional   Syncope    on airplane in 12/2016/ due to stress   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  Follows with Dr. Fuller [facility] 05/06/2008    Priority: High   Raynaud's disease 11/15/2019    Priority: Medium    Essential hypertension 03/12/2019    Priority: Medium    Gilbert syndrome 01/26/2018    Priority: Medium    Hyperglycemia 01/26/2018    Priority: Medium    History of adenomatous polyp of colon 05/22/2017    Priority: Medium    PSORIASIS, SCALP 05/03/2010    Priority: Medium    Hyperlipidemia 05/15/2007    Priority: Medium    Migraines 05/03/2007    Priority: Medium    Vasovagal syncope 02/14/2017    Priority: Low   Hearing loss 08/04/2016    Priority: Low   Low back pain 02/09/2016    Priority: Low   DOE (dyspnea on exertion) 04/15/2014    Priority: Low   LOC 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 LEG 10/12/2010    Priority: Low   Vitamin D deficiency 10/27/2009    Priority: Low   GERD 01/31/2008    Priority: Low    INTERNAL HEMORRHOIDS 01/23/2008    Priority: Low   Allergic rhinitis 05/03/2007    Priority: Low   Family history of alpha 1 antitrypsin deficiency 06/14/2018   Past Surgical History:  Procedure Laterality Date   ADENOIDECTOMY     as a child   COLONOSCOPY  2018   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 Some memory changes. Thyroid/b12 etc workup.   Colon polyps Maternal Grandmother        [employer], Great Uncle   Crohn's disease Paternal Aunt    Crohn's disease Cousin    Colon cancer Other        Grandmother's Family (x 1 brother to grandmother and 1 sister to grandmother)   Esophageal cancer Neg Hx  Stomach cancer Neg Hx    Rectal cancer Neg Hx     Medications- reviewed and updated Current Outpatient Medications  Medication Sig Dispense Refill   ALPRAZolam (XANAX) 0.25 MG tablet Take 1 tablet (0.25 mg total) by mouth 2 (two) times daily as needed for anxiety. 20 tablet 0   amitriptyline (ELAVIL) 25 MG tablet TAKE 1 TABLET AT BEDTIME 90 tablet 3   balsalazide (COLAZAL) 750 MG capsule TAKE 3 CAPSULES 3 TIMES    DAILY 810 capsule 1   cetirizine (ZYRTEC) 10 MG tablet Take 10 mg by mouth daily.     chlorthalidone (HYGROTON) 25 MG tablet Take 1 tablet (25 mg total) by mouth daily. (Patient taking differently: Take 12.5 mg by mouth daily.) 90 tablet 3   diphenhydrAMINE (BENADRYL) 25 MG tablet Take 25 mg by mouth at bedtime as needed.     fluticasone (FLONASE) 50 MCG/ACT nasal spray USE 1 SPRAY IN EACH NOSTRILTWICE DAILY 48 g 3   Galcanezumab-gnlm (EMGALITY) 120 MG/ML SOAJ Inject 120 mg into the  skin every 30 (thirty) days. 1 mL 4   ibuprofen (ADVIL,MOTRIN) 200 MG tablet Take 200 mg by mouth as needed.       losartan (COZAAR) 50 MG tablet Take 1 tablet (50 mg total) by mouth daily. 90 tablet 3   Melatonin 5 MG CAPS Take 1 capsule by mouth at bedtime.     metoprolol succinate (TOPROL-XL) 25 MG 24 hr tablet Take 1 tablet (25 mg total) by mouth daily. 90 tablet 3   sildenafil (VIAGRA) 100 MG tablet Take 1 tablet (100 mg total) by mouth daily as needed for erectile dysfunction. 90 day supply 30 tablet 3   SUMAtriptan (IMITREX) 100 MG tablet TAKE 1 TABLET AS NEEDED FOR MIGRAINE.  MAY REPEAT AFTER 2 HOURS.  MAXIMUM 2 TABLETS IN 24 HOURS 9 tablet 5   Vitamin D, Cholecalciferol, 50 MCG (2000 UT) CAPS Take 1 capsule by mouth daily.     zonisamide (ZONEGRAN) 100 MG capsule Take 2 capules daily 180 capsule 0   famotidine (PEPCID) 20 MG tablet Take 20 mg by mouth daily as needed for heartburn or indigestion.     No current facility-administered medications for this visit.    Allergies-reviewed and updated Allergies  Allergen Reactions   Shellfish Allergy Nausea And Vomiting    scallops   Citrus     Oral pain, rash in mouth   Codeine Nausea Only   Salmon [Fish Allergy] Nausea Only and Rash    Especially smoked salmon    Social History   Social History Narrative   Lives with husband Dwaine Deter also a patient of Dr. Yong Channel). No pets or adopted children or natural children.       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       Hobbies: walking, hiking, reading, music, cooking , gardening.       Right handed.    Objective  Objective:  BP 110/74   Pulse 81   Temp 98.1 F (36.7 C)   Ht 5' 10"  (1.778 m)   Wt 193 lb 9.6 oz (87.8 kg)   SpO2 98%   BMI 27.78 kg/m  Gen: NAD, resting comfortably HEENT: Mucous membranes are moist. Oropharynx normal Neck: no thyromegaly CV: RRR no murmurs rubs or gallops Lungs: CTAB no crackles, wheeze, rhonchi Abdomen:  soft/nontender/nondistended/normal bowel sounds. No rebound or guarding.  Ext: no edema Skin: warm, dry Neuro: grossly normal, moves all extremities, PERRLA  Assessment and Plan  61 y.o. male presenting for annual physical.  Health Maintenance counseling: 1. Anticipatory guidance: Patient counseled regarding regular dental exams -q6 months, eye exams -yearly,  avoiding smoking and second hand smoke , limiting alcohol to 2 beverages per day -cutting back to one on most days- rare 2, no illicit drugs- rare marijuana .   2. Risk factor reduction:  Advised patient of need for regular exercise and diet rich and fruits and vegetables to reduce risk of heart attack and stroke.  Exercise- gym 3x a week with trainer.  Diet/weight management-Down 6 pounds from last physical- plus feels has gained muscle mass- as low as 189 on home scales- first times ince covid Wt Readings from Last 3 Encounters:  05/24/22 193 lb 9.6 oz (87.8 kg)  01/11/22 200 lb (90.7 kg)  01/10/22 200 lb (90.7 kg)  3. Immunizations/screenings/ancillary studies-around October consider flu and COVID shot as long as COVID shot is updated- likely through [facility]  -Hepatitis A vaccine today- booster after having full immunization in the past Immunization History  Administered Date(s) Administered   Influenza Whole 07/31/2009, 06/16/2010   Influenza,inj,Quad PF,6+ Mos 06/30/2015, 05/29/2019   Influenza-Unspecified 07/21/2016, 07/10/2017, 07/17/2018, 05/29/2019, 07/15/2020, 07/29/2021   Moderna Sars-Covid-2 Vaccination 01/13/2021   PFIZER(Purple Top)SARS-COV-2 Vaccination 12/14/2019, 01/04/2020, 07/15/2020   Pfizer Covid-19 Vaccine Bivalent Booster 74yr & up 07/29/2021   Td 05/09/2006   Tdap 11/01/2013   Zoster Recombinat (Shingrix) 10/15/2018, 03/12/2019   4. Prostate cancer screening- low risk psa has a trend Lab Results  Component Value Date   PSA 0.40 05/17/2022   PSA 0.49 03/29/2021   PSA 0.30 02/12/2020   5. Colon cancer  screening - history of adenomatous polyps of colon 05/06/2020 + has ulcerative colitis-3-year repeat planned 6. Skin cancer screening-sees dermatology yearly. advised regular sunscreen use. Denies worrisome, changing, or new skin lesions.  7. Smoking associated screening (lung cancer screening, AAA screen 65-75, UA)-never smoker  8. STD screening - monogamous so declines  Status of chronic or acute concerns   #social update- signed up for phased retirement starting in august- 3 years of part time then will be done. Has been enjoying writing - last class will be before [id number] #travel questions- [place]- may need Typhoid- will reach out after he researches  #small vessel ischemic disease in brother- no evidence of stroke. Some memory changes. Thyroid/b12 etc workup. Brother is concerned for alzehimers  #hyperlipidemia S: Medication:none  The 10-year ASCVD risk score (Arnett DK, et al., 2019) is: 7.5%  Lab Results  Component Value Date   CHOL 245 (H) 05/17/2022   HDL 60.20 05/17/2022   LDLCALC 157 (H) 05/17/2022   LDLDIRECT 148.0 10/15/2018   TRIG 139.0 05/17/2022   CHOLHDL 4 05/17/2022   A/P: mild elevations but will get CT cardiac scoring for more info with risk now at 7.5%  #hypertension S: medication: Chlorthalidone 12.5 mg, losartan 50 mg, metoprolol 50 mg extended release Home readings #s: diastolic 878-29at home BP Readings from Last 3 Encounters:  05/24/22 110/74  08/02/21 122/88  04/01/21 110/82  A/P: Controlled. Continue current medications.    #Vitamin D deficiency S: Medication: 1000 or 2000 units OTC Last vitamin D Lab Results  Component Value Date   VD25OH 40.06 05/17/2022  A/P: Controlled. Continue current medications.      # Hyperglycemia/insulin resistance/prediabetes- cbg slightly high 101- can check a1c next labs  #Ulcerative colitis-[facility].  Remains on balsalazide 3 capsules 3 times a  day of 750 mg- 2 capsules twic ea day seems to be worsening  # Depression S: Medication:Amitriptyline 25 mg primarily for sleep (and dropping) -off escitalopram 5 mg-unfortunately with sexual side effects of anorgasmia so we opted to trial off, alprazolam as needed for anxiety-very sparing    05/24/2022   12:49 PM 01/11/2022    9:48 AM 08/02/2021    3:51 PM  Depression screen PHQ 2/9  Decreased Interest 0 0 0  Down, Depressed, Hopeless 0 0 1  PHQ - 2 Score 0 0 1  Altered sleeping 2 0 0  Tired, decreased energy 0 0 0  Change in appetite 0 0 0  Feeling bad or failure about yourself  0 0 0  Trouble concentrating 0 0 0  Moving slowly or fidgety/restless 0 0 0  Suicidal thoughts 0 0 0  PHQ-9 Score 2 0 1  Difficult doing work/chores Not difficult at all Not difficult at all Not difficult at all   A/P: full remission without lexapro (amitriptyline could have mild benefit)- continue to monitor- he is very self aware and will let us know if any worsening symptoms   #GERD-reasonable control with Pepcid sparingly with advil- usually doesn't need  #Migraine S: Medication: Emgality through neurology as well as zonisamide 100 mg daily (worries about memory on higher dose- orthostatic intolerance several times a day), also has sumatriptan available (usually 4-5 a month- often at night with nausea- sometimes after too much wine- trying to cut down and improved diet)  A/P: migraines improved but SE profile of increased orthostatic intolerance- since mainly using amitriptyline for sleep and sleeping ok- opted to try 12.5 mg or 10 mg (may have some old tablets at house)   #ED- viagra can help but can cause headaches and orthostatic symptoms  #Sinus issues- deviated septum and possible mucoceles- plus has had recurrent sinus infections over the years- will refer to ENT for their opinion   Recommended follow up: Return in about 6 months (around 11/24/2022) for followup or sooner if needed.Schedule b4 you  leave. Future Appointments  Date Time Provider [place]  01/11/2023  8:50 AM Metta Clines R, DO [facility] None   Lab/Order associations: already had fasting labs   ICD-10-CM   1. Preventative health care  Z00.00     2. Deviated septum  J34.2 Ambulatory referral to ENT    3. Recurrent sinus infections  J32.9 Ambulatory referral to ENT    4. Hyperlipidemia, unspecified hyperlipidemia type  E78.5 CT CARDIAC SCORING (SELF PAY ONLY)    5. Hyperglycemia  R73.9      Return precautions advised.  [name], MD

## 2022-06-02 ENCOUNTER — Other Ambulatory Visit: Payer: Self-pay

## 2022-06-02 ENCOUNTER — Encounter: Payer: Self-pay | Admitting: Family Medicine

## 2022-06-02 MED ORDER — AMITRIPTYLINE HCL 10 MG PO TABS: 10.0000 mg | ORAL_TABLET | Freq: Every day | ORAL | 3 refills | Status: DC

## 2022-06-16 NOTE — Telephone Encounter (Signed)
Left patient vm to call back to schedule appointment to follow up with Dr. Yong Channel.

## 2022-06-17 ENCOUNTER — Other Ambulatory Visit: Payer: Self-pay | Admitting: Family Medicine

## 2022-06-23 ENCOUNTER — Ambulatory Visit: Payer: BC Managed Care – PPO | Admitting: Family Medicine

## 2022-06-23 ENCOUNTER — Encounter: Payer: Self-pay | Admitting: Family Medicine

## 2022-06-23 VITALS — BP 106/80 | HR 74 | Temp 97.5°F | Ht 70.0 in | Wt 188.4 lb

## 2022-06-23 DIAGNOSIS — G43119 Migraine with aura, intractable, without status migrainosus: Secondary | ICD-10-CM | POA: Diagnosis not present

## 2022-06-23 DIAGNOSIS — Z23 Encounter for immunization: Secondary | ICD-10-CM

## 2022-06-23 DIAGNOSIS — F3342 Major depressive disorder, recurrent, in full remission: Secondary | ICD-10-CM

## 2022-06-23 DIAGNOSIS — G4700 Insomnia, unspecified: Secondary | ICD-10-CM

## 2022-06-23 DIAGNOSIS — I10 Essential (primary) hypertension: Secondary | ICD-10-CM | POA: Diagnosis not present

## 2022-06-23 MED ORDER — TYPHOID VACCINE PO CPDR: 1.0000 | DELAYED_RELEASE_CAPSULE | ORAL | 0 refills | Status: DC

## 2022-06-23 MED ORDER — NORTRIPTYLINE HCL 25 MG PO CAPS: 25.0000 mg | ORAL_CAPSULE | Freq: Every day | ORAL | 5 refills | Status: DC

## 2022-06-23 NOTE — Progress Notes (Signed)
Phone [phone] In person visit   Subjective:   [name] is a 61 y.o. year old very pleasant male patient who presents for/with See problem oriented charting Chief Complaint  Patient presents with   Follow-up    Pt is wanting to f/u on Amitriptyline and Zonisamide and would also like the oral typhoid since he is going out of the country.   Past Medical History-  Patient Active Problem List   Diagnosis Date Noted   Major depression, recurrent, full remission ([place]) 08/16/2018    Priority: High   ULCERATIVE COLITIS-LEFT SIDE.  Follows with Dr. Fuller [facility] 05/06/2008    Priority: High   Raynaud's disease 11/15/2019    Priority: Medium    Essential hypertension 03/12/2019    Priority: Medium    Gilbert syndrome 01/26/2018    Priority: Medium    Hyperglycemia 01/26/2018    Priority: Medium    History of adenomatous polyp of colon 05/22/2017    Priority: Medium    PSORIASIS, SCALP 05/03/2010    Priority: Medium    Hyperlipidemia 05/15/2007    Priority: Medium    Migraines 05/03/2007    Priority: Medium    Vasovagal syncope 02/14/2017    Priority: Low   Hearing loss 08/04/2016    Priority: Low   Low back pain 02/09/2016    Priority: Low   DOE (dyspnea on exertion) 04/15/2014    Priority: Low   LOC 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 LEG 10/12/2010    Priority: Low   Vitamin D deficiency 10/27/2009    Priority: Low   GERD 01/31/2008    Priority: Low   INTERNAL HEMORRHOIDS 01/23/2008    Priority: Low   Allergic rhinitis 05/03/2007    Priority: Low   Insomnia 06/23/2022   Family history of alpha 1 antitrypsin deficiency 06/14/2018    Medications- reviewed and updated Current Outpatient Medications  Medication Sig Dispense Refill   ALPRAZolam (XANAX) 0.25 MG tablet Take 1 tablet (0.25 mg total) by mouth 2 (two) times daily as needed for anxiety. 20 tablet 0   balsalazide (COLAZAL) 750 MG capsule TAKE 3 CAPSULES 3 TIMES    DAILY 810 capsule 1    cetirizine (ZYRTEC) 10 MG tablet Take 10 mg by mouth daily.     chlorthalidone (HYGROTON) 25 MG tablet Take 1 tablet (25 mg total) by mouth daily. (Patient taking differently: Take 12.5 mg by mouth daily.) 90 tablet 3   diphenhydrAMINE (BENADRYL) 25 MG tablet Take 25 mg by mouth at bedtime as needed.     fluticasone (FLONASE) 50 MCG/ACT nasal spray USE 1 SPRAY IN EACH NOSTRILTWICE DAILY 48 g 3   Galcanezumab-gnlm (EMGALITY) 120 MG/ML SOAJ Inject 120 mg into the skin every 30 (thirty) days. 1 mL 4   ibuprofen (ADVIL,MOTRIN) 200 MG tablet Take 200 mg by mouth as needed.       losartan (COZAAR) 50 MG tablet Take 1 tablet (50 mg total) by mouth daily. 90 tablet 3   Melatonin 5 MG CAPS Take 1 capsule by mouth at bedtime.     metoprolol succinate (TOPROL-XL) 25 MG 24 hr tablet TAKE 1 TABLET DAILY 90 tablet 3   nortriptyline (PAMELOR) 25 MG capsule Take 1 capsule (25 mg total) by mouth at bedtime. 30 capsule 5   sildenafil (VIAGRA) 100 MG tablet Take 1 tablet (100 mg total) by mouth daily as needed for erectile dysfunction. 90 day supply 30 tablet 3   SUMAtriptan (IMITREX) 100 MG tablet TAKE 1 TABLET  AS NEEDED FOR MIGRAINE.  MAY REPEAT AFTER 2 HOURS.  MAXIMUM 2 TABLETS IN 24 HOURS 9 tablet 5   typhoid (VIVOTIF) DR capsule Take 1 capsule by mouth every other day. One capsule on alternate days (day 1, 3, 5, and 7) for a total of 4 doses; all doses should be complete at least 1 week prior to potential exposure 4 capsule 0   Vitamin D, Cholecalciferol, 50 MCG (2000 UT) CAPS Take 1 capsule by mouth daily.     zonisamide (ZONEGRAN) 100 MG capsule Take 2 capules daily 180 capsule 0   No current facility-administered medications for this visit.     Objective:  BP 106/80   Pulse 74   Temp (!) 97.5 F (36.4 C)   Ht 5' 10"  (1.778 m)   Wt 188 lb 6.4 oz (85.5 kg)   SpO2 97%   BMI 27.03 kg/m  Gen: NAD, resting comfortably     Assessment and Plan   # Migraines #Insomnia/anxiety S: Medication: Emgality  through neurology as well as zonisamide 100 mg --> 211m (frequency perhaps slightly better but severity definitely better- advil works better than sumatriptan if wakes up into the migraine) - Sumatriptan available for abortive therapy -On amitriptyline primarily for sleep so we opted to try 12.5 mg instead of 25 mg to see if this would help with orthostatic intolerance and allow more space for zonisamide to increase.  Unfortunately when we went down to 10 mg sleep patterns worsened as did anxiety-and decided to go back up to 25 mg dose-at which point orthostatic symptoms worsened- rather significant issues with that- has gotten more used to it over the last week or so though A/P: Migraines seem to be improving on zonisamide at higher dose but with more orthostatic issues.  Attempted reduction in amitriptyline to 10 mg but with worsening sleep and anxiety but when he went back to 25 mg caused significant orthostatic issues-as result we opted to try nortriptyline 25 mg which has a better orthostatic profile and less anticholinergic effects (has some dry mouth) -We considered trying trazodone for sleep instead but do not think it would help as much with anxiety-still a potential option  #Upcoming travel- needs typhoid vaccination.  Last treatment 2018  # Depression S: Medication: Essentially only amitriptyline 25 mg though not primarily for depression -therapist mentioned trying trazodone for sleep -therapist weekly  -Lexapro helped in the past but intolerable side effect profile.  Pristiq was not beneficial even with addition of Wellbutrin A/P: Patient wants to be proactive about potential solutions though [facility]-states therapist wrote me a letter about potential solutions-I will be on the look out for this-for now may transition to nortriptyline 25 mg from amitriptyline 25 mg  #hypertension S: medication: Chlorthalidone 12.5 mg,  losartan 50 mg, metoprolol 25 mg extended release Home readings #s: 110/80 BP Readings from Last 3 Encounters:  06/23/22 106/80  05/24/22 110/74  08/02/21 122/88  A/P: Patient with significant weight loss seemingly from zonisamide-Home blood pressures running excellent as well-discussed potentially reducing dose-likely start with stopping chlorthalidone-if blood pressure lowers any further-though he does get some benefit swelling wise from this medication -For now continue current medication  Recommended follow up: Return for next already scheduled visit or sooner if needed. Future Appointments  Date Time Provider [place] 07/01/2022  8:00 AM [facility] [place] [facility] 1 [facility]  11/24/2022 10:20 AM HYong Channel SBrayton Mars MD  [facility]  01/11/2023  8:50 AM Jaffe, Adam R, DO [facility] None    Lab/Order associations:   ICD-10-CM   1. Major depression, recurrent, full remission ([place])  F33.42     2. Essential hypertension  I10     3. Intractable migraine with aura without status migrainosus  G43.119     4. Insomnia, unspecified type  G47.00       Meds ordered this encounter  Medications   typhoid (VIVOTIF) DR capsule    Sig: Take 1 capsule by mouth every other day. One capsule on alternate days (day 1, 3, 5, and 7) for a total of 4 doses; all doses should be complete at least 1 week prior to potential exposure    Dispense:  4 capsule    Refill:  0   nortriptyline (PAMELOR) 25 MG capsule    Sig: Take 1 capsule (25 mg total) by mouth at bedtime.    Dispense:  30 capsule    Refill:  5    Return precautions advised.  [name], MD

## 2022-06-23 NOTE — Patient Instructions (Addendum)
Trial nortriptyline 25 mg in place of amitriptyline 25 mg   Recommended follow up: Return for next already scheduled visit or sooner if needed.

## 2022-06-24 DIAGNOSIS — Z23 Encounter for immunization: Secondary | ICD-10-CM

## 2022-07-01 ENCOUNTER — Ambulatory Visit
Admission: RE | Admit: 2022-07-01 | Discharge: 2022-07-01 | Disposition: A | Discharge status: Home / Self Care | Payer: No Typology Code available for payment source | Source: Ambulatory Visit | Attending: Family Medicine | Admitting: Family Medicine

## 2022-07-01 DIAGNOSIS — E785 Hyperlipidemia, unspecified: Secondary | ICD-10-CM

## 2022-07-02 ENCOUNTER — Encounter: Payer: Self-pay | Admitting: Family Medicine

## 2022-07-06 ENCOUNTER — Encounter: Payer: Self-pay | Admitting: Family Medicine

## 2022-07-06 NOTE — Telephone Encounter (Signed)
See below

## 2022-07-10 ENCOUNTER — Encounter: Payer: Self-pay | Admitting: Family Medicine

## 2022-07-17 ENCOUNTER — Other Ambulatory Visit: Payer: Self-pay | Admitting: Family Medicine

## 2022-07-28 ENCOUNTER — Other Ambulatory Visit: Payer: Self-pay | Admitting: Neurology

## 2022-08-10 ENCOUNTER — Other Ambulatory Visit: Payer: BC Managed Care – PPO

## 2022-09-01 ENCOUNTER — Other Ambulatory Visit: Payer: Self-pay | Admitting: Neurology

## 2022-09-09 ENCOUNTER — Other Ambulatory Visit: Payer: BC Managed Care – PPO

## 2022-09-19 ENCOUNTER — Encounter: Payer: Self-pay | Admitting: Family Medicine

## 2022-10-16 ENCOUNTER — Encounter: Payer: Self-pay | Admitting: Neurology

## 2022-10-26 ENCOUNTER — Other Ambulatory Visit: Payer: Self-pay | Admitting: Neurology

## 2022-11-09 ENCOUNTER — Ambulatory Visit: Payer: BC Managed Care – PPO | Admitting: Neurology

## 2022-11-14 NOTE — Progress Notes (Unsigned)
NEUROLOGY FOLLOW UP OFFICE NOTE  [name] [id number]  Assessment/Plan:   Migraine without aura, without status migrainosus, not intractable   1.  Migraine prevention:  Emgality.  Also on amitriptyline (for sleep).  Due to slight increased migraine frequency, suggested adding zonisamide.  He plans to taper off of Lexapro.  Once he is off of Lexapro and if migraines do not improve, he will reach out to me about adding zonidamide. 2.  Migraine rescue:  Sumatriptan '100mg'$  3.  Limit use of pain relievers to no more than 2 days out of week to prevent risk of rebound or medication-overuse headache. 4.  Keep headache diary 5.  Follow up one year  Subjective:  [name] is a 62 year old Caucasian man with depression, anxiety and Gilbert syndrome who follows up for migraines and possible left-sided trigeminal neuralgia.   UPDATE: No trigeminal neuralgia since last visit.   Tapered off escitalopram and we started zonisamide.  Since [place] has been traveling out of the country, he wasn't able to continue on Emgality.  He hasn't taken it since November.  Intensity:  Moderate (no severe) Duration:  1 hour.   Frequency:  6-7 a month (4-5 last visit)   Current NSAIDS:  none Current analgesics:  Sumatriptan '100mg'$  Current triptans:  none Current ergotamine:  none Current anti-emetic: Promethazine 12.5 mg Current muscle relaxants: none Current anti-anxiolytic:  none Current sleep aide: melatonin  Current Antihypertensive medications: metoprolol succinate '25mg'$  QHS; losartan Current Antidepressant medications: none Current Anticonvulsant medications:  zonisamide '200mg'$  daily Current anti-CGRP:  Emgality (not taken since November) Current Vitamins/Herbal/Supplements: Vitamin D, melatonin Current Antihistamines/Decongestants: Benadryl, Zyrtec Other therapy:  none Other medications:  none   Caffeine:  1 mug of coffee daily  Diet:  Drinks water Exercise:  yes Depression:  yes; Anxiety:  yes - started Pristiq in February and now improved. Other pain:  Muscle aches Sleep hygiene:  Well   HISTORY:  Onset:  Childhood and then since late 20s Location:  Unilateral (either side)  left behind eye radiating to back of head Quality:  pressure Initial Intensity:  7/10.  He denies new headache, thunderclap headache.  Sometimes wakes up with them. Aura:  On rare occasions had jagged lines in vision Premonitory Phase:  Mild numb sensation, flushing of ear on side of headache Postdrome:  A day he has a fatigue sensation Associated symptoms:  Photophobia, phonophobia, osmophobia, nausea.  He denies associated unilateral numbness or weakness. Initial Duration:  5-6 hours (if takes sumatriptan immediately, then 1 hour) Initial Frequency:  10 to 15 days Initial Frequency of abortive medication: at least 10 days a month Triggers:  Some strong smells,chocolate or red wine, often after a stressful event, exposed to direct sunlight Relieving factors:  Sumatriptan Activity:  aggravates   In March 2020, he had a new migraine associated with aura.  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  He has had one subsequent event.     In October-November 2020, He had an episode of possible left-sided trigeminal neuralgia.  He described a spasm on side of his left eye radiating to the cheek.  It was an electric shock.  It lasted a second and occurred 3 times.  Eye exam was fine.    Past NSAIDS:  Ibuprofen, naproxen Past analgesics:  Tylenol Past abortive triptans: rizatriptan, eletriptan? Past abortive ergotamine:  none Past muscle relaxants:  none Past anti-emetic:  none Past antihypertensive medications:  propranolol (caused fatigue and  bradycardia) Past antidepressant medications:  amitriptyline, doxepin Past anticonvulsant medications:  none Past anti-CGRP:  Nurtec (effective but comparable to sumatriptan in efficacy)  Past  vitamins/Herbal/Supplements:  none Past antihistamines/decongestants:  none Other past therapies:  none   Family history of headache:  Father, niece   PAST MEDICAL HISTORY: Past Medical History:  Diagnosis Date   Allergic rhinitis    Allergy    seasonal   Arthritis    GERD (gastroesophageal reflux disease)    Gilbert syndrome    Heart murmur    tricuspid valve regurgitation   Hyperlipidemia    Hypertension    Internal hemorrhoids    Migraines    Palpitations    Panic attack    occasional   Syncope    on airplane in 12/2016/ due to stress   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 20 tablet 0   balsalazide (COLAZAL) 750 MG capsule TAKE 3 CAPSULES 3 TIMES    DAILY 810 capsule 1   cetirizine (ZYRTEC) 10 MG tablet Take 10 mg by mouth daily.     chlorthalidone (HYGROTON) 25 MG tablet Take 1 tablet (25 mg total) by mouth daily. (Patient taking differently: Take 12.5 mg by mouth daily.) 90 tablet 3   diphenhydrAMINE (BENADRYL) 25 MG tablet Take 25 mg by mouth at bedtime as needed.     fluticasone (FLONASE) 50 MCG/ACT nasal spray USE 1 SPRAY IN EACH NOSTRILTWICE DAILY 48 g 3   Galcanezumab-gnlm (EMGALITY) 120 MG/ML SOAJ Inject 120 mg into the skin every 30 (thirty) days. 1 mL 4   ibuprofen (ADVIL,MOTRIN) 200 MG tablet Take 200 mg by mouth as needed.       losartan (COZAAR) 50 MG tablet Take 1 tablet (50 mg total) by mouth daily. 90 tablet 3   Melatonin 5 MG CAPS Take 1 capsule by mouth at bedtime.     metoprolol succinate (TOPROL-XL) 25 MG 24 hr tablet TAKE 1 TABLET DAILY 90 tablet 3   nortriptyline (PAMELOR) 25 MG capsule TAKE 1 CAPSULE BY MOUTH AT BEDTIME. 90 capsule 2   sildenafil (VIAGRA) 100 MG tablet Take 1 tablet (100 mg total) by mouth daily as needed for  erectile dysfunction. 90 day supply 30 tablet 3   SUMAtriptan (IMITREX) 100 MG tablet TAKE 1 TABLET AS NEEDED FORMIGRAINE. MAY REPEAT AFTER 2 HOURS. MAXIMUM 2 TABLETS IN 24 HOURS 9 tablet 5   typhoid (VIVOTIF) DR capsule Take 1 capsule by mouth every other day. One capsule on alternate days (day 1, 3, 5, and 7) for a total of 4 doses; all doses should be complete at least 1 week prior to potential exposure 4 capsule 0   Vitamin D, Cholecalciferol, 50 MCG (2000 UT) CAPS Take 1 capsule by mouth daily.     zonisamide (ZONEGRAN) 100 MG capsule TAKE 2 CAPULES BY MOUTH ONCE DAILY 180 capsule 0   [DISCONTINUED] glycopyrrolate (ROBINUL) 2 MG tablet Take 2 mg by mouth 2 (two) times daily.       No current facility-administered medications on file prior to visit.    ALLERGIES: Allergies  Allergen Reactions   Shellfish Allergy Nausea And Vomiting    scallops   Citrus     Oral pain, rash in mouth   Codeine Nausea Only   Hyman Hopes  Allergy] Nausea Only and Rash    Especially smoked salmon    FAMILY HISTORY: Family History  Problem Relation Age of Onset   Arthritis Mother    Thyroid disease Father    Heart disease Father        a fib, expanded aorta   Heart disease Brother        marfanoid related- connective tissue related   Colon polyps Brother    Other Brother        #small vessel ischemic disease in brother- no evidence of stroke. Some memory changes. Thyroid/b12 etc workup.   Colon polyps Maternal Grandmother        [employer], Great Uncle   Crohn's disease Paternal Aunt    Crohn's disease Cousin    Colon cancer Other        Grandmother's Family (x 1 brother to grandmother and 1 sister to grandmother)   Esophageal cancer Neg Hx    Stomach cancer Neg Hx    Rectal cancer Neg Hx       Objective:  *** General: No acute distress.  Patient appears ***-groomed.   Head:  Normocephalic/atraumatic Eyes:  Fundi examined but not visualized Neck: supple, no paraspinal tenderness, full  range of motion Heart:  Regular rate and rhythm Lungs:  Clear to auscultation bilaterally Back: No paraspinal tenderness Neurological Exam: alert and oriented to person, place, and time.  Speech fluent and not dysarthric, language intact.  CN II-XII intact. Bulk and tone normal, muscle strength 5/5 throughout.  Sensation to light touch intact.  Deep tendon reflexes 2+ throughout, toes downgoing.  Finger to nose testing intact.  Gait normal, Romberg negative.   Metta Clines, DO  CC: ***

## 2022-11-16 ENCOUNTER — Ambulatory Visit: Payer: BC Managed Care – PPO | Admitting: Neurology

## 2022-11-16 ENCOUNTER — Encounter: Payer: Self-pay | Admitting: Neurology

## 2022-11-16 ENCOUNTER — Other Ambulatory Visit: Payer: Self-pay | Admitting: Neurology

## 2022-11-16 VITALS — BP 121/81 | HR 91 | Resp 18 | Ht 70.0 in | Wt 185.0 lb

## 2022-11-16 DIAGNOSIS — G43009 Migraine without aura, not intractable, without status migrainosus: Secondary | ICD-10-CM | POA: Diagnosis not present

## 2022-11-16 MED ORDER — SUMATRIPTAN SUCCINATE 100 MG PO TABS
ORAL_TABLET | ORAL | 5 refills | Status: DC
  Filled 2023-01-02: qty 9, 30d supply, fill #0
  Filled 2023-02-02: qty 9, 30d supply, fill #1
  Filled 2023-03-06: qty 9, 30d supply, fill #2
  Filled 2023-04-04: qty 9, 30d supply, fill #3
  Filled 2023-04-30: qty 9, 30d supply, fill #4
  Filled 2023-05-31: qty 9, 30d supply, fill #5

## 2022-11-16 MED ORDER — SUMATRIPTAN SUCCINATE 6 MG/0.5ML ~~LOC~~ SOAJ: 6.0000 mg | SUBCUTANEOUS | 5 refills | Status: DC | PRN

## 2022-11-16 MED ORDER — AJOVY 225 MG/1.5ML ~~LOC~~ SOAJ
225.0000 mg | SUBCUTANEOUS | 5 refills | Status: DC
  Filled 2023-01-02 – 2023-01-03 (×2): qty 1.5, 28d supply, fill #0
  Filled 2023-02-02: qty 1.5, 28d supply, fill #1
  Filled 2023-03-06: qty 1.5, 28d supply, fill #2
  Filled 2023-04-04: qty 1.5, 28d supply, fill #3

## 2022-11-16 MED ORDER — ZONISAMIDE 100 MG PO CAPS
200.0000 mg | ORAL_CAPSULE | Freq: Every day | ORAL | 1 refills | Status: DC
  Filled 2023-02-06: qty 180, 90d supply, fill #0

## 2022-11-16 NOTE — Patient Instructions (Signed)
Start Ajovy every 28 days Continue zonisamide '200mg'$  daily Continue use of sumatriptan tablet for regular migraines If you wake up with a migraine, use sumatriptan injection as directed.  Cannot take within 24 hours of sumatriptan tablet Follow up 5 months.

## 2022-11-20 ENCOUNTER — Encounter: Payer: Self-pay | Admitting: Neurology

## 2022-11-21 ENCOUNTER — Telehealth: Payer: Self-pay

## 2022-11-21 NOTE — Telephone Encounter (Signed)
Per Patient PA needed for Sumatriptan injection.   Pa Team Please start a PA for Sumatriptan Injection 6 mg.

## 2022-11-24 ENCOUNTER — Encounter: Payer: Self-pay | Admitting: Family Medicine

## 2022-11-24 ENCOUNTER — Ambulatory Visit: Payer: BC Managed Care – PPO | Admitting: Family Medicine

## 2022-11-24 VITALS — BP 106/78 | HR 78 | Temp 97.0°F | Ht 70.0 in | Wt 187.2 lb

## 2022-11-24 DIAGNOSIS — G4700 Insomnia, unspecified: Secondary | ICD-10-CM

## 2022-11-24 DIAGNOSIS — G43119 Migraine with aura, intractable, without status migrainosus: Secondary | ICD-10-CM

## 2022-11-24 DIAGNOSIS — F3342 Major depressive disorder, recurrent, in full remission: Secondary | ICD-10-CM | POA: Diagnosis not present

## 2022-11-24 DIAGNOSIS — K5150 Left sided colitis without complications: Secondary | ICD-10-CM

## 2022-11-24 DIAGNOSIS — I10 Essential (primary) hypertension: Secondary | ICD-10-CM

## 2022-11-24 DIAGNOSIS — E785 Hyperlipidemia, unspecified: Secondary | ICD-10-CM

## 2022-11-24 MED ORDER — AMITRIPTYLINE HCL 10 MG PO TABS: 10.0000 mg | ORAL_TABLET | Freq: Every day | ORAL | 3 refills | Status: DC

## 2022-11-24 NOTE — Progress Notes (Signed)
Phone [phone] In person visit   Subjective:   [name] is a 62 y.o. year old very pleasant male patient who presents for/with See problem oriented charting Chief Complaint  Patient presents with   Follow-up    (NO MASK)   Hypertension   Depression   infected cyst    Pt has infected cyst on back that he had drained in [place].   Tinnitus    Right ear ringing and loudness.   RSV    Would like to discuss RSV vaccine.    Past Medical History-  Patient Active Problem List   Diagnosis Date Noted   Major depression, recurrent, full remission ([place]) 08/16/2018    Priority: High   ULCERATIVE COLITIS-LEFT SIDE.  Follows with Dr. Fuller [facility] 05/06/2008    Priority: High   Insomnia 06/23/2022    Priority: Medium    Raynaud's disease 11/15/2019    Priority: Medium    Essential hypertension 03/12/2019    Priority: Medium    Gilbert syndrome 01/26/2018    Priority: Medium    Hyperglycemia 01/26/2018    Priority: Medium    History of adenomatous polyp of colon 05/22/2017    Priority: Medium    PSORIASIS, SCALP 05/03/2010    Priority: Medium    Hyperlipidemia 05/15/2007    Priority: Medium    Migraines 05/03/2007    Priority: Medium    Vasovagal syncope 02/14/2017    Priority: Low   Hearing loss 08/04/2016    Priority: Low   Low back pain 02/09/2016    Priority: Low   DOE (dyspnea on exertion) 04/15/2014    Priority: Low   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 90 tablet 3   balsalazide  (COLAZAL) 750 MG capsule TAKE 3 CAPSULES 3 TIMES    DAILY 810 capsule 1   cetirizine (ZYRTEC) 10 MG tablet Take 10 mg by mouth daily.     chlorthalidone (HYGROTON) 25 MG tablet Take 1 tablet (25 mg total) by mouth daily. (Patient taking differently: Take 12.5 mg by mouth daily.) 90 tablet 3   diphenhydrAMINE (BENADRYL) 25 MG tablet Take 25 mg by mouth at bedtime as needed.     fluticasone (FLONASE) 50 MCG/ACT nasal spray USE 1 SPRAY IN EACH NOSTRILTWICE DAILY 48 g 3   Fremanezumab-vfrm (AJOVY) 225 MG/1.5ML SOAJ Inject 225 mg into the skin every 28 (twenty-eight) days. 1.68 mL 5   ibuprofen (ADVIL,MOTRIN) 200 MG tablet Take 200 mg by mouth as needed.       losartan (COZAAR) 50 MG tablet Take 1 tablet (50 mg total) by mouth daily. 90 tablet 3   Melatonin 5 MG CAPS Take 1 capsule by mouth at bedtime.     metoprolol succinate (TOPROL-XL) 25 MG 24 hr tablet TAKE 1 TABLET DAILY 90 tablet 3   sildenafil (VIAGRA) 100 MG tablet Take 1 tablet (100 mg total) by mouth daily as needed for erectile dysfunction. 90 day supply 30 tablet 3   SUMAtriptan (  IMITREX) 100 MG tablet TAKE 1 TABLET AS NEEDED FORMIGRAINE. MAY REPEAT AFTER 2 HOURS. MAXIMUM 2 TABLETS IN 24 HOURS 9 tablet 5   SUMAtriptan 6 MG/0.5ML SOAJ Inject 6 mg into the skin as needed. May repeat after 1 hour.  Maximum 2 doses in 24 hours. 3 mL 5   Vitamin D, Cholecalciferol, 50 MCG (2000 UT) CAPS Take 1 capsule by mouth daily.     zonisamide (ZONEGRAN) 100 MG capsule TAKE 2 CAPULES BY MOUTH ONCE DAILY 180 capsule 1   No current facility-administered medications for this visit.     Objective:  BP 106/78   Pulse 78   Temp (!) 97 F (36.1 C)   Ht 5' 10"$  (1.778 m)   Wt 187 lb 3.2 oz (84.9 kg)   SpO2 99%   BMI 26.86 kg/m  Gen: NAD, resting comfortably Bilateral TM normal Skin: warm, dry, well healed prior incision on left upper back- some 1 x 1     Assessment and Plan   #social update- retired since last visit!   # Deviated  septum/mucoceles-saw ENT and they did not recommend sinus surgery unless have increased frequency of sinus infections   # Migraines-zonisamide 200 mg with recent Ajovy addition- hoping dosing works better with his travel.  Sumatriptan still available for abortive therapy including IM version.  Amitriptyline helps with sleep and could help migraines but has some orthostatic issues-no improvement with nortriptyline so we switched back to amitriptyline 25 mg- see below going to try 10 mg.  Metoprolol 25 mg extended release may also help  #Covid - had in November- in bed for 3 days and had resurgence about a week later  # Ulcerative colitis-follows with Dr. Fuller Plan on balsalazide- slight increase in discharge- wonders if beginning of flare but will monitor- No abdominal cramping or blood in stool.   #hypertension S: medication: Chlorthalidone 12.5 mg, losartan 50 mg, metoprolol 12.5 mg extended release BP Readings from Last 3 Encounters:  11/24/22 106/78  11/16/22 121/81  06/23/22 106/80  A/P: stable- continue current medicines   #hyperlipidemia- Ct calcium scoring of 0 on 07/02/22 S: Medication:none   A/P: we opted to continue off meds with reassuring score and can recheck in 5 years perhaps.   # Depression/insomnia S: Medication:Essentially only amitriptyline at 25 mg -has some sadness but not pervasive like it was before -anorgasmia could be caused by amitriptyline    11/24/2022   10:29 AM 05/24/2022   12:49 PM 01/11/2022    9:48 AM  Depression screen PHQ 2/9  Decreased Interest 0 0 0  Down, Depressed, Hopeless 0 0 0  PHQ - 2 Score 0 0 0  Altered sleeping 1 2 0  Tired, decreased energy 0 0 0  Change in appetite 0 0 0  Feeling bad or failure about yourself  0 0 0  Trouble concentrating 0 0 0  Moving slowly or fidgety/restless 0 0 0  Suicidal thoughts 0 0 0  PHQ-9 Score 1 2 0  Difficult doing work/chores Not difficult at all Not difficult at all Not difficult at all  A/P: depression  well controlled without meds- amitriptyline has been used long term mainly for sleep-and with mild potential migraine benefit- we are going to try to reduce to 10 mg and see if this helps anorgasmia issues.  -ED meds cause migraines   # Infected cyst on his back-he had this drained in [place]- has healed but has some tissue bulge to right of scar- ? Scar tissue vs  ongoing cyst- he may see dermatology- [place] [facility] to discuss removal   # Tinnitus-right ear ringing and loudness  -temple in [place] priest hit gong very hard close to him- notes metallic chirp when opens jaw. Some dizziness issues since getting home- no issues with travel- may be on back and getting up more quickly at home.  -can go see ENT if needed but exam is normal today  # RSV vaccination- wants to hold off for now   #Right hand grip- has noted dropped a few things and he is right handed. Has tried to do some gym exercise to help and could be fatigued when has the drops he mentions. Grip strength good on exam- will note.   Recommended follow up: Return in about 6 months (around 05/25/2023) for physical or sooner if needed.Schedule b4 you leave. Future Appointments  Date Time Provider [place]  01/11/2023  8:50 AM Pieter Partridge, DO [facility] None  04/18/2023  9:10 AM Tomi Likens, Adam R, DO [facility] None    Lab/Order associations:   ICD-10-CM   1. Essential hypertension  I10     2. Major depression, recurrent, full remission ([place])  F33.42     3. Hyperlipidemia, unspecified hyperlipidemia type  E78.5     4. Intractable migraine with aura without status migrainosus  G43.119     5. Insomnia, unspecified type  G47.00       Meds ordered this encounter  Medications   amitriptyline (ELAVIL) 10 MG tablet    Sig: Take 1 tablet (10 mg total) by mouth at bedtime.    Dispense:  90 tablet    Refill:  3    Return precautions advised.  [name], MD

## 2022-11-24 NOTE — Patient Instructions (Addendum)
Try 10 mg amitriptyline instead of 25 mg- let me know how this goes in a month or so  No other changes today  Recommended follow up: Return in about 6 months (around 05/25/2023) for physical or sooner if needed.Schedule b4 you leave.

## 2022-12-08 ENCOUNTER — Other Ambulatory Visit (HOSPITAL_COMMUNITY): Payer: Self-pay

## 2022-12-09 ENCOUNTER — Encounter: Payer: Self-pay | Admitting: Family Medicine

## 2022-12-09 ENCOUNTER — Other Ambulatory Visit: Payer: Self-pay

## 2022-12-09 MED ORDER — MESALAMINE 1000 MG RE SUPP: 1000.0000 mg | Freq: Every day | RECTAL | 5 refills | Status: AC

## 2022-12-30 ENCOUNTER — Encounter: Payer: Self-pay | Admitting: Family Medicine

## 2023-01-02 ENCOUNTER — Other Ambulatory Visit: Payer: Self-pay

## 2023-01-02 MED ORDER — NORTRIPTYLINE HCL 25 MG PO CAPS: 25.0000 mg | ORAL_CAPSULE | Freq: Every day | ORAL | 2 refills | Status: DC

## 2023-01-03 ENCOUNTER — Other Ambulatory Visit: Payer: Self-pay

## 2023-01-05 ENCOUNTER — Other Ambulatory Visit: Payer: Self-pay

## 2023-01-11 ENCOUNTER — Ambulatory Visit: Payer: BC Managed Care – PPO | Admitting: Neurology

## 2023-02-02 ENCOUNTER — Encounter: Payer: Self-pay | Admitting: Family Medicine

## 2023-02-02 ENCOUNTER — Other Ambulatory Visit: Payer: Self-pay

## 2023-02-02 MED ORDER — AMITRIPTYLINE HCL 10 MG PO TABS: 10.0000 mg | ORAL_TABLET | Freq: Every day | ORAL | 3 refills | Status: DC

## 2023-02-02 MED ORDER — LOSARTAN POTASSIUM 50 MG PO TABS: 50.0000 mg | ORAL_TABLET | Freq: Every day | ORAL | 3 refills | Status: DC

## 2023-02-06 ENCOUNTER — Other Ambulatory Visit: Payer: Self-pay

## 2023-02-07 ENCOUNTER — Other Ambulatory Visit: Payer: Self-pay

## 2023-02-07 MED ORDER — AMITRIPTYLINE HCL 25 MG PO TABS: 25.0000 mg | ORAL_TABLET | Freq: Every day | ORAL | 3 refills | Status: DC

## 2023-02-13 ENCOUNTER — Other Ambulatory Visit: Payer: Self-pay

## 2023-02-13 ENCOUNTER — Other Ambulatory Visit: Payer: Self-pay | Admitting: Family Medicine

## 2023-03-10 ENCOUNTER — Other Ambulatory Visit: Payer: Self-pay

## 2023-03-13 ENCOUNTER — Encounter: Payer: Self-pay | Admitting: Gastroenterology

## 2023-03-16 ENCOUNTER — Encounter: Payer: Self-pay | Admitting: Gastroenterology

## 2023-03-18 ENCOUNTER — Ambulatory Visit (HOSPITAL_COMMUNITY)
Admission: RE | Admit: 2023-03-18 | Discharge: 2023-03-18 | Disposition: A | Discharge status: Home / Self Care | Payer: BC Managed Care – PPO | Source: Ambulatory Visit | Attending: Emergency Medicine | Admitting: Emergency Medicine

## 2023-03-18 ENCOUNTER — Encounter (HOSPITAL_COMMUNITY): Payer: Self-pay

## 2023-03-18 VITALS — BP 119/77 | HR 87 | Temp 98.8°F | Resp 18 | Ht 70.0 in | Wt 180.0 lb

## 2023-03-18 DIAGNOSIS — H109 Unspecified conjunctivitis: Secondary | ICD-10-CM | POA: Diagnosis not present

## 2023-03-18 MED ORDER — MOXIFLOXACIN HCL 0.5 % OP SOLN: 1.0000 [drp] | Freq: Three times a day (TID) | OPHTHALMIC | 0 refills | Status: DC

## 2023-03-18 NOTE — ED Triage Notes (Signed)
Patient here today with c/o right eye pain and discomfort since Thursday. Seems to be worsening. He has been using Retaine for dry eye.

## 2023-03-18 NOTE — ED Provider Notes (Signed)
[facility]    CSN: [id number] Arrival date & time: 03/18/23  1423      History   Chief Complaint Chief Complaint  Patient presents with   Eye Problem    Maybe pink eye or other eye infection? - Entered by patient    HPI [name] is a 62 y.o. male.   Presents for evaluation of erythema to the right and irritation present for 3 days.  Symptoms seem to be worsening.  Has attempted use of lubricant eyedrops which has been minimally helpful.  Denies pruritus, drainage, visual disturbance or light sensitivity, injury or trauma to the eye denies use of contacts.     Past Medical History:  Diagnosis Date   Allergic rhinitis    Allergy    seasonal   Arthritis    GERD (gastroesophageal reflux disease)    Gilbert syndrome    Heart murmur    tricuspid valve regurgitation   Hyperlipidemia    Hypertension    Internal hemorrhoids    Migraines    Palpitations    Panic attack    occasional   Syncope    on airplane in 12/2016/ due to stress   Ulcerative proctosigmoiditis (HCC) 2001    Patient Active Problem List   Diagnosis Date Noted   Insomnia 06/23/2022   Raynaud's disease 11/15/2019   Essential hypertension 03/12/2019   Major depression, recurrent, full remission (HCC) 08/16/2018   Family history of alpha 1 antitrypsin deficiency 06/14/2018   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  Follows with Dr. 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 Authorizing Provider  amitriptyline (ELAVIL) 25 MG tablet Take 1 tablet (25 mg total) by mouth at bedtime. 02/07/23  Yes Shelva Majestic, MD  balsalazide (COLAZAL) 750 MG capsule TAKE 3 CAPSULES 3 TIMES    DAILY 02/13/23  Yes Shelva Majestic, MD  cetirizine (ZYRTEC) 10 MG tablet Take 10 mg by mouth daily.   Yes [provider]  chlorthalidone (HYGROTON) 25 MG tablet TAKE 1 TABLET DAILY 02/13/23  Yes Shelva Majestic, MD  diphenhydrAMINE (BENADRYL) 25 MG tablet Take 25 mg by mouth at bedtime as needed.   Yes [provider]  fluticasone (FLONASE) 50 MCG/ACT nasal spray USE 1 SPRAY IN EACH NOSTRILTWICE DAILY 03/08/22  Yes Shelva Majestic, MD  Fremanezumab-vfrm (AJOVY) 225 MG/1.5ML SOAJ Inject 225 mg into the skin every 28 (twenty-eight) days. 11/16/22  Yes Jaffe, Adam R, DO  ibuprofen (ADVIL,MOTRIN) 200 MG tablet Take 200 mg by mouth as needed.     Yes [provider]  losartan (COZAAR) 50 MG tablet Take 1 tablet (50  mg total) by mouth daily. 02/02/23  Yes Shelva Majestic, MD  Melatonin 5 MG CAPS Take 1 capsule by mouth at bedtime.   Yes [provider]  metoprolol succinate (TOPROL-XL) 25 MG 24 hr tablet TAKE 1 TABLET DAILY 06/17/22  Yes Shelva Majestic, MD  SUMAtriptan (IMITREX) 100 MG tablet TAKE 1 TABLET AS NEEDED FOR MIGRAINE. MAY REPEAT AFTER 2 HOURS. MAXIMUM 2 TABLETS IN 24 HOURS 11/16/22  Yes Drema Dallas, DO  Vitamin D, Cholecalciferol, 50 MCG (2000 UT) CAPS Take 1 capsule by mouth daily.   Yes [provider]  zonisamide (ZONEGRAN) 100 MG capsule Take 2 capsules (200 mg total) by mouth daily. 11/16/22  Yes Everlena Cooper, Adam R, DO  mesalamine (CANASA) 1000 MG suppository Place 1 suppository  (1,000 mg total) rectally at bedtime. 12/09/22   Shelva Majestic, MD  nortriptyline (PAMELOR) 25 MG capsule Take 1 capsule (25 mg total) by mouth at bedtime. 07/18/22   Shelva Majestic, MD  sildenafil (VIAGRA) 100 MG tablet Take 1 tablet (100 mg total) by mouth daily as needed for erectile dysfunction. 90 day supply 08/11/21   Shelva Majestic, MD  SUMAtriptan 6 MG/0.5ML SOAJ Inject 6 mg into the skin as needed. May repeat after 1 hour.  Maximum 2 doses in 24 hours. 11/16/22   Drema Dallas, DO  glycopyrrolate (ROBINUL) 2 MG tablet Take 2 mg by mouth 2 (two) times daily.    05/20/11  [provider]    Family History Family History  Problem Relation Age of Onset   Arthritis Mother    Thyroid disease Father    Heart disease Father        a fib, expanded aorta   Heart disease Brother        marfanoid related- connective tissue related   Colon polyps Brother    Other Brother        #small vessel ischemic disease in brother- no evidence of stroke. Some memory changes. Thyroid/b12 etc workup.   Colon polyps Maternal Grandmother        [employer], Great Uncle   Crohn's disease Paternal Aunt    Crohn's disease Cousin    Colon cancer Other        Grandmother's Family (x 1 brother to grandmother and 1 sister to grandmother)   Esophageal cancer Neg Hx    Stomach cancer Neg Hx    Rectal cancer Neg Hx     Social History Social History   Tobacco Use   Smoking status: Never   Smokeless tobacco: Never  Vaping Use   Vaping Use: Never used  Substance Use Topics   Alcohol use: Yes    Comment: 2 a day/14 a week   Drug use: No     Allergies   Shellfish allergy, Citrus, Codeine, and Salmon [fish allergy]   Review of Systems Review of Systems   Physical Exam Triage Vital Signs ED Triage Vitals [03/18/23 1430]  Enc Vitals Group     BP 119/77     Pulse Rate 87     Resp 18     Temp 98.8 F (37.1 C)     Temp Source Oral     SpO2 96 %     Weight 180 lb (81.6 kg)      Height 5\' 10"  (1.778 m)     Head Circumference      Peak Flow      Pain Score 3     Pain Loc  Pain Edu?      Excl. in GC?    No data found.  Updated Vital Signs BP 119/77 (BP Location: Left Arm)   Pulse 87   Temp 98.8 F (37.1 C) (Oral)   Resp 18   Ht 5\' 10"  (1.778 m)   Wt 180 lb (81.6 kg)   SpO2 96%   BMI 25.83 kg/m   Visual Acuity Right Eye Distance:   Left Eye Distance:   Bilateral Distance:    Right Eye Near:   Left Eye Near:    Bilateral Near:     Physical Exam Constitutional:      Appearance: Normal appearance.  HENT:     Head: Normocephalic.  Eyes:     Comments: Erythema present to the right conjunctiva following the water line, no drainage, swelling present, no sign of lesion or mass along the eye, vision is grossly intact, extraocular movements intact  Pulmonary:     Effort: Pulmonary effort is normal.  Skin:    General: Skin is warm and dry.  Neurological:     Mental Status: He is alert and oriented to person, place, and time. Mental status is at baseline.      UC Treatments / Results  Labs (all labs ordered are listed, but only abnormal results are displayed) Labs Reviewed - No data to display  EKG   Radiology No results found.  Procedures Procedures (including critical care time)  Medications Ordered in UC Medications - No data to display  Initial Impression / Assessment and Plan / UC Course  I have reviewed the triage vital signs and the nursing notes.  Pertinent labs & imaging results that were available during my care of the patient were reviewed by me and considered in my medical decision making (see chart for details).  Bacterial  conjunctivitis right eye  Atypical presentation but due to persisting and worsening redness we will provide bacterial coverage, moxifloxacin prescribed and discussed administration, advised against touching or rubbing to prevent further irritation and spread, may continue use of lubricant eyedrops  if helpful with managing irritation, may follow-up if symptoms continue to persist or worsen  Final Clinical Impressions(s) / UC Diagnoses   Final diagnoses:  None   Discharge Instructions   None    ED Prescriptions   None    PDMP not reviewed this encounter.   Valinda Hoar, NP 03/18/23 1524

## 2023-03-18 NOTE — Discharge Instructions (Signed)
Today you being treated for bacterial conjunctivitis.  I do not have the typical presentation there is redness on exam and irritation  Place one drop of moxifloxacin into the right eye every 8 hours for 7 days if the other eye starts to have symptoms you may use medication in it as well. Do not allow tip of dropper to touch eye.  May use cool compress for comfort and to remove discharge if present. Pat the eye, do not wipe.  If wearing contacts, dispose of current pair. Wear glasses until symptoms have resolved.   Do not rub eyes, this may cause more irritation.  If symptoms persist after use of medication, please follow up at Urgent Care or with ophthalmologist (eye doctor)   If you begin to have redness, swelling or puslike drainage to the eyes lids or the area underneath the eye please follow-up for reevaluation

## 2023-03-27 ENCOUNTER — Other Ambulatory Visit: Payer: Self-pay

## 2023-03-27 MED ORDER — TRIAMCINOLONE ACETONIDE 0.1 % EX OINT
TOPICAL_OINTMENT | CUTANEOUS | 0 refills | Status: DC
  Filled 2023-03-27: qty 15, 14d supply, fill #0

## 2023-03-27 MED ORDER — KETOCONAZOLE 2 % EX CREA
TOPICAL_CREAM | CUTANEOUS | 0 refills | Status: DC
  Filled 2023-03-27: qty 120, 42d supply, fill #0

## 2023-03-28 ENCOUNTER — Other Ambulatory Visit: Payer: Self-pay

## 2023-03-30 ENCOUNTER — Encounter: Payer: Self-pay | Admitting: Physician Assistant

## 2023-03-30 ENCOUNTER — Other Ambulatory Visit (HOSPITAL_COMMUNITY)
Admission: RE | Admit: 2023-03-30 | Discharge: 2023-03-30 | Disposition: A | Discharge status: Home / Self Care | Payer: BC Managed Care – PPO | Source: Ambulatory Visit | Attending: Physician Assistant | Admitting: Physician Assistant

## 2023-03-30 ENCOUNTER — Ambulatory Visit: Payer: BC Managed Care – PPO | Admitting: Physician Assistant

## 2023-03-30 ENCOUNTER — Telehealth: Payer: Self-pay | Admitting: *Deleted

## 2023-03-30 VITALS — BP 126/88 | HR 78 | Temp 96.4°F | Ht 70.0 in | Wt 187.2 lb

## 2023-03-30 DIAGNOSIS — R300 Dysuria: Secondary | ICD-10-CM

## 2023-03-30 DIAGNOSIS — N4889 Other specified disorders of penis: Secondary | ICD-10-CM

## 2023-03-30 LAB — POCT URINALYSIS DIPSTICK
Bilirubin, UA: NEGATIVE
Blood, UA: NEGATIVE
Glucose, UA: NEGATIVE
Ketones, UA: NEGATIVE
Leukocytes, UA: NEGATIVE
Nitrite, UA: NEGATIVE
Protein, UA: NEGATIVE
Spec Grav, UA: 1.015 (ref 1.010–1.025)
Urobilinogen, UA: 0.2 E.U./dL
pH, UA: 6 (ref 5.0–8.0)

## 2023-03-30 LAB — URINALYSIS, MICROSCOPIC ONLY
RBC / HPF: NONE SEEN (ref 0–?)
WBC, UA: NONE SEEN (ref 0–?)

## 2023-03-30 NOTE — Patient Instructions (Signed)
It was great to see you!  We will be in touch with all results  Trial 50:50 mix of triamcinolone and ketoconazole cream to the area of concern to see if this helps  Take care,  Jarold Motto PA-C

## 2023-03-30 NOTE — Telephone Encounter (Signed)
Arianna from the lab called to say that they no longer do Gardnerella and candida on urine. 

## 2023-03-30 NOTE — Progress Notes (Signed)
[name] is a 62 y.o. male here for a new problem.  History of Present Illness:   Chief Complaint  Patient presents with   Urinary Tract Infection    Pain in the penis  about 2 weeks , pressure  , burning, pelvic pain  lower back. No fever , possible STD  testing.  Possible expose     HPI  Possible urinary tract infection Patient reports burning with urination x 2 weeks He has some pressure near his bladder Has pain at his urethra and skin sensitivity to the left side of his penis  Had possible STD exposure in March and would like full testing for this  Did have recent upper respiratory infection (URI) with conjunctivitis in June that was treated with topical Moxifloxacin drops -- denies any ongoing symptom(s).  He recently went to dermatology and was found to have tinea on his back (has three areas and is currently using topical ketoconazole cream - history of this), and he did have derm evaluate his penile rash and was given triamcinolone to use - has used for about 4 days with no change. Gets a slight burning sensation to this area of his penis.  Denies: tick bites, recent travel (did travel to [place] in February but no obvious illness while there), perineum discomfort, diarrhea  Has history of illness after significant life events or after doing a lot of work; recently has been through a lot of stress at work and is wondering if this is on of those situations.   Past Medical History:  Diagnosis Date   Allergic rhinitis    Allergy    seasonal   Arthritis    GERD (gastroesophageal reflux disease)    Gilbert syndrome    Heart murmur    tricuspid valve regurgitation   Hyperlipidemia    Hypertension    Internal hemorrhoids    Migraines    Palpitations    Panic attack    occasional   Syncope    on airplane in 12/2016/ due to stress   Ulcerative proctosigmoiditis (HCC) 2001     Social History   Tobacco Use   Smoking status: Never   Smokeless tobacco: Never   Vaping Use   Vaping Use: Never used  Substance Use Topics   Alcohol use: Yes    Comment: 2 a day/14 a week   Drug use: No    Past Surgical History:  Procedure Laterality Date   ADENOIDECTOMY     as a child   COLONOSCOPY  2018   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 Some memory changes. Thyroid/b12 etc workup.   Colon polyps Maternal Grandmother        [employer], Great Uncle   Crohn's disease Paternal Aunt    Crohn's disease Cousin    Colon cancer Other        Grandmother's Family (x 1 brother to grandmother and 1 sister to grandmother)   Esophageal cancer Neg  Hx    Stomach cancer Neg Hx    Rectal cancer Neg Hx     Allergies  Allergen Reactions   Shellfish Allergy Nausea And Vomiting    scallops   Citrus     Oral pain, rash in mouth   Codeine Nausea Only   Salmon [Fish Allergy] Nausea Only and Rash    Especially smoked salmon    Current Medications:   Current Outpatient Medications:    amitriptyline (ELAVIL) 25 MG tablet, Take 1 tablet (25 mg total) by mouth at bedtime., Disp: 90 tablet, Rfl: 3   balsalazide (COLAZAL) 750 MG capsule, TAKE 3 CAPSULES 3 TIMES    DAILY, Disp: 810 capsule, Rfl: 1   cetirizine (ZYRTEC) 10 MG tablet, Take 10 mg by mouth daily., Disp: , Rfl:    chlorthalidone (HYGROTON) 25 MG tablet, TAKE 1 TABLET DAILY, Disp: 90 tablet, Rfl: 3   diphenhydrAMINE (BENADRYL) 25 MG tablet, Take 25 mg by mouth at bedtime as needed., Disp: , Rfl:    fluticasone (FLONASE) 50 MCG/ACT nasal spray, USE 1 SPRAY IN EACH NOSTRILTWICE DAILY, Disp: 48 g, Rfl: 3   Fremanezumab-vfrm  (AJOVY) 225 MG/1.5ML SOAJ, Inject 225 mg into the skin every 28 (twenty-eight) days., Disp: 1.5 mL, Rfl: 5   ibuprofen (ADVIL,MOTRIN) 200 MG tablet, Take 200 mg by mouth as needed.  , Disp: , Rfl:    ketoconazole (NIZORAL) 2 % cream, Apply 1 application to affected area externally twice a day for feet, once a day for the back 42 days, Disp: 120 g, Rfl: 0   losartan (COZAAR) 50 MG tablet, Take 1 tablet (50 mg total) by mouth daily., Disp: 90 tablet, Rfl: 3   Melatonin 5 MG CAPS, Take 1 capsule by mouth at bedtime., Disp: , Rfl:    mesalamine (CANASA) 1000 MG suppository, Place 1 suppository (1,000 mg total) rectally at bedtime., Disp: 30 suppository, Rfl: 5   metoprolol succinate (TOPROL-XL) 25 MG 24 hr tablet, TAKE 1 TABLET DAILY, Disp: 90 tablet, Rfl: 3   moxifloxacin (VIGAMOX) 0.5 % ophthalmic solution, Place 1 drop into the right eye 3 (three) times daily., Disp: 3 mL, Rfl: 0   SUMAtriptan (IMITREX) 100 MG tablet, TAKE 1 TABLET AS NEEDED FOR MIGRAINE. MAY REPEAT AFTER 2 HOURS. MAXIMUM 2 TABLETS IN 24 HOURS, Disp: 9 tablet, Rfl: 5   SUMAtriptan 6 MG/0.5ML SOAJ, Inject 6 mg into the skin as needed. May repeat after 1 hour.  Maximum 2 doses in 24 hours., Disp: 3 mL, Rfl: 5   triamcinolone ointment (KENALOG) 0.1 %, Apply 1 application externally once a day 14 days, Disp: 15 g, Rfl: 0   Vitamin D, Cholecalciferol, 50 MCG (2000 UT) CAPS, Take 1 capsule by mouth daily., Disp: , Rfl:    zonisamide (ZONEGRAN) 100 MG capsule, Take 2 capsules (200 mg total) by mouth daily., Disp: 180 capsule, Rfl: 1   nortriptyline (PAMELOR) 25 MG capsule, Take 1 capsule (25 mg total) by mouth at bedtime., Disp: 90 capsule, Rfl: 2   sildenafil (VIAGRA) 100 MG tablet, Take 1 tablet (100 mg total) by mouth daily as needed for erectile dysfunction. 90 day supply, Disp: 30 tablet, Rfl: 3   Review of Systems:   ROS Negative unless otherwise specified per HPI.  Vitals:   Vitals:   03/30/23 1137  BP: 126/88  Pulse: 78   Temp: (!) 96.4 F (35.8 C)  SpO2: 98%  Weight: 187 lb 3.2 oz (84.9 kg)  Height: 5\' 10"  (1.778 m)  Body mass index is 26.86 kg/m.  Physical Exam:   Physical Exam Vitals and nursing note reviewed. Exam conducted with a chaperone present.  Constitutional:      General: He is not in acute distress.    Appearance: He is well-developed. He is not ill-appearing or toxic-appearing.  Cardiovascular:     Rate and Rhythm: Normal rate and regular rhythm.     Pulses: Normal pulses.     Heart sounds: Normal heart sounds, S1 normal and S2 normal.  Pulmonary:     Effort: Pulmonary effort is normal.     Breath sounds: Normal breath sounds.  Genitourinary:    Pubic Area: No rash.      Testes: Normal.     Comments: No obvious rash/lesions present Skin:    General: Skin is warm and dry.  Neurological:     Mental Status: He is alert.     GCS: GCS eye subscore is 4. GCS verbal subscore is 5. GCS motor subscore is 6.  Psychiatric:        Speech: Speech normal.        Behavior: Behavior normal. Behavior is cooperative.    Results for orders placed or performed in visit on 03/30/23  Urine Microscopic Only  Result Value Ref Range   WBC, UA none seen 0-2/hpf   RBC / HPF none seen 0-2/hpf  Urinalysis Dipstick  Result Value Ref Range   Color, UA yellow    Clarity, UA clear    Glucose, UA Negative Negative   Bilirubin, UA neg    Ketones, UA neg    Spec Grav, UA 1.015 1.010 - 1.025   Blood, UA neg    pH, UA 6.0 5.0 - 8.0   Protein, UA Negative Negative   Urobilinogen, UA 0.2 0.2 or 1.0 E.U./dL   Nitrite, UA neg    Leukocytes, UA Negative Negative   Appearance     Odor      Assessment and Plan:   Burning with urination Point of Care (POC) urinalysis wnl Will add on additional urine micro and culture Will add urine culture and treat accordingly Will add full sexual transmitted infection panel and treat accordingly If any further/lingering issues -- recommend close follow-up  with Primary Care Provider (PCP) or consider urology referral  Penile pain No red flags Unclear etiology Recommend trialing 50/50 mix of triamcinolone and ketoconazole to area and apply to area Discouraged further application to urethral area If ongoing, lack of improvement or new concerns, recommend close follow-up with PCP or urology referral   I,Emily Lagle,acting as a scribe for Jarold Motto, PA.,have documented all relevant documentation on the behalf of Jarold Motto, PA,as directed by  Jarold Motto, PA while in the presence of Jarold Motto, [place].   I, Jarold Motto, [place], have reviewed all documentation for this visit. The documentation on 03/30/23 for the exam, diagnosis, procedures, and orders are all accurate and complete.  Jarold Motto, PA-C

## 2023-03-31 ENCOUNTER — Encounter: Payer: Self-pay | Admitting: Physician Assistant

## 2023-03-31 LAB — URINE CYTOLOGY ANCILLARY ONLY
Chlamydia: NEGATIVE
Comment: NEGATIVE
Comment: NEGATIVE
Comment: NORMAL
Neisseria Gonorrhea: NEGATIVE
Trichomonas: NEGATIVE

## 2023-03-31 LAB — HIV ANTIBODY (ROUTINE TESTING W REFLEX): HIV 1&2 Ab, 4th Generation: NONREACTIVE

## 2023-04-03 NOTE — Telephone Encounter (Signed)
Please see message. °

## 2023-04-05 ENCOUNTER — Encounter: Payer: Self-pay | Admitting: Family Medicine

## 2023-04-05 ENCOUNTER — Ambulatory Visit: Payer: BC Managed Care – PPO | Admitting: Family Medicine

## 2023-04-05 ENCOUNTER — Other Ambulatory Visit: Payer: Self-pay

## 2023-04-05 ENCOUNTER — Other Ambulatory Visit (HOSPITAL_COMMUNITY)
Admission: RE | Admit: 2023-04-05 | Discharge: 2023-04-05 | Disposition: A | Discharge status: Home / Self Care | Payer: BC Managed Care – PPO | Source: Ambulatory Visit | Attending: Family Medicine | Admitting: Family Medicine

## 2023-04-05 VITALS — BP 150/100 | HR 99 | Temp 97.9°F | Ht 70.0 in | Wt 187.2 lb

## 2023-04-05 DIAGNOSIS — R300 Dysuria: Secondary | ICD-10-CM

## 2023-04-05 DIAGNOSIS — I10 Essential (primary) hypertension: Secondary | ICD-10-CM | POA: Diagnosis not present

## 2023-04-05 DIAGNOSIS — N451 Epididymitis: Secondary | ICD-10-CM

## 2023-04-05 LAB — HSV(HERPES SIMPLEX VRS) I + II AB-IGG
HAV 1 IGG,TYPE SPECIFIC AB: <0.9 index
HSV 2 IGG,TYPE SPECIFIC AB: <0.9 index

## 2023-04-05 LAB — RPR: RPR Ser Ql: NONREACTIVE

## 2023-04-05 LAB — URINE CULTURE

## 2023-04-05 MED ORDER — LEVOFLOXACIN 500 MG PO TABS
500.0000 mg | ORAL_TABLET | Freq: Every day | ORAL | 0 refills | Status: DC
  Filled 2023-04-05: qty 10, 10d supply, fill #0

## 2023-04-05 NOTE — Progress Notes (Signed)
Subjective  CC:  Chief Complaint  Patient presents with   Pelvic Pain   pain at the head of the penis    discomfort at the testicle   Same day acute visit; PCP not available. New pt to me. Chart reviewed.   HPI: [name] is a 62 y.o. male who presents to the office today to address the problems listed above in the chief complaint. 62 year old male evaluated in office 6 days ago for dysuria and pain in the shaft of the penis.  I reviewed that note.  STD screens and urine testing was negative at the time.  Patient presents due to worsening symptoms.  Symptoms started with dysuria and pain on the left side of the shaft of the penis.  He was evaluated by his dermatologist and treated with triamcinolone cream.  No rash was present at that time.  Since he describes left testicular discomfort and heaviness, left inguinal discomfort, persistent dysuria although worse when urine is concentrated, without fevers, chills or significant nausea vomiting or diarrhea.  He has no significant low back pain.  He has history of colitis and proctitis.  No history of prostatitis.  He denies penile discharge or rash.  Assessment  1. Epididymitis, left   2. Dysuria      Plan  Constellation of symptoms are most consistent with left epididymitis: Treat with Levaquin 500 mg daily for 10 days.  Recheck urine chlamydia and gonorrhea to be sure they are negative.  Recheck urine although first urinalysis was negative.  Reassured.  Prostate was not tender on exam.  Follow-up in 1 week if not improving.  Follow up: As needed 05/26/2023  Orders Placed This Encounter  Procedures   Urine Culture   CBC with Differential/Platelet   Urinalysis, Routine w reflex microscopic   Sedimentation rate   C-reactive protein   Meds ordered this encounter  Medications   levofloxacin (LEVAQUIN) 500 MG tablet    Sig: Take 1 tablet (500 mg total) by mouth daily.    Dispense:  10 tablet    Refill:  0      I reviewed the  patients updated PMH, FH, and SocHx.    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  Follows with Dr. Russella Dar of [facility] 05/06/2008   GERD 01/31/2008   INTERNAL HEMORRHOIDS 01/23/2008   Hyperlipidemia 05/15/2007   Allergic rhinitis 05/03/2007   Migraines 05/03/2007   Current Meds  Medication Sig   amitriptyline (ELAVIL) 25 MG tablet Take 1 tablet (25 mg total) by mouth at bedtime.   balsalazide (COLAZAL) 750 MG capsule TAKE 3 CAPSULES 3 TIMES    DAILY   cetirizine (ZYRTEC) 10 MG tablet Take 10 mg by mouth daily.   chlorthalidone (HYGROTON) 25 MG tablet TAKE 1 TABLET DAILY   diphenhydrAMINE (BENADRYL) 25 MG tablet Take 25 mg by mouth at bedtime as needed.   fluticasone (FLONASE) 50  MCG/ACT nasal spray USE 1 SPRAY IN EACH NOSTRILTWICE DAILY   Fremanezumab-vfrm (AJOVY) 225 MG/1.5ML SOAJ Inject 225 mg into the skin every 28 (twenty-eight) days.   ibuprofen (ADVIL,MOTRIN) 200 MG tablet Take 200 mg by mouth as needed.     ketoconazole (NIZORAL) 2 % cream Apply 1 application to affected area externally twice a day for feet, once a day for the back 42 days   levofloxacin (LEVAQUIN) 500 MG tablet Take 1 tablet (500 mg total) by mouth daily.   losartan (COZAAR) 50 MG tablet Take 1 tablet (50 mg total) by mouth daily.   Melatonin 5 MG CAPS Take 1 capsule by mouth at bedtime.   mesalamine  (CANASA) 1000 MG suppository Place 1 suppository (1,000 mg total) rectally at bedtime.   metoprolol succinate (TOPROL-XL) 25 MG 24 hr tablet TAKE 1 TABLET DAILY   moxifloxacin (VIGAMOX) 0.5 % ophthalmic solution Place 1 drop into the right eye 3 (three) times daily.   SUMAtriptan (IMITREX) 100 MG tablet TAKE 1 TABLET AS NEEDED FOR MIGRAINE. MAY REPEAT AFTER 2 HOURS. MAXIMUM 2 TABLETS IN 24 HOURS   SUMAtriptan 6 MG/0.5ML SOAJ Inject 6 mg into the skin as needed. May repeat after 1 hour.  Maximum 2 doses in 24 hours.   triamcinolone ointment (KENALOG) 0.1 % Apply 1 application externally once a day 14 days   Vitamin D, Cholecalciferol, 50 MCG (2000 UT) CAPS Take 1 capsule by mouth daily.   zonisamide (ZONEGRAN) 100 MG capsule Take 2 capsules (200 mg total) by mouth daily.    Allergies: Patient is allergic to shellfish allergy, citrus, codeine, and salmon [fish allergy]. Family History: Patient family history includes Arthritis in his mother; Colon cancer in an other family member; Colon polyps in his brother and maternal grandmother; Crohn's disease in his cousin and paternal aunt; Heart disease in his brother and father; Other in his brother; Thyroid disease in his father. Social History:  Patient  reports that he has never smoked. He has never used smokeless tobacco. He reports current alcohol use. He reports that he does not use drugs.  Review of Systems: Constitutional: Negative for fever malaise or anorexia Cardiovascular: negative for chest pain Respiratory: negative for SOB or persistent cough Gastrointestinal: negative for abdominal pain  Objective  Vitals: BP (!) 150/100   Pulse 99   Temp 97.9 F (36.6 C)   Ht 5\' 10"  (1.778 m)   Wt 187 lb 3.2 oz (84.9 kg)   SpO2 98%   BMI 26.86 kg/m  General: no acute distress , A&Ox3 GU: Urethral opening with 2 red papules, mildly irritated, no discharge, shaft of penis is clear.  Right testicle is normal and nontender, left testicle with  tenderness of epididymitis which reproduces pain.  No inguinal hernias, no inguinal lymphadenopathy, no suprapubic tenderness Rectal exam: Nontender prostate palpated  No visits with results within 1 Day(s) from this visit.  Latest known visit with results is:  Office Visit on 03/30/2023  Component Date Value Ref Range Status   Color, UA 03/30/2023 yellow   Final   Clarity, UA 03/30/2023 clear   Final   Glucose, UA 03/30/2023 Negative  Negative Final   Bilirubin, UA 03/30/2023 neg   Final   Ketones, UA 03/30/2023 neg   Final   Spec Grav, UA 03/30/2023 1.015  1.010 - 1.025 Final   Blood, UA 03/30/2023 neg   Final   pH, UA 03/30/2023 6.0  5.0 - 8.0 Final   Protein, UA 03/30/2023 Negative  Negative  Final   Urobilinogen, UA 03/30/2023 0.2  0.2 or 1.0 E.U./dL Final   Nitrite, UA 13/05/6577 neg   Final   Leukocytes, UA 03/30/2023 Negative  Negative Final   Neisseria Gonorrhea 03/30/2023 Negative   Final   Chlamydia 03/30/2023 Negative   Final   Trichomonas 03/30/2023 Negative   Final   Comment 03/30/2023 Normal Reference Range Trichomonas - Negative   Final   Comment 03/30/2023 Normal Reference Ranger Chlamydia - Negative   Final   Comment 03/30/2023 Normal Reference Range Neisseria Gonorrhea - Negative   Final   HIV 1&2 Ab, 4th Generation 03/30/2023 NON-REACTIVE  NON-REACTIVE Final   RPR Ser Ql 03/30/2023 NON-REACTIVE  NON-REACTIVE Final   HAV 1 IGG,TYPE SPECIFIC AB 03/30/2023 <0.90  index Final   HSV 2 IGG,TYPE SPECIFIC AB 03/30/2023 <0.90  index Final   WBC, UA 03/30/2023 none seen  0-2/hpf Final   RBC / HPF 03/30/2023 none seen  0-2/hpf Final   MICRO NUMBER: 03/30/2023 CANCELED   Final   SPECIMEN QUALITY: 03/30/2023 CANCELED   Final   Sample Source 03/30/2023 CANCELED   Final   STATUS: 03/30/2023 CANCELED   Final   Result: 03/30/2023 CANCELED   Final    Commons side effects, risks, benefits, and alternatives for medications and treatment plan prescribed today were discussed, and  the patient expressed understanding of the given instructions. Patient is instructed to call or message via MyChart if he/she has any questions or concerns regarding our treatment plan. No barriers to understanding were identified. We discussed Red Flag symptoms and signs in detail. Patient expressed understanding regarding what to do in case of urgent or emergency type symptoms.  Medication list was reconciled, printed and provided to the patient in AVS. Patient instructions and summary information was reviewed with the patient as documented in the AVS. This note was prepared with assistance of Dragon voice recognition software. Occasional wrong-word or sound-a-like substitutions may have occurred due to the inherent limitations of voice recognition software

## 2023-04-05 NOTE — Patient Instructions (Signed)
Please follow up if symptoms do not improve or as needed.    Epididymitis  Epididymitis is inflammation or swelling of the epididymis. This is caused by an infection. The epididymis is a cord-like structure that is located along the top and back part of the testicle. It collects and stores sperm from the testicle. This condition can also cause pain and swelling of the testicle and scrotum. Symptoms usually start suddenly (acute epididymitis). Sometimes epididymitis starts gradually and lasts for a while (chronic epididymitis). Chronic epididymitis may be harder to treat. What are the causes? In men ages 80-40, this condition is usually caused by a bacterial infection or a sexually transmitted infection (STI), such as gonorrhea or chlamydia. In men 32 and older, this condition is usually caused by bacteria from a urinary blockage or from abnormalities in the urinary system. These can result from: Having a tube placed into the bladder (urinary catheter). Having an enlarged or inflamed prostate gland. Having recently had urinary tract surgery. Having a problem with a backward flow of urine (retrograde). In men who have a condition that weakens the body's defense system (immune system), such as human immunodeficiency virus (HIV), this condition can be caused by: Other bacteria, including tuberculosis and syphilis. Viruses. Fungi. Sometimes this condition occurs without infection. This may happen because of trauma or repetitive activities such as sports. What increases the risk? You are more likely to develop this condition if you have: Unprotected sex with more than one partner. Anal sex. Had recent surgery. A urinary catheter. Urinary problems. A suppressed immune system. What are the signs or symptoms? This condition usually begins suddenly with chills, fever, and pain behind the scrotum and in the testicle. Other symptoms include: Swelling of the scrotum, testicle, or both. Pain when  ejaculating or urinating. Pain in the back or abdomen. Nausea. Itching and discharge from the penis. A frequent need to pass urine. Redness, increased warmth, and tenderness of the scrotum. How is this diagnosed? Your health care provider can diagnose this condition based on your symptoms and medical history. Your health care provider will also do a physical exam to check your scrotum and testicle for swelling, pain, and redness. You may also have other tests, including: Testing of discharge from the penis. Testing your urine for infections, such as STIs. Ultrasound to check for blood flow and inflammation. Your health care provider may test you for other STIs, including HIV. How is this treated? Treatment for this condition depends on the cause. If your condition is caused by a bacterial infection, oral antibiotic medicine may be prescribed. If the bacterial infection has spread to your blood, you may need to receive IV antibiotics. For both bacterial and nonbacterial epididymitis, you may be treated with: Rest. Elevation of the scrotum. Pain medicines. Anti-inflammatory medicines. Surgery may be needed if: You have pus buildup in the scrotum (abscess). You have epididymitis that has not responded to other treatments. Follow these instructions at home: Medicines Take over-the-counter and prescription medicines only as told by your health care provider. If you were prescribed an antibiotic medicine, take it as told by your health care provider. Do not stop taking the antibiotic even if your condition improves. Sexual activity If your epididymitis was caused by an STI, avoid sexual activity until your treatment is complete. Inform your sexual partner or partners if you test positive for an STI. They may need to be treated. Do not engage in sexual activity with your partner or partners until their treatment is completed.  Managing pain and swelling  If directed, raise (elevate) your  scrotum and apply ice. To do this: Put ice in a plastic bag. Place a small towel or pillow between your legs. Rest your scrotum on the pillow or towel. Place another towel between your skin and the plastic bag. Leave the ice on for 20 minutes, 2-3 times a day. Remove the ice if your skin turns bright red. This is very important. If you cannot feel pain, heat, or cold, you have a greater risk of damage to the area. Keep your scrotum elevated and supported while resting. Ask your health care provider if you should wear a scrotal support, such as a jockstrap. Wear it as told by your health care provider. Try taking a sitz bath to help with discomfort. This is a warm water bath that is taken while you are sitting down. The water should come up to your hips and should cover your buttocks. Do this 3-4 times per day or as told by your health care provider. General instructions Drink enough fluid to keep your urine pale yellow. Return to your normal activities as told by your health care provider. Ask your health care provider what activities are safe for you. Keep all follow-up visits. This is important. Contact a health care provider if: You have a fever. Your pain medicine is not helping. Your pain is getting worse. Your symptoms do not improve within 3 days. Summary Epididymitis is inflammation or swelling of the epididymis. This is caused by an infection. This condition can also cause pain and swelling of the testicle and scrotum. Treatment for this condition depends on the cause. If your condition is caused by a bacterial infection, oral antibiotic medicine may be prescribed. Inform your sexual partner or partners if you test positive for an STI. They may need to be treated. Do not engage in sexual activity with your partner or partners until their treatment is completed. Contact a health care provider if your symptoms do not improve within 3 days. This information is not intended to replace  advice given to you by your health care provider. Make sure you discuss any questions you have with your health care provider. Document Revised: 05/05/2021 Document Reviewed: 05/05/2021 Elsevier Patient Education  2024 [employer].

## 2023-04-06 LAB — CBC WITH DIFFERENTIAL/PLATELET
Basophils Absolute: 0.1 10*3/uL (ref 0.0–0.1)
Basophils Relative: 1.1 % (ref 0.0–3.0)
Eosinophils Absolute: 0.1 10*3/uL (ref 0.0–0.7)
Eosinophils Relative: 1.5 % (ref 0.0–5.0)
HCT: 44.3 % (ref 39.0–52.0)
Hemoglobin: 15 g/dL (ref 13.0–17.0)
Lymphocytes Relative: 25.2 % (ref 12.0–46.0)
Lymphs Abs: 1.6 10*3/uL (ref 0.7–4.0)
MCHC: 33.9 g/dL (ref 30.0–36.0)
MCV: 86.7 fl (ref 78.0–100.0)
Monocytes Absolute: 0.5 10*3/uL (ref 0.1–1.0)
Monocytes Relative: 8.3 % (ref 3.0–12.0)
Neutro Abs: 3.9 10*3/uL (ref 1.4–7.7)
Neutrophils Relative %: 63.9 % (ref 43.0–77.0)
Platelets: 251 10*3/uL (ref 150.0–400.0)
RBC: 5.12 Mil/uL (ref 4.22–5.81)
RDW: 13.6 % (ref 11.5–15.5)
WBC: 6.2 10*3/uL (ref 4.0–10.5)

## 2023-04-06 LAB — URINALYSIS, ROUTINE W REFLEX MICROSCOPIC
Bilirubin Urine: NEGATIVE
Hgb urine dipstick: NEGATIVE
Ketones, ur: NEGATIVE
Leukocytes,Ua: NEGATIVE
Nitrite: NEGATIVE
RBC / HPF: NONE SEEN (ref 0–?)
Specific Gravity, Urine: 1.01 (ref 1.000–1.030)
Total Protein, Urine: NEGATIVE
Urine Glucose: NEGATIVE
Urobilinogen, UA: 0.2 (ref 0.0–1.0)
pH: 7 (ref 5.0–8.0)

## 2023-04-06 LAB — SEDIMENTATION RATE: Sed Rate: 4 mm/hr (ref 0–20)

## 2023-04-06 LAB — C-REACTIVE PROTEIN: CRP: <1 mg/dL (ref 0.5–20.0)

## 2023-04-06 LAB — URINE CULTURE
MICRO NUMBER:: 15130423
SPECIMEN QUALITY:: ADEQUATE

## 2023-04-06 NOTE — Progress Notes (Signed)
See my chart note.

## 2023-04-07 LAB — URINE CYTOLOGY ANCILLARY ONLY
Chlamydia: NEGATIVE
Comment: NEGATIVE
Comment: NORMAL
Neisseria Gonorrhea: NEGATIVE

## 2023-04-10 ENCOUNTER — Encounter: Payer: Self-pay | Admitting: Family Medicine

## 2023-04-10 NOTE — Progress Notes (Signed)
See my chart note.

## 2023-04-14 NOTE — Telephone Encounter (Signed)
With Durene Cal and Mardelle Matte being out of office please schedule f/u ov with available provider.

## 2023-04-17 NOTE — Telephone Encounter (Signed)
Patient has been scheduled with Dr. Mardelle Matte on 7/15 @ 1:30pm. States he thinks it better to see someone who he has already seen about this before. He also wanted to thank [facility] for pushing message along.

## 2023-04-17 NOTE — Progress Notes (Unsigned)
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  Migraine prevention:  zonisamide 200mg  daily; Ajovy every 4 weeks.  2.  Migraine rescue:  Sumatriptan 100mg .  3.  He would still like to try tapering off of amitriptyline.  Decrease to 10mg  at bedtime for 2 weeks, then stop 4.  Limit use of pain relievers to no more than 2 days out of week to prevent risk of rebound or medication-overuse headache. 5.  Keep headache diary 6.  If workup for penile/inguinal pain leads to suspicion of nerve, further workup looking for compressive lesion on pudendal nerve warranted and would consider treating pain with gabapentin 7.  Otherwise, follow up in 6 months.  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   UPDATE: Added Ajovy.  No significant difference than Emgality.  Migraines were occurring 6-7 days a month but only 2 severe migraines since last visit in which he woke up with migraine (didn't respond to the sumatriptan injection)  No trigeminal neuralgia since last visit.  He was having burning pain ("like acid") on side of penis and then progressed to pain in the urethra, lower abdominal and inguinal region.  Tested negative for SDT and UTI.  Took levaquin for 10 days which did not help.  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  Notices when lying down.  No rash but now notices a bit of a rash which may be related to antibiotic and topical ointment treatment.  Some nonradiating low back pain which he thinks is likely musculoskeletal from exercise.  No bowel or bladder dysfunction.  He has chronic anorgasmia but has had an orgasm which aggravated pain.  No associated numbness.   Current NSAIDS:  none Current  analgesics:  Sumatriptan 100mg  Current triptans:  none Current ergotamine:  none Current anti-emetic: Promethazine 12.5 mg Current muscle relaxants: none Current anti-anxiolytic:  none Current sleep aide: melatonin  Current Antihypertensive medications: metoprolol succinate 25mg  QHS; losartan Current Antidepressant medications: amitriptyline 25mg  at bedtime (currently on it for insomnia but would like to again discontinue as it may be contributing to his anorgasmia) Current Anticonvulsant medications:  zonisamide 200mg  daily Current anti-CGRP:  Emgality (not taken since November) Current Vitamins/Herbal/Supplements: Vitamin D, melatonin Current Antihistamines/Decongestants: Benadryl, Zyrtec Other therapy:  none Other medications:  none   Caffeine:  1 mug of coffee daily  Diet:  Drinks water Exercise:  yes Depression:  yes; Anxiety: yes - started Pristiq in February and now improved. Other pain:  Muscle aches Sleep hygiene:  Well   HISTORY:  Onset:  Childhood and then since late 20s Location:  Unilateral (either side)  left behind eye radiating to back of head Quality:  pressure Initial Intensity:  7/10.  He denies new headache, thunderclap headache.  Sometimes wakes up with them. Aura:  On rare occasions had jagged lines in vision Premonitory Phase:  Mild numb sensation, flushing of ear on side of headache Postdrome:  A day he has a fatigue sensation Associated symptoms:  Photophobia, phonophobia, osmophobia, nausea.  He denies associated unilateral numbness or weakness. Initial Duration:  5-6 hours (if takes sumatriptan immediately, then 1 hour) Initial Frequency:  10 to 15 days Initial Frequency of abortive medication: at least 10 days a month Triggers:  Some strong smells,chocolate, red  wine, often after a stressful event, exposed to direct sunlight Relieving factors:  Sumatriptan Activity:  aggravates   In March 2020, he had a new migraine associated with aura.  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  He has had one subsequent event.     In October-November 2020, He had an episode of possible left-sided trigeminal neuralgia.  He described a spasm on side of his left eye radiating to the cheek.  It was an electric shock.  It lasted a second and occurred 3 times.  Eye exam was fine. Lower back pain - no numbness, burning    Past NSAIDS:  Ibuprofen, naproxen Past analgesics:  Tylenol Past abortive triptans: rizatriptan, eletriptan Past abortive ergotamine:  none Past muscle relaxants:  none Past anti-emetic:  none Past antihypertensive medications:  propranolol (caused fatigue and bradycardia) Past antidepressant medications:  doxepin Past anticonvulsant medications:  none Past anti-CGRP:  Nurtec (effective but comparable to sumatriptan in efficacy)  Past vitamins/Herbal/Supplements:  none Past antihistamines/decongestants:  none Other past therapies:  none   Family history of headache:  Father, niece   PAST MEDICAL HISTORY: Past Medical History:  Diagnosis Date   Allergic rhinitis    Allergy    seasonal   Arthritis    GERD (gastroesophageal reflux disease)    Gilbert syndrome    Heart murmur    tricuspid valve regurgitation   Hyperlipidemia    Hypertension    Internal hemorrhoids    Migraines    Palpitations    Panic attack    occasional   Syncope    on airplane in 12/2016/ due to stress   Ulcerative proctosigmoiditis (HCC) 2001    MEDICATIONS: Current Outpatient Medications on File Prior to Visit  Medication Sig Dispense Refill   balsalazide (COLAZAL) 750 MG capsule TAKE 3 CAPSULES 3 TIMES    DAILY 810 capsule 1   cetirizine (ZYRTEC) 10 MG tablet Take 10 mg by mouth daily.     chlorthalidone (HYGROTON) 25 MG tablet Take 1 tablet (25 mg total) by mouth daily. (Patient taking differently: Take 12.5 mg by mouth daily.) 90 tablet 3   diphenhydrAMINE (BENADRYL) 25 MG tablet  Take 25 mg by mouth at bedtime as needed.     fluticasone (FLONASE) 50 MCG/ACT nasal spray USE 1 SPRAY IN EACH NOSTRILTWICE DAILY 48 g 3   ibuprofen (ADVIL,MOTRIN) 200 MG tablet Take 200 mg by mouth as needed.       losartan (COZAAR) 50 MG tablet Take 1 tablet (50 mg total) by mouth daily. 90 tablet 3   Melatonin 5 MG CAPS Take 1 capsule by mouth at bedtime.     metoprolol succinate (TOPROL-XL) 25 MG 24 hr tablet TAKE 1 TABLET DAILY 90 tablet 3   sildenafil (VIAGRA) 100 MG tablet Take 1 tablet (100 mg total) by mouth daily as needed for erectile dysfunction. 90 day supply 30 tablet 3   Vitamin D, Cholecalciferol, 50 MCG (2000 UT) CAPS Take 1 capsule by mouth daily.     ALPRAZolam (XANAX) 0.25 MG tablet Take 1 tablet (0.25 mg total) by mouth 2 (two) times daily as needed for anxiety. (Patient not taking: Reported on 11/16/2022) 20 tablet 0   [DISCONTINUED] glycopyrrolate (ROBINUL) 2 MG tablet Take 2 mg by mouth 2 (two) times daily.       No current facility-administered medications on file prior to visit.     ALLERGIES: Allergies  Allergen Reactions   Shellfish Allergy  Nausea And Vomiting    scallops   Citrus     Oral pain, rash in mouth   Codeine Nausea Only   Salmon [Fish Allergy] Nausea Only and Rash    Especially smoked salmon    FAMILY HISTORY: Family History  Problem Relation Age of Onset   Arthritis Mother    Thyroid disease Father    Heart disease Father        a fib, expanded aorta   Heart disease Brother        marfanoid related- connective tissue related   Colon polyps Brother    Other Brother        #small vessel ischemic disease in brother- no evidence of stroke. Some memory changes. Thyroid/b12 etc workup.   Colon polyps Maternal Grandmother        [employer], [place] Uncle   Crohn's disease Paternal Aunt    Crohn's disease Cousin    Colon cancer Other        Grandmother's Family (x 1 brother to grandmother and 1 sister to grandmother)   Esophageal cancer Neg Hx     Stomach cancer Neg Hx    Rectal cancer Neg Hx       Objective:  Blood pressure 116/76, pulse 74, height 5\' 10"  (1.778 m), weight 190 lb (86.2 kg), SpO2 98 %. General: No acute distress.  Patient appears well-groomed.     Roy Millet, DO  CC: Tana Conch, MD

## 2023-04-18 ENCOUNTER — Encounter: Payer: Self-pay | Admitting: Neurology

## 2023-04-18 ENCOUNTER — Ambulatory Visit: Payer: BC Managed Care – PPO | Admitting: Neurology

## 2023-04-18 VITALS — BP 116/76 | HR 74 | Ht 70.0 in | Wt 190.0 lb

## 2023-04-18 DIAGNOSIS — G43009 Migraine without aura, not intractable, without status migrainosus: Secondary | ICD-10-CM

## 2023-04-18 DIAGNOSIS — N4889 Other specified disorders of penis: Secondary | ICD-10-CM | POA: Diagnosis not present

## 2023-04-24 ENCOUNTER — Ambulatory Visit: Payer: BC Managed Care – PPO | Admitting: Family Medicine

## 2023-04-25 ENCOUNTER — Encounter: Payer: Self-pay | Admitting: Family Medicine

## 2023-04-25 ENCOUNTER — Ambulatory Visit: Payer: BC Managed Care – PPO | Admitting: Family Medicine

## 2023-04-25 DIAGNOSIS — R300 Dysuria: Secondary | ICD-10-CM

## 2023-04-25 DIAGNOSIS — N5082 Scrotal pain: Secondary | ICD-10-CM

## 2023-04-25 DIAGNOSIS — N451 Epididymitis: Secondary | ICD-10-CM

## 2023-04-25 NOTE — Telephone Encounter (Signed)
Patient had OV with Dr. Mardelle Matte scheduled for 7/15 but ended up getting scheduled with PCP for 7/16 due to last minute cancellation. Due to PCP emergency OV was canceled and pt wants to know what to do next. States this has been dragging on for months and he only thinks it's useful to see his PCP. Please Advise.

## 2023-04-26 ENCOUNTER — Other Ambulatory Visit: Payer: Self-pay

## 2023-04-26 ENCOUNTER — Ambulatory Visit (INDEPENDENT_AMBULATORY_CARE_PROVIDER_SITE_OTHER): Payer: BC Managed Care – PPO

## 2023-04-26 DIAGNOSIS — N451 Epididymitis: Secondary | ICD-10-CM | POA: Diagnosis not present

## 2023-04-26 MED ORDER — LEVOFLOXACIN 500 MG PO TABS: 500.0000 mg | ORAL_TABLET | Freq: Every day | ORAL | 0 refills | Status: DC

## 2023-04-26 MED ORDER — CEFTRIAXONE SODIUM 500 MG IJ SOLR
500.0000 mg | Freq: Once | INTRAMUSCULAR | Status: AC
Start: 2023-04-26 — End: 2023-04-26
  Administered 2023-04-26: 500 mg via INTRAMUSCULAR

## 2023-04-26 NOTE — Telephone Encounter (Signed)
Patient requests to be called asap to discuss message/treatment

## 2023-04-27 NOTE — Progress Notes (Signed)
Pt tolerated injection well

## 2023-04-28 NOTE — Telephone Encounter (Signed)
See below

## 2023-04-30 ENCOUNTER — Other Ambulatory Visit: Payer: Self-pay | Admitting: Neurology

## 2023-05-01 ENCOUNTER — Other Ambulatory Visit: Payer: Self-pay

## 2023-05-01 MED ORDER — AJOVY 225 MG/1.5ML ~~LOC~~ SOAJ
225.0000 mg | SUBCUTANEOUS | 5 refills | Status: DC
  Filled 2023-05-01: qty 1.5, 28d supply, fill #0
  Filled 2023-05-31: qty 1.5, 28d supply, fill #1
  Filled 2023-06-28: qty 1.5, 28d supply, fill #2
  Filled 2023-07-27: qty 1.5, 28d supply, fill #3
  Filled 2023-08-17: qty 1.5, 28d supply, fill #4
  Filled 2023-10-21: qty 1.5, 28d supply, fill #5

## 2023-05-02 ENCOUNTER — Other Ambulatory Visit: Payer: Self-pay

## 2023-05-02 ENCOUNTER — Ambulatory Visit (HOSPITAL_COMMUNITY)
Admission: RE | Admit: 2023-05-02 | Discharge: 2023-05-02 | Disposition: A | Discharge status: Home / Self Care | Payer: BC Managed Care – PPO | Source: Ambulatory Visit | Attending: Family Medicine | Admitting: Family Medicine

## 2023-05-02 DIAGNOSIS — R300 Dysuria: Secondary | ICD-10-CM | POA: Insufficient documentation

## 2023-05-02 DIAGNOSIS — N5082 Scrotal pain: Secondary | ICD-10-CM | POA: Insufficient documentation

## 2023-05-02 DIAGNOSIS — N451 Epididymitis: Secondary | ICD-10-CM | POA: Insufficient documentation

## 2023-05-02 DIAGNOSIS — N4889 Other specified disorders of penis: Secondary | ICD-10-CM

## 2023-05-02 NOTE — Telephone Encounter (Signed)
[name] has communicated with pt and pt aware of scheduling.

## 2023-05-10 ENCOUNTER — Encounter (INDEPENDENT_AMBULATORY_CARE_PROVIDER_SITE_OTHER): Payer: Self-pay

## 2023-05-20 ENCOUNTER — Other Ambulatory Visit: Payer: Self-pay | Admitting: Neurology

## 2023-05-22 ENCOUNTER — Other Ambulatory Visit: Payer: Self-pay

## 2023-05-22 MED ORDER — MELOXICAM 7.5 MG PO TABS
7.5000 mg | ORAL_TABLET | Freq: Every day | ORAL | 0 refills | Status: DC
  Filled 2023-05-22: qty 7, 7d supply, fill #0

## 2023-05-23 ENCOUNTER — Other Ambulatory Visit: Payer: Self-pay

## 2023-05-24 ENCOUNTER — Encounter: Payer: Self-pay | Admitting: Neurology

## 2023-05-24 ENCOUNTER — Other Ambulatory Visit: Payer: Self-pay

## 2023-05-24 MED ORDER — ZONISAMIDE 100 MG PO CAPS
200.0000 mg | ORAL_CAPSULE | Freq: Every day | ORAL | 0 refills | Status: DC
  Filled 2023-05-24: qty 180, 90d supply, fill #0

## 2023-05-24 MED ORDER — MELOXICAM 15 MG PO TABS
15.0000 mg | ORAL_TABLET | Freq: Every day | ORAL | 1 refills | Status: DC
  Filled 2023-05-24: qty 30, 30d supply, fill #0
  Filled 2023-06-08: qty 30, 30d supply, fill #1

## 2023-05-25 ENCOUNTER — Other Ambulatory Visit: Payer: Self-pay

## 2023-05-25 ENCOUNTER — Encounter: Payer: Self-pay | Admitting: Neurology

## 2023-05-26 ENCOUNTER — Ambulatory Visit (INDEPENDENT_AMBULATORY_CARE_PROVIDER_SITE_OTHER): Payer: BC Managed Care – PPO | Admitting: Family Medicine

## 2023-05-26 ENCOUNTER — Encounter: Payer: Self-pay | Admitting: Family Medicine

## 2023-05-26 VITALS — BP 110/82 | HR 64 | Temp 97.2°F | Ht 70.0 in | Wt 187.2 lb

## 2023-05-26 DIAGNOSIS — Z125 Encounter for screening for malignant neoplasm of prostate: Secondary | ICD-10-CM

## 2023-05-26 DIAGNOSIS — Z131 Encounter for screening for diabetes mellitus: Secondary | ICD-10-CM | POA: Diagnosis not present

## 2023-05-26 DIAGNOSIS — Z0000 Encounter for general adult medical examination without abnormal findings: Secondary | ICD-10-CM | POA: Diagnosis not present

## 2023-05-26 DIAGNOSIS — R739 Hyperglycemia, unspecified: Secondary | ICD-10-CM | POA: Diagnosis not present

## 2023-05-26 DIAGNOSIS — E785 Hyperlipidemia, unspecified: Secondary | ICD-10-CM

## 2023-05-26 DIAGNOSIS — I10 Essential (primary) hypertension: Secondary | ICD-10-CM | POA: Diagnosis not present

## 2023-05-26 DIAGNOSIS — E559 Vitamin D deficiency, unspecified: Secondary | ICD-10-CM

## 2023-05-26 NOTE — Patient Instructions (Addendum)
Let us know when you get your flu or COVID vaccine.  Please stop by lab before you go If you have mychart- we will send your results within 3 business days of [place] receiving them.  If you do not have mychart- we will call you about results within 5 business days of [place] receiving them.  *please also note that you will see labs on mychart as soon as they post. I will later go in and write notes on them- will say "notes from Dr. Durene Cal"   Recommended follow up: Return in about 6 months (around 11/26/2023) for followup or sooner if needed.Schedule b4 you leave.

## 2023-05-26 NOTE — Addendum Note (Signed)
Addended by: Lorn Junes on: 05/26/2023 03:08 PM   Modules accepted: Orders

## 2023-05-26 NOTE — Progress Notes (Signed)
Phone: [phone]   Subjective:  Patient presents today for their annual physical. Chief complaint-noted.   See problem oriented charting- ROS- full  review of systems was completed and negative  except for: Ongoing pelvic pain, activity change with retirement and pain, fatigue, post nasal drip, tinnitus stable, eye itching, palpitations, abdominal pain, constipatoin, nausea, pain with urination, penile pain and testicular pian, back pain, seasonal allergies, dizziness, headaches, sad mood- some of this predated pain . Thinks NSAIDs could be causing some of gastroenterology issues  The following were reviewed and entered/updated in epic: Past Medical History:  Diagnosis Date   Allergic rhinitis    Allergy    seasonal   Anemia    One early in puberty   Arthritis    Depression    Not currently?   GERD (gastroesophageal reflux disease)    Sullivan Lone syndrome    Heart murmur    tricuspid valve regurgitation   Hyperlipidemia    Hypertension    Internal hemorrhoids    Migraines    Palpitations    Panic attack    occasional   Syncope    on airplane in 12/2016/ due to stress   Ulcer 96?   Duocdenal   Ulcerative proctosigmoiditis (HCC) 2001   Patient Active Problem List   Diagnosis Date Noted   Major depression, recurrent, full remission (HCC) 08/16/2018    Priority: High   ULCERATIVE COLITIS-LEFT SIDE.  Follows with Dr. Russella Dar of [facility] 05/06/2008    Priority: High   Insomnia 06/23/2022    Priority: Medium    Raynaud's disease 11/15/2019    Priority: Medium    Essential hypertension 03/12/2019    Priority: Medium    Gilbert syndrome 01/26/2018    Priority: Medium    Hyperglycemia 01/26/2018    Priority: Medium    History of adenomatous polyp of colon 05/22/2017    Priority: Medium    PSORIASIS, SCALP 05/03/2010    Priority: Medium    Hyperlipidemia 05/15/2007    Priority: Medium    Migraines 05/03/2007    Priority: Medium    Vasovagal syncope 02/14/2017     Priority: Low   Hearing loss 08/04/2016    Priority: Low   Low back pain 02/09/2016    Priority: Low   DOE (dyspnea on exertion) 04/15/2014    Priority: Low   LOC 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 LEG 10/12/2010    Priority: Low   Vitamin D deficiency 10/27/2009    Priority: Low   GERD 01/31/2008    Priority: Low   INTERNAL HEMORRHOIDS 01/23/2008    Priority: Low   Allergic rhinitis 05/03/2007    Priority: Low   Family history of alpha 1 antitrypsin deficiency 06/14/2018   Past Surgical History:  Procedure Laterality Date   ADENOIDECTOMY     as a child   COLONOSCOPY  2018   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 Some memory changes. Thyroid/b12 etc workup.   Hearing loss Brother    Colon polyps Maternal Grandmother        Earlie Raveling, Great Uncle   Crohn's disease Paternal Aunt    Crohn's disease Cousin    Colon cancer Other        Grandmother's Family (x 1 brother to grandmother and 1 sister to grandmother)   Esophageal cancer Neg Hx    Stomach cancer Neg Hx    Rectal cancer Neg Hx     Medications- reviewed and updated Current Outpatient Medications  Medication Sig Dispense Refill   balsalazide (COLAZAL) 750 MG capsule TAKE 3 CAPSULES 3 TIMES    DAILY (Patient taking differently: Take 750 mg by mouth in the morning and at bedtime. TAKE 3 CAPSULES 3 TIMES    DAILY) 810 capsule 1   cetirizine (ZYRTEC) 10 MG tablet Take 10 mg by mouth daily.     chlorthalidone (HYGROTON)  25 MG tablet TAKE 1 TABLET DAILY (Patient taking differently: Take 25 mg by mouth daily. 1/2 tab) 90 tablet 3   diphenhydrAMINE (BENADRYL) 25 MG tablet Take 25 mg by mouth at bedtime as needed.     fluticasone (FLONASE) 50 MCG/ACT nasal spray USE 1 SPRAY IN EACH NOSTRILTWICE DAILY 48 g 3   Fremanezumab-vfrm (AJOVY) 225 MG/1.5ML SOAJ Inject 225 mg into the skin every 28 (twenty-eight) days. 1.5 mL 5   ibuprofen (ADVIL,MOTRIN) 200 MG tablet Take 200 mg by mouth as needed.       losartan (COZAAR) 50 MG tablet Take 1 tablet (50 mg total) by mouth daily. 90 tablet 3   Melatonin 5 MG CAPS Take 1 capsule by mouth at bedtime.     meloxicam (MOBIC) 15 MG tablet Take 1 tablet (15 mg total) by mouth daily. 30 tablet 1   metoprolol succinate (TOPROL-XL) 25 MG 24 hr tablet TAKE 1 TABLET DAILY (Patient taking differently: Take 25 mg by mouth daily. 1/2 tab) 90 tablet 3   Vitamin D, Cholecalciferol, 50 MCG (2000 UT) CAPS Take 1 capsule by mouth daily.     zonisamide (ZONEGRAN) 100 MG capsule Take 2 capsules (200 mg total) by mouth daily. 180 capsule 0   mesalamine (CANASA) 1000 MG suppository Place 1 suppository (1,000 mg total) rectally at bedtime. (Patient not taking: Reported on 05/26/2023) 30 suppository 5   SUMAtriptan (IMITREX) 100 MG tablet TAKE 1 TABLET AS NEEDED FOR MIGRAINE. MAY REPEAT AFTER 2 HOURS. MAXIMUM 2 TABLETS IN 24 HOURS (Patient not taking: Reported on 05/26/2023) 9 tablet 5   triamcinolone ointment (KENALOG) 0.1 % Apply 1 application externally once a day 14 days 15 g 0   No current facility-administered medications for this visit.    Allergies-reviewed and updated Allergies  Allergen Reactions   Shellfish Allergy Nausea And Vomiting    scallops   Citrus     Oral pain, rash in mouth   Codeine Nausea Only   Salmon [Fish Allergy] Nausea Only and Rash    Especially smoked salmon    Social History   Social History Narrative   Lives with husband Chipper Oman also a patient of Dr.  Durene Cal). No pets or adopted children or natural children.       Retired in Educational psychologist for religious studies at [employer] . Still writing every morning. Also husband is.       Hobbies: walking, hiking, reading, music,  cooking , gardening.       Right handed.    Objective  Objective:  BP 110/82   Pulse 64   Temp (!) 97.2 F (36.2 C)   Ht 5\' 10"  (1.778 m)   Wt 187 lb 3.2 oz (84.9 kg)   SpO2 100%   BMI 26.86 kg/m  Gen: NAD, resting comfortably HEENT: Mucous membranes are moist. Oropharynx normal Neck: no thyromegaly CV: RRR no murmurs rubs or gallops Lungs: CTAB no crackles, wheeze, rhonchi Abdomen: soft/nontender/nondistended/normal bowel sounds. No rebound or guarding.  Ext: no edema Skin: warm, dry Neuro: grossly normal, moves all extremities, PERRLA Musculoskeletal: bunion noted   Assessment and Plan  62 y.o. male presenting for annual physical.  Health Maintenance counseling: 1. Anticipatory guidance: Patient counseled regarding regular dental exams -q6 months, eye exams -yearly,  avoiding smoking and second hand smoke , limiting alcohol to 2 beverages per day - maybe 10 a week or less, no illicit drugs - sparing marijuana maybe every 2-3 months.   2. Risk factor reduction:  Advised patient of need for regular exercise and diet rich and fruits and vegetables to reduce risk of heart attack and stroke.  Exercise- still able to go to gym with trainver 3x a week and walking regularly- reduced certain moves like squats.  Diet/weight management-Down 6 pounds from last physical.  Wt Readings from Last 3 Encounters:  05/26/23 187 lb 3.2 oz (84.9 kg)  04/18/23 190 lb (86.2 kg)  04/05/23 187 lb 3.2 oz (84.9 kg)  3. Immunizations/screenings/ancillary studies-consider fall flu and COVID shot  Immunization History  Administered Date(s) Administered   Hepatitis A, Adult 05/24/2022   Influenza Whole 07/31/2009, 06/16/2010   Influenza,inj,Quad PF,6+ Mos 06/30/2015, 05/29/2019, 06/24/2022    Influenza-Unspecified 07/21/2016, 07/10/2017, 07/17/2018, 05/29/2019, 07/15/2020, 07/29/2021   Moderna SARS-COV2 Booster Vaccination 07/10/2022   Moderna Sars-Covid-2 Vaccination 01/13/2021   PFIZER(Purple Top)SARS-COV-2 Vaccination 12/14/2019, 01/04/2020, 07/15/2020   Pfizer Covid-19 Vaccine Bivalent Booster 29yrs & up 07/29/2021   Td 05/09/2006   Tdap 11/01/2013   Zoster Recombinant(Shingrix) 10/15/2018, 03/12/2019   4. Prostate cancer screening- low risk prior PSA trend-update with labs today Lab Results  Component Value Date   PSA 0.40 05/17/2022   PSA 0.49 03/29/2021   PSA 0.30 02/12/2020   5. Colon cancer screening - history of adenomatous polyps of colon 05/06/2020 + has ulcerative colitis-3-year repeat plannedand already scheduled for visit in October for this.  6. Skin cancer screening-sees dermatology yearly.  advised regular sunscreen use. Denies worrisome, changing, or new skin lesions.  7. Smoking associated screening (lung cancer screening, AAA screen 65-75, UA)-never smoker  8. STD screening - monogamous so declines   Status of chronic or acute concerns   # Social update-he is retired! Upcoming [place] trip   # Ongoing pelvic pain despite treatment for epididymitis x 2-symptoms started in June of this year.  STD testing has been unremarkable.-Was diagnosed as chronic orchialgia started on meloxicam 15mg  (1 month of treatment and if doesn't improve would get spermatic cord block and would be diagnostic and potentially have denervation) . Also considering scan with Dr. Everlena Cooper). Meloxicam not as effective as 4 of the 220 naproxen per day.  -Labs were also checked for anorgasmia by Dr. Lafonda Mosses- has to go back for 2nd test for borderline testosterone. Mild improvement off amitriptyline  #ulcerative colitis- follow up with Dr. Russella Dar on balsalazide and mesalamine  # Linna Hoff through Dr. Everlena Cooper with Imitrex as backup as well as zonisamide- has been  helpful  #  hypertension S: medication: Chlorthalidone 12.5 mg daily, losartan 50 mg, metoprolol 12.5 mg XR- also helps with palpitations  -some high readings with pain and or NSAIDs recently BP Readings from Last 3 Encounters:  05/26/23 110/82  04/18/23 116/76  04/05/23 (!) 150/100  A/P: stable- continue current medicines   #hyperlipidemia-CT calcium score of 0 on 07/02/2022 S: Medication:none  Lab Results  Component Value Date   CHOL 245 (H) 05/17/2022   HDL 60.20 05/17/2022   LDLCALC 157 (H) 05/17/2022   LDLDIRECT 148.0 10/15/2018   TRIG 139.0 05/17/2022   CHOLHDL 4 05/17/2022   A/P: despite lipid elevations reassuring CT calcium- liekly hold off on statin and recheck q5 years    # Hyperglycemia/insulin resistance/prediabetes-A1c's have been normal blood sugar slightly high at times in the past   # Depression history -essentially only on amitriptyline 25 mg at last visit and we reduced to 10 mg at bedtime- now off as of 1.5 weeks ago- having some adjustment period with that plus life changes with retirement. Had been on Lexapro in past- worsens anorgasmia. Saw therapist this morning- does not think he is depressed- wants to monitor. Phq9 at 4 and generalized anxiety disorder 7 of 4.   #Vitamin D deficiency S: Medication: 2000 units Last vitamin D Lab Results  Component Value Date   VD25OH 40.06 05/17/2022   A/P: hopefully stable- update vitamin D today. Continue current meds for now    #Tinnitus- ongoing chronic  Recommended follow up: No follow-ups on file. Future Appointments  Date Time Provider Department Center  07/04/2023  2:00 PM [facility] PREVISIT RM 51 [facility]  08/02/2023  8:30 AM Meryl Dare, MD [facility]  10/30/2023  9:50 AM Drema Dallas, DO [facility] None   Lab/Order associations: fasting   ICD-10-CM   1. Preventative health care  Z00.00     2. Essential hypertension  I10     3. Hyperglycemia  R73.9     4. Screening for diabetes  mellitus  Z13.1     5. Hyperlipidemia, unspecified hyperlipidemia type  E78.5     6. Vitamin D deficiency  E55.9     7. Screening for prostate cancer  Z12.5       No orders of the defined types were placed in this encounter.   Return precautions advised.  Tana Conch, MD

## 2023-05-27 ENCOUNTER — Encounter: Payer: Self-pay | Admitting: Family Medicine

## 2023-05-29 ENCOUNTER — Other Ambulatory Visit: Payer: Self-pay

## 2023-05-29 DIAGNOSIS — I10 Essential (primary) hypertension: Secondary | ICD-10-CM

## 2023-05-29 DIAGNOSIS — E785 Hyperlipidemia, unspecified: Secondary | ICD-10-CM

## 2023-05-29 DIAGNOSIS — R739 Hyperglycemia, unspecified: Secondary | ICD-10-CM

## 2023-05-29 DIAGNOSIS — E559 Vitamin D deficiency, unspecified: Secondary | ICD-10-CM

## 2023-05-29 DIAGNOSIS — F5232 Male orgasmic disorder: Secondary | ICD-10-CM

## 2023-05-29 DIAGNOSIS — Z125 Encounter for screening for malignant neoplasm of prostate: Secondary | ICD-10-CM

## 2023-05-30 ENCOUNTER — Telehealth: Payer: Self-pay | Admitting: Neurology

## 2023-05-30 DIAGNOSIS — G588 Other specified mononeuropathies: Secondary | ICD-10-CM

## 2023-05-30 NOTE — Telephone Encounter (Signed)
Patient is requesting a call back.

## 2023-05-31 ENCOUNTER — Other Ambulatory Visit: Payer: BC Managed Care – PPO

## 2023-05-31 ENCOUNTER — Other Ambulatory Visit (INDEPENDENT_AMBULATORY_CARE_PROVIDER_SITE_OTHER): Payer: BC Managed Care – PPO

## 2023-05-31 DIAGNOSIS — E559 Vitamin D deficiency, unspecified: Secondary | ICD-10-CM | POA: Diagnosis not present

## 2023-05-31 DIAGNOSIS — Z125 Encounter for screening for malignant neoplasm of prostate: Secondary | ICD-10-CM

## 2023-05-31 DIAGNOSIS — I10 Essential (primary) hypertension: Secondary | ICD-10-CM

## 2023-05-31 DIAGNOSIS — E785 Hyperlipidemia, unspecified: Secondary | ICD-10-CM | POA: Diagnosis not present

## 2023-05-31 DIAGNOSIS — F5232 Male orgasmic disorder: Secondary | ICD-10-CM

## 2023-05-31 LAB — CBC WITH DIFFERENTIAL/PLATELET
Basophils Absolute: 0 10*3/uL (ref 0.0–0.1)
Basophils Relative: 1 % (ref 0.0–3.0)
Eosinophils Absolute: 0.2 10*3/uL (ref 0.0–0.7)
Eosinophils Relative: 4.2 % (ref 0.0–5.0)
HCT: 41.7 % (ref 39.0–52.0)
Hemoglobin: 14.2 g/dL (ref 13.0–17.0)
Lymphocytes Relative: 27 % (ref 12.0–46.0)
Lymphs Abs: 1.1 10*3/uL (ref 0.7–4.0)
MCHC: 34.1 g/dL (ref 30.0–36.0)
MCV: 88.4 fl (ref 78.0–100.0)
Monocytes Absolute: 0.4 10*3/uL (ref 0.1–1.0)
Monocytes Relative: 9.9 % (ref 3.0–12.0)
Neutro Abs: 2.4 10*3/uL (ref 1.4–7.7)
Neutrophils Relative %: 57.9 % (ref 43.0–77.0)
Platelets: 201 10*3/uL (ref 150.0–400.0)
RBC: 4.72 Mil/uL (ref 4.22–5.81)
RDW: 13.6 % (ref 11.5–15.5)
WBC: 4.2 10*3/uL (ref 4.0–10.5)

## 2023-05-31 LAB — LIPID PANEL
Cholesterol: 244 mg/dL — ABNORMAL HIGH (ref 0–200)
HDL: 60.2 mg/dL (ref >39.00)
LDL Cholesterol: 165 mg/dL — ABNORMAL HIGH (ref 0–99)
NonHDL: 183.4
Total CHOL/HDL Ratio: 4
Triglycerides: 92 mg/dL (ref 0.0–149.0)
VLDL: 18.4 mg/dL (ref 0.0–40.0)

## 2023-05-31 LAB — COMPREHENSIVE METABOLIC PANEL
ALT: 9 U/L (ref 0–53)
AST: 16 U/L (ref 0–37)
Albumin: 4.6 g/dL (ref 3.5–5.2)
Alkaline Phosphatase: 32 U/L — ABNORMAL LOW (ref 39–117)
BUN: 19 mg/dL (ref 6–23)
CO2: 27 meq/L (ref 19–32)
Calcium: 9.4 mg/dL (ref 8.4–10.5)
Chloride: 103 meq/L (ref 96–112)
Creatinine, Ser: 1.27 mg/dL (ref 0.40–1.50)
GFR: 61.36 mL/min (ref >60.00)
Glucose, Bld: 91 mg/dL (ref 70–99)
Potassium: 3.6 meq/L (ref 3.5–5.1)
Sodium: 139 meq/L (ref 135–145)
Total Bilirubin: 1.2 mg/dL (ref 0.2–1.2)
Total Protein: 6.7 g/dL (ref 6.0–8.3)

## 2023-05-31 NOTE — Telephone Encounter (Signed)
Per Dr.Jaffe, Check MRI of pelvis with and without contrast with attention to pudendal nerve (ask Mr. Munn if the pain is right sided, left sided or both) to evaluate for pelvic mass.     Patient aware Mri ordered and sent to [facility].

## 2023-06-02 ENCOUNTER — Other Ambulatory Visit: Payer: Self-pay

## 2023-06-02 LAB — VITAMIN D 25 HYDROXY (VIT D DEFICIENCY, FRACTURES): VITD: 57 ng/mL (ref 30.00–100.00)

## 2023-06-02 LAB — PSA: PSA: 0.38 ng/mL (ref 0.10–4.00)

## 2023-06-02 LAB — TESTOSTERONE: Testosterone: 289.25 ng/dL — ABNORMAL LOW (ref 300.00–890.00)

## 2023-06-08 ENCOUNTER — Other Ambulatory Visit: Payer: Self-pay

## 2023-06-09 ENCOUNTER — Other Ambulatory Visit: Payer: Self-pay

## 2023-06-28 ENCOUNTER — Other Ambulatory Visit: Payer: Self-pay | Admitting: Neurology

## 2023-06-28 ENCOUNTER — Other Ambulatory Visit: Payer: Self-pay

## 2023-06-30 ENCOUNTER — Encounter: Payer: Self-pay | Admitting: Neurology

## 2023-06-30 ENCOUNTER — Encounter: Payer: Self-pay | Admitting: Family Medicine

## 2023-06-30 ENCOUNTER — Other Ambulatory Visit: Payer: Self-pay

## 2023-06-30 MED ORDER — FLUTICASONE PROPIONATE 50 MCG/ACT NA SUSP: 2.0000 | Freq: Every day | NASAL | 3 refills | Status: DC

## 2023-07-03 ENCOUNTER — Ambulatory Visit
Admission: RE | Admit: 2023-07-03 | Discharge: 2023-07-03 | Disposition: A | Discharge status: Home / Self Care | Payer: BC Managed Care – PPO | Source: Ambulatory Visit | Attending: Neurology | Admitting: Neurology

## 2023-07-03 ENCOUNTER — Other Ambulatory Visit: Payer: Self-pay | Admitting: Neurology

## 2023-07-03 DIAGNOSIS — G588 Other specified mononeuropathies: Secondary | ICD-10-CM

## 2023-07-03 MED ORDER — GADOPICLENOL 0.5 MMOL/ML IV SOLN
8.0000 mL | Freq: Once | INTRAVENOUS | Status: AC | PRN
  Administered 2023-07-03: 8 mL via INTRAVENOUS

## 2023-07-04 ENCOUNTER — Other Ambulatory Visit (HOSPITAL_BASED_OUTPATIENT_CLINIC_OR_DEPARTMENT_OTHER): Payer: Self-pay

## 2023-07-04 ENCOUNTER — Encounter: Payer: Self-pay | Admitting: Gastroenterology

## 2023-07-04 ENCOUNTER — Ambulatory Visit (AMBULATORY_SURGERY_CENTER): Payer: BC Managed Care – PPO | Admitting: *Deleted

## 2023-07-04 ENCOUNTER — Other Ambulatory Visit: Payer: Self-pay

## 2023-07-04 VITALS — Ht 70.0 in | Wt 182.0 lb

## 2023-07-04 DIAGNOSIS — Z83719 Family history of colon polyps, unspecified: Secondary | ICD-10-CM

## 2023-07-04 DIAGNOSIS — Z8719 Personal history of other diseases of the digestive system: Secondary | ICD-10-CM

## 2023-07-04 DIAGNOSIS — Z86010 Personal history of colonic polyps: Secondary | ICD-10-CM

## 2023-07-04 MED ORDER — SUMATRIPTAN SUCCINATE 100 MG PO TABS
ORAL_TABLET | ORAL | 5 refills | Status: DC
  Filled 2023-07-04: qty 9, 30d supply, fill #0
  Filled 2023-07-27: qty 9, 30d supply, fill #1
  Filled 2023-08-17: qty 9, 15d supply, fill #2
  Filled 2023-08-28: qty 9, 15d supply, fill #3
  Filled 2023-10-21: qty 9, 15d supply, fill #4

## 2023-07-04 MED ORDER — SUTAB 1479-225-188 MG PO TABS
ORAL_TABLET | ORAL | 0 refills | Status: DC
Start: 2023-07-04 — End: 2023-08-02
  Filled 2023-07-04: qty 24, 1d supply, fill #0

## 2023-07-04 NOTE — Progress Notes (Signed)
No egg or soy allergy known to patient  No issues known to pt with past sedation with any surgeries or procedures Patient denies ever being told they had issues or difficulty with intubation  No FH of Malignant Hyperthermia Pt is not on diet pills Pt is not on  home 02  Pt is not on blood thinners  Pt  issues with constipation ,2 day prep given. No A fib or A flutter Have any cardiac testing pending--no Pt instructed to use Singlecare.com or GoodRx for a price reduction on prep    Patient's chart reviewed by Cathlyn Parsons CNRA prior to previsit and patient appropriate for the [facility].  Previsit completed and red dot placed by patient's name on their procedure day (on provider's schedule).     Pt.wanted tablets.

## 2023-07-05 ENCOUNTER — Other Ambulatory Visit: Payer: Self-pay

## 2023-07-06 ENCOUNTER — Other Ambulatory Visit: Payer: Self-pay

## 2023-07-06 MED ORDER — TESTOSTERONE 20.25 MG/ACT (1.62%) TD GEL
TRANSDERMAL | 3 refills | Status: DC
  Filled 2023-07-06: qty 75, 30d supply, fill #0

## 2023-07-06 MED ORDER — MELOXICAM 15 MG PO TABS
15.0000 mg | ORAL_TABLET | Freq: Every day | ORAL | 1 refills | Status: DC
  Filled 2023-07-06: qty 30, 30d supply, fill #0

## 2023-07-07 ENCOUNTER — Other Ambulatory Visit: Payer: Self-pay

## 2023-07-10 ENCOUNTER — Other Ambulatory Visit: Payer: Self-pay | Admitting: Family Medicine

## 2023-07-11 ENCOUNTER — Other Ambulatory Visit: Payer: Self-pay

## 2023-07-17 ENCOUNTER — Encounter: Payer: Self-pay | Admitting: Neurology

## 2023-07-19 ENCOUNTER — Other Ambulatory Visit: Payer: Self-pay

## 2023-07-24 ENCOUNTER — Encounter: Payer: Self-pay | Admitting: Neurology

## 2023-07-26 ENCOUNTER — Other Ambulatory Visit: Payer: Self-pay

## 2023-07-26 MED ORDER — GABAPENTIN 100 MG PO CAPS
100.0000 mg | ORAL_CAPSULE | Freq: Three times a day (TID) | ORAL | 1 refills | Status: DC | PRN
  Filled 2023-07-26: qty 90, 30d supply, fill #0

## 2023-07-26 NOTE — Progress Notes (Unsigned)
Called pt and he would like to takgabapentin 100mg  as needed.Per Dr. Everlena Cooper okay.

## 2023-07-28 ENCOUNTER — Other Ambulatory Visit: Payer: Self-pay

## 2023-08-02 ENCOUNTER — Encounter: Payer: Self-pay | Admitting: Gastroenterology

## 2023-08-02 ENCOUNTER — Other Ambulatory Visit: Payer: Self-pay

## 2023-08-02 ENCOUNTER — Ambulatory Visit: Payer: BC Managed Care – PPO | Admitting: Gastroenterology

## 2023-08-02 VITALS — BP 107/44 | HR 61 | Temp 97.3°F | Resp 12 | Ht 70.0 in | Wt 182.0 lb

## 2023-08-02 DIAGNOSIS — Z860101 Personal history of adenomatous and serrated colon polyps: Secondary | ICD-10-CM

## 2023-08-02 DIAGNOSIS — Z09 Encounter for follow-up examination after completed treatment for conditions other than malignant neoplasm: Secondary | ICD-10-CM | POA: Diagnosis present

## 2023-08-02 DIAGNOSIS — K5130 Ulcerative (chronic) rectosigmoiditis without complications: Secondary | ICD-10-CM | POA: Diagnosis not present

## 2023-08-02 DIAGNOSIS — Z860100 Personal history of colon polyps, unspecified: Secondary | ICD-10-CM

## 2023-08-02 MED ORDER — SODIUM CHLORIDE 0.9 % IV SOLN: 500.0000 mL | Freq: Once | INTRAVENOUS | Status: DC

## 2023-08-02 NOTE — Patient Instructions (Signed)
**  Per Dr. Russella Dar, need 1 year F/U with Dr. Doy Hutching**   YOU HAD AN ENDOSCOPIC PROCEDURE TODAY AT THE [facility]:   Refer to the procedure report that was given to you for any specific questions about what was found during the examination.  If the procedure report does not answer your questions, please call your gastroenterologist to clarify.  If you requested that your care partner not be given the details of your procedure findings, then the procedure report has been included in a sealed envelope for you to review at your convenience later.  YOU SHOULD EXPECT: Some feelings of bloating in the abdomen. Passage of more gas than usual.  Walking can help get rid of the air that was put into your GI tract during the procedure and reduce the bloating. If you had a lower endoscopy (such as a colonoscopy or flexible sigmoidoscopy) you may notice spotting of blood in your stool or on the toilet paper. If you underwent a bowel prep for your procedure, you may not have a normal bowel movement for a few days.  Please Note:  You might notice some irritation and congestion in your nose or some drainage.  This is from the oxygen used during your procedure.  There is no need for concern and it should clear up in a day or so.  SYMPTOMS TO REPORT IMMEDIATELY:  Following lower endoscopy (colonoscopy or flexible sigmoidoscopy):  Excessive amounts of blood in the stool  Significant tenderness or worsening of abdominal pains  Swelling of the abdomen that is new, acute  Fever of 100F or higher   For urgent or emergent issues, a gastroenterologist can be reached at any hour by calling [phone]. Do not use MyChart messaging for urgent concerns.    DIET:  We do recommend a small meal at first, but then you may proceed to your regular diet.  Drink plenty of fluids but you should avoid alcoholic beverages for 24 hours.  ACTIVITY:  You should plan to take it easy for the rest of today and you should  NOT DRIVE or use heavy machinery until tomorrow (because of the sedation medicines used during the test).    FOLLOW UP: Our staff will call the number listed on your records the next business day following your procedure.  We will call around 7:15- 8:00 am to check on you and address any questions or concerns that you may have regarding the information given to you following your procedure. If we do not reach you, we will leave a message.     If any biopsies were taken you will be contacted by phone or by letter within the next 1-3 weeks.  Please call us at [phone] if you have not heard about the biopsies in 3 weeks.    SIGNATURES/CONFIDENTIALITY: You and/or your care partner have signed paperwork which will be entered into your electronic medical record.  These signatures attest to the fact that that the information above on your After Visit Summary has been reviewed and is understood.  Full responsibility of the confidentiality of this discharge information lies with you and/or your care-partner.

## 2023-08-02 NOTE — Progress Notes (Signed)
Report to PACU, RN, vss, BBS= Clear.  

## 2023-08-02 NOTE — Progress Notes (Signed)
Pt's states no medical or surgical changes since previsit or office visit. 

## 2023-08-02 NOTE — Op Note (Signed)
[facility] Patient Name: [name] Procedure Date: 08/02/2023 8:26 AM MRN: [id number] Endoscopist: Meryl Dare , MD, [phone] Age: 62 Referring MD:  Date of Birth: [birth year] Gender: Male Account #: [id number] Procedure:                Colonoscopy Indications:              High risk colon cancer surveillance: Ulcerative                            left sided colitis of 8 (or more) years duration,                            Personal history of adenomatous and sessile                            serrated colon polyps Medicines:                Monitored Anesthesia Care Procedure:                Pre-Anesthesia Assessment:                           - Prior to the procedure, a History and Physical                            was performed, and patient medications and                            allergies were reviewed. The patient's tolerance of                            previous anesthesia was also reviewed. The risks                            and benefits of the procedure and the sedation                            options and risks were discussed with the patient.                            All questions were answered, and informed consent                            was obtained. Prior Anticoagulants: The patient has                            taken no anticoagulant or antiplatelet agents. ASA                            Grade Assessment: II - A patient with mild systemic                            disease. After reviewing the risks and benefits,  the patient was deemed in satisfactory condition to                            undergo the procedure.                           After obtaining informed consent, the colonoscope                            was passed under direct vision. Throughout the                            procedure, the patient's blood pressure, pulse, and                            oxygen saturations were monitored  continuously. The                            CF [id number] #[id number] was introduced through the anus                            and advanced to the the cecum, identified by                            appendiceal orifice and ileocecal valve. The                            ileocecal valve, appendiceal orifice, and rectum                            were photographed. The quality of the bowel                            preparation was good. The colonoscopy was somewhat                            difficult due to significant looping and a tortuous                            colon. The patient tolerated the procedure well. Scope In: 8:39:15 AM Scope Out: 8:54:52 AM Scope Withdrawal Time: 0 hours 10 minutes 1 second  Total Procedure Duration: 0 hours 15 minutes 37 seconds  Findings:                 The perianal and digital rectal examinations were                            normal.                           The entire examined colon appeared normal on direct                            and retroflexion views. Biopsies were taken with a  cold forceps for histology. Complications:            No immediate complications. Estimated blood loss:                            None. Estimated Blood Loss:     Estimated blood loss: none. Impression:               - The entire examined colon is normal on direct and                            retroflexion views. Recommendation:           - Repeat colonoscopy after studies are complete for                            surveillance based on pathology results.                           - Patient has a contact number available for                            emergencies. The signs and symptoms of potential                            delayed complications were discussed with the                            patient. Return to normal activities tomorrow.                            Written discharge instructions were provided to the                             patient.                           - Resume previous diet.                           - Continue present medications.                           - Await pathology results.                           - GI office appt with Dr. Doy Hutching in 1 year. Meryl Dare, MD 08/02/2023 8:59:04 AM This report has been signed electronically.

## 2023-08-02 NOTE — Progress Notes (Signed)
History & Physical  Primary Care Physician:  Shelva Majestic, MD Primary Gastroenterologist: Claudette Head, MD  Impression / Plan:  Personal history of adenomatous and sessile serrated colon polyps, history of proctosigmoiditis for colonoscopy.  CHIEF COMPLAINT: Ulcerative proctosigmoiditis, personal history of colon polyps   HPI: [name] is a 62 y.o. male with a personal history of adenomatous and sessile serrated colon polyps with a history of proctosigmoiditis for colonoscopy.    Past Medical History:  Diagnosis Date   Allergic rhinitis    Allergy    seasonal   Anemia    One early in puberty   Arthritis    Depression    Not currently?   GERD (gastroesophageal reflux disease)    Sullivan Lone syndrome    Heart murmur    tricuspid valve regurgitation   Hyperlipidemia    Hypertension    Internal hemorrhoids    Migraines    Palpitations    Panic attack    occasional   Syncope    on airplane in 12/2016/ due to stress   Ulcer 96?   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 Authorizing Provider  balsalazide (COLAZAL) 750 MG capsule TAKE 3 CAPSULES 3 TIMES    DAILY Patient taking differently: Take 750 mg by mouth in the morning and at bedtime. TAKE 3 CAPSULES 2 TIMES    DAILY 02/13/23  Yes Shelva Majestic, MD  cetirizine (ZYRTEC) 10 MG tablet Take 10 mg by mouth daily.   Yes [provider]  chlorthalidone (HYGROTON) 25 MG tablet TAKE 1 TABLET DAILY Patient taking differently: Take 25 mg by mouth daily. 1/2 tab 02/13/23  Yes Shelva Majestic, MD  diphenhydrAMINE (BENADRYL) 25 MG tablet Take 25 mg by mouth at bedtime as needed.    Yes [provider]  fluticasone (FLONASE) 50 MCG/ACT nasal spray Place 2 sprays into both nostrils daily. 06/30/23  Yes Shelva Majestic, MD  losartan (COZAAR) 50 MG tablet Take 1 tablet (50 mg total) by mouth daily. 02/02/23  Yes Shelva Majestic, MD  meloxicam (MOBIC) 15 MG tablet Take 1 tablet (15 mg total) by mouth daily. 07/06/23  Yes   metoprolol succinate (TOPROL-XL) 25 MG 24 hr tablet TAKE 1 TABLET DAILY 07/10/23  Yes Shelva Majestic, MD  SUMAtriptan (IMITREX) 100 MG tablet TAKE 1 TABLET AS NEEDED FOR MIGRAINE. MAY REPEAT AFTER 2 HOURS. MAXIMUM 2 TABLETS IN 24 HOURS 07/04/23  Yes Shelva Majestic, MD  Testosterone (ANDROGEL PUMP) 20.25 MG/ACT (1.62%) GEL 2 pumps Topical every morning apply to clean/intact skin on shoulder, upper arms, or stomach 07/06/23  Yes   Vitamin D, Cholecalciferol, 50 MCG (2000 UT) CAPS Take 1 capsule by mouth daily.   Yes [provider]  zonisamide (ZONEGRAN) 100 MG capsule Take 2 capsules (200 mg total) by mouth daily. 05/24/23  Yes Jaffe, Adam R, DO  Fremanezumab-vfrm (AJOVY) 225 MG/1.5ML SOAJ Inject 225 mg into the skin every 28 (twenty-eight) days. 05/01/23   Drema Dallas, DO  gabapentin (NEURONTIN) 100 MG capsule Take 1 capsule (100 mg total)  by mouth 3 (three) times daily as needed. Patient not taking: Reported on 08/02/2023 07/26/23   Drema Dallas, DO  ibuprofen (ADVIL,MOTRIN) 200 MG tablet Take 200 mg by mouth as needed.   Patient not taking: Reported on 07/04/2023    [provider]  mesalamine (CANASA) 1000 MG suppository Place 1 suppository (1,000 mg total) rectally at bedtime. Patient not taking: Reported on 05/26/2023 12/09/22   Shelva Majestic, MD  glycopyrrolate (ROBINUL) 2 MG tablet Take 2 mg by mouth 2 (two) times daily.    05/20/11  [provider]    Current Outpatient Medications  Medication Sig Dispense Refill   balsalazide (COLAZAL) 750 MG capsule TAKE 3 CAPSULES 3 TIMES    DAILY (Patient taking differently:  Take 750 mg by mouth in the morning and at bedtime. TAKE 3 CAPSULES 2 TIMES    DAILY) 810 capsule 1   cetirizine (ZYRTEC) 10 MG tablet Take 10 mg by mouth daily.     chlorthalidone (HYGROTON) 25 MG tablet TAKE 1 TABLET DAILY (Patient taking differently: Take 25 mg by mouth daily. 1/2 tab) 90 tablet 3   diphenhydrAMINE (BENADRYL) 25 MG tablet Take 25 mg by mouth at bedtime as needed.     fluticasone (FLONASE) 50 MCG/ACT nasal spray Place 2 sprays into both nostrils daily. 48 g 3   losartan (COZAAR) 50 MG tablet Take 1 tablet (50 mg total) by mouth daily. 90 tablet 3   meloxicam (MOBIC) 15 MG tablet Take 1 tablet (15 mg total) by mouth daily. 30 tablet 1   metoprolol succinate (TOPROL-XL) 25 MG 24 hr tablet TAKE 1 TABLET DAILY 90 tablet 3   SUMAtriptan (IMITREX) 100 MG tablet TAKE 1 TABLET AS NEEDED FOR MIGRAINE. MAY REPEAT AFTER 2 HOURS. MAXIMUM 2 TABLETS IN 24 HOURS 9 tablet 5   Testosterone (ANDROGEL PUMP) 20.25 MG/ACT (1.62%) GEL 2 pumps Topical every morning apply to clean/intact skin on shoulder, upper arms, or stomach 75 g 3   Vitamin D, Cholecalciferol, 50 MCG (2000 UT) CAPS Take 1 capsule by mouth daily.     zonisamide (ZONEGRAN) 100 MG capsule Take 2 capsules (200 mg total) by mouth daily. 180 capsule 0   Fremanezumab-vfrm (AJOVY) 225 MG/1.5ML SOAJ Inject 225 mg into the skin every 28 (twenty-eight) days. 1.5 mL 5   gabapentin (NEURONTIN) 100 MG capsule Take 1 capsule (100 mg total) by mouth 3 (three) times daily as needed. (Patient not taking: Reported on 08/02/2023) 90 capsule 1   ibuprofen (ADVIL,MOTRIN) 200 MG tablet Take 200 mg by mouth as needed.   (Patient not taking: Reported on 07/04/2023)     mesalamine (CANASA) 1000 MG suppository Place 1 suppository (1,000 mg total) rectally at bedtime. (Patient not taking: Reported on 05/26/2023) 30 suppository 5   Current Facility-Administered Medications  Medication Dose Route Frequency Provider Last Rate Last Admin   0.9 %  sodium chloride  infusion  500 mL Intravenous Once Meryl Dare, MD        Allergies as of 08/02/2023 - Review Complete 08/02/2023  Allergen Reaction Noted   Shellfish allergy Nausea And Vomiting 04/15/2014   Citrus  04/15/2014   Codeine Nausea Only 01/24/2011   Rosanne Ashing allergy] Nausea Only and Rash 04/15/2014    Family History  Problem Relation Age of Onset   Arthritis Mother    Cancer Mother    Thyroid disease Father    Heart disease Father        a fib, expanded aorta  Hearing loss Father    Heart disease Brother        marfanoid related- connective tissue related   Varicose Veins Brother    Aortic aneurysm Brother        tear requiring intensive care in 2024. descending thoracic   GER disease Brother    Colon polyps Brother    Other Brother        #small vessel ischemic disease in brother- no evidence of stroke. Some memory changes. Thyroid/b12 etc workup.   Hearing loss Brother    Crohn's disease Paternal Aunt    Colon polyps Maternal Grandmother        Earlie Raveling, [place] Uncle   Crohn's disease Cousin    Colon cancer Other        Grandmother's Family (x 1 brother to grandmother and 1 sister to grandmother)   Esophageal cancer Neg Hx    Stomach cancer Neg Hx    Rectal cancer Neg Hx     Social History   Socioeconomic History   Marital status: Married    Spouse name: Domestic partner   Number of children: 0   Years of education: Not on file   Highest education level: Doctorate  Occupational History   Occupation: Automotive engineer professor    Employer: [employer]  Tobacco Use   Smoking status: Never   Smokeless tobacco: Never  Vaping Use   Vaping status: Former  Substance and Sexual Activity   Alcohol use: Yes    Alcohol/week: 10.0 standard drinks of alcohol    Types: 10 Glasses of wine per week    Comment: 2 a day/14 a week   Drug use: Not Currently    Types: Amyl nitrate, Marijuana   Sexual activity: Yes    Birth control/protection: None  Other Topics Concern   Not on  file  Social History Narrative   Lives with husband Chipper Oman also a patient of Dr. Durene Cal). No pets or adopted children or natural children.       Retired in Educational psychologist for religious studies at [employer] . Still writing every morning. Also husband is.       Hobbies: walking, hiking, reading, music, cooking , gardening.       Right handed.    Social Determinants of Health   Financial Resource Strain: Low Risk  (04/04/2023)   Overall Financial Resource Strain (CARDIA)    Difficulty of Paying Living Expenses: Not hard at all  Food Insecurity: No Food Insecurity (04/04/2023)   Hunger Vital Sign    Worried About Running Out of Food in the Last Year: Never true    Ran Out of Food in the Last Year: Never true  Transportation Needs: No Transportation Needs (04/04/2023)   PRAPARE - Administrator, Civil Service (Medical): No    Lack of Transportation (Non-Medical): No  Physical Activity: Sufficiently Active (04/04/2023)   Exercise Vital Sign    Days of Exercise per Week: 7 days    Minutes of Exercise per Session: 40 min  Stress: Stress Concern Present (04/04/2023)   [employer] of Occupational Health - Occupational Stress Questionnaire    Feeling of Stress : To some extent  Social Connections: Moderately Integrated (04/04/2023)   Social Connection and Isolation Panel [NHANES]    Frequency of Communication with Friends and Family: More than three times a week    Frequency of Social Gatherings with Friends and Family: More than three times a week    Attends Religious Services: 1 to 4 times  per year    Active Member of Clubs or Organizations: No    Attends Banker Meetings: Not on file    Marital Status: Married  Catering manager Violence: Not on file    Review of Systems:  All systems reviewed were negative except where noted in HPI.   Physical Exam:  General:  Alert, well-developed, in NAD Head:  Normocephalic and atraumatic. Eyes:  Sclera clear, no  icterus.   Conjunctiva pink. Ears:  Normal auditory acuity. Mouth:  No deformity or lesions.  Neck:  Supple; no masses. Lungs:  Clear throughout to auscultation.   No wheezes, crackles, or rhonchi.  Heart:  Regular rate and rhythm; no murmurs. Abdomen:  Soft, nondistended, nontender. No masses, hepatomegaly. No palpable masses.  Normal bowel sounds.    Rectal:  Deferred   Msk:  Symmetrical without gross deformities. Extremities:  Without edema. Neurologic:  Alert and  oriented x 4; grossly normal neurologically. Skin:  Intact without significant lesions or rashes. Psych:  Alert and cooperative. Normal mood and affect.   Venita Lick. Russella Dar  08/02/2023, 8:27 AM See Loretha Stapler,  GI, to contact our on call provider

## 2023-08-02 NOTE — Progress Notes (Signed)
Called to room to assist during endoscopic procedure.  Patient ID and intended procedure confirmed with present staff. Received instructions for my participation in the procedure from the performing physician.  

## 2023-08-03 ENCOUNTER — Other Ambulatory Visit: Payer: Self-pay

## 2023-08-03 ENCOUNTER — Telehealth: Payer: Self-pay

## 2023-08-03 NOTE — Telephone Encounter (Signed)
  Follow up Call-     08/02/2023    7:43 AM 08/02/2023    7:34 AM  Call back number  Post procedure Call Back phone  # [phone]   Permission to leave phone message Yes Yes     Patient questions:  Do you have a fever, pain , or abdominal swelling? No. Pain Score  0 *  Have you tolerated food without any problems? Yes.    Have you been able to return to your normal activities? Yes.    Do you have any questions about your discharge instructions: Diet   No. Medications  No. Follow up visit  No.  Do you have questions or concerns about your Care? No.  Actions: * If pain score is 4 or above: No action needed, pain <4.

## 2023-08-04 LAB — SURGICAL PATHOLOGY

## 2023-08-07 ENCOUNTER — Other Ambulatory Visit: Payer: Self-pay

## 2023-08-07 ENCOUNTER — Encounter: Payer: Self-pay | Admitting: Gastroenterology

## 2023-08-17 ENCOUNTER — Other Ambulatory Visit: Payer: Self-pay | Admitting: Neurology

## 2023-08-17 ENCOUNTER — Other Ambulatory Visit: Payer: Self-pay

## 2023-08-18 ENCOUNTER — Encounter: Payer: Self-pay | Admitting: Neurology

## 2023-08-18 ENCOUNTER — Telehealth: Payer: Self-pay

## 2023-08-18 ENCOUNTER — Other Ambulatory Visit: Payer: Self-pay

## 2023-08-18 MED ORDER — ZONISAMIDE 100 MG PO CAPS
200.0000 mg | ORAL_CAPSULE | Freq: Every day | ORAL | 0 refills | Status: DC
  Filled 2023-08-18: qty 180, 90d supply, fill #0

## 2023-08-18 NOTE — Telephone Encounter (Signed)
Medication Samples have been provided to the patient.  Drug name: ajovy       Strength: 225 mg        Qty: 2   LOT: [id number]  Exp.Date: 02/2025   Dosing instructions: two at once  The patient has been instructed regarding the correct time, dose, and frequency of taking this medication, including desired effects and most common side effects.   Leida Lauth 3:38 PM 08/18/2023

## 2023-08-25 ENCOUNTER — Other Ambulatory Visit: Payer: Self-pay

## 2023-08-25 MED ORDER — MELOXICAM 15 MG PO TABS
15.0000 mg | ORAL_TABLET | Freq: Every day | ORAL | 1 refills | Status: DC
  Filled 2023-08-25: qty 90, 90d supply, fill #0

## 2023-08-25 MED ORDER — TESTOSTERONE 20.25 MG/ACT (1.62%) TD GEL
TRANSDERMAL | 0 refills | Status: DC
  Filled 2023-08-25 – 2023-09-04 (×2): qty 225, 90d supply, fill #0

## 2023-08-26 ENCOUNTER — Encounter: Payer: Self-pay | Admitting: Family Medicine

## 2023-08-28 ENCOUNTER — Other Ambulatory Visit: Payer: Self-pay

## 2023-09-04 ENCOUNTER — Other Ambulatory Visit: Payer: Self-pay

## 2023-09-05 ENCOUNTER — Other Ambulatory Visit: Payer: Self-pay

## 2023-09-06 ENCOUNTER — Other Ambulatory Visit: Payer: Self-pay

## 2023-10-26 ENCOUNTER — Other Ambulatory Visit: Payer: Self-pay

## 2023-10-27 NOTE — Progress Notes (Unsigned)
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  Migraine prevention:  zonisamide 200mg  daily; Ajovy every 4 weeks.   2.  Migraine rescue:  Sumatriptan 100mg .  3.  Limit use of pain relievers to no more than 2 days out of week to prevent risk of rebound or medication-overuse headache. 5.  Keep headache diary 6.  Follow up 6 months.  Subjective:  Jentzen Najarian is a 63 year old Caucasian man with depression, anxiety and Gilbert syndrome who follows up for migraines and possible left-sided trigeminal neuralgia.   UPDATE: Migraines: Migraines infrequent  Has new type of headache (started in December), right sided moderate aching pain, radiating from front back down head and into neck and shoulder.  Soreness in the neck, across suboccipital region and right trapezius.  Pressing into the trapezius triggers pain up back of head.  Lasts few hours with sumatriptan but returns next day.  No associated nausea, photophobia and phonophobia.  Tried different pillows without improvement.  Persistent for 3 weeks but has reduced over the past week and a half.    He does have nausea but may be related to daily use of meloxicam.    Trigeminal neuralgia: No trigeminal neuralgia since last visit.  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  Tested negative for SDT and UTI.  Took levaquin for 10 days which did not help.  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  Notices when lying down.  No rash but now notices a bit of a rash which may be related to antibiotic and topical ointment treatment.  Some nonradiating low back pain which he thinks is likely musculoskeletal from exercise.  No  bowel or bladder dysfunction.  He has chronic anorgasmia but has had an orgasm which aggravated pain.  No associated numbness.  Ultrasound of the scrotum on 05/02/2023 revealed small bilateral hydroceles but no evidence of testicular torsion or epididymo-orchitis.  MRI of pelvis with and without contrast on 07/03/2023 personally reviewed braod central disc protrusion at L5-S1 and mild bilateral facet arthropathy at L4-5 and L5-S1 but no acute findings or abnormalities involving the pudendal nerves or along the course of the pudendal nerves. Pain is not localized at the proximal gracilis muscle, side of testicle and left inguinal region.  Query now ilioinguinal neuralgia.  Mostly improved.     Current NSAIDS:  none Current analgesics:  Sumatriptan 100mg  Current triptans:  none Current ergotamine:  none Current anti-emetic: Promethazine 12.5 mg Current muscle relaxants: none Current anti-anxiolytic:  none Current sleep aide: melatonin  Current Antihypertensive medications: metoprolol succinate 25mg  QHS; losartan Current Antidepressant medications: none Current Anticonvulsant medications:  zonisamide 200mg  daily Current anti-CGRP:  Ajovy Current Vitamins/Herbal/Supplements: Vitamin D, melatonin Current Antihistamines/Decongestants: Benadryl, Zyrtec Other therapy:  none Other medications:  none   Caffeine:  1 mug of coffee daily  Diet:  Drinks water Exercise:  yes Depression:  yes; Anxiety: yes - started Pristiq in February and now improved. Other pain:  Muscle aches Sleep hygiene:  Well   HISTORY:  Onset:  Childhood and then since late 20s Location:  Unilateral (either side)  left behind eye radiating to back of head Quality:  pressure Initial Intensity:  7/10.  He denies new headache, thunderclap headache.  Sometimes wakes up with them. Aura:  On rare occasions had jagged lines in vision Premonitory Phase:  Mild numb sensation, flushing of ear on side of headache Postdrome:  A day he has  a fatigue sensation Associated symptoms:  Photophobia, phonophobia, osmophobia, nausea.  He denies associated unilateral numbness or weakness. Initial Duration:  5-6 hours (if takes sumatriptan immediately, then 1 hour) Initial Frequency:  10 to 15 days Initial Frequency of abortive medication: at least 10 days a month Triggers:  Some strong smells,chocolate, red wine, often after a stressful event, exposed to direct sunlight Relieving factors:  Sumatriptan Activity:  aggravates   In March 2020, he had a new migraine associated with aura.  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  He has had one subsequent event.     In October-November 2020, He had an episode of possible left-sided trigeminal neuralgia.  He described a spasm on side of his left eye radiating to the cheek.  It was an electric shock.  It lasted a second and occurred 3 times.  Eye exam was fine. Lower back pain - no numbness, burning    Past NSAIDS:  Ibuprofen, naproxen Past analgesics:  Tylenol Past abortive triptans: rizatriptan, eletriptan Past abortive ergotamine:  none Past muscle relaxants:  none Past anti-emetic:  none Past antihypertensive medications:  propranolol (caused fatigue and bradycardia) Past antidepressant medications:  doxepin, amitriptyline (anorgasmia) Past anticonvulsant medications:  none Past anti-CGRP:  Nurtec (effective but comparable to sumatriptan in efficacy), Emgality Past vitamins/Herbal/Supplements:  none Past antihistamines/decongestants:  none Other past therapies:  none   Family history of headache:  Father, niece   PAST MEDICAL HISTORY: Past Medical History:  Diagnosis Date   Allergic rhinitis    Allergy    seasonal   Anemia    One early in puberty   Arthritis    Depression    Not currently?   GERD (gastroesophageal reflux disease)    Sullivan Lone syndrome    Heart murmur    tricuspid valve regurgitation    Hyperlipidemia    Hypertension    Internal hemorrhoids    Migraines    Palpitations    Panic attack    occasional   Syncope    on airplane in 12/2016/ due to stress   Ulcer 96?   Duocdenal   Ulcerative proctosigmoiditis (HCC) 2001    MEDICATIONS: Current Outpatient Medications on File Prior to Visit  Medication Sig Dispense Refill   balsalazide (COLAZAL) 750 MG capsule TAKE 3 CAPSULES 3 TIMES    DAILY (Patient taking differently: Take 750 mg by mouth in the morning and at bedtime. TAKE 3 CAPSULES 2 TIMES    DAILY) 810 capsule 1   cetirizine (ZYRTEC) 10 MG tablet Take 10 mg by mouth daily.     chlorthalidone (HYGROTON) 25 MG tablet TAKE 1 TABLET DAILY (Patient taking differently: Take 25 mg by mouth daily. 1/2 tab) 90 tablet 3   diphenhydrAMINE (BENADRYL) 25 MG tablet Take 25 mg by mouth at bedtime as needed.     fluticasone (FLONASE) 50 MCG/ACT nasal spray Place 2 sprays into both nostrils daily. 48 g 3   Fremanezumab-vfrm (AJOVY) 225 MG/1.5ML SOAJ Inject 225 mg into the skin every 28 (twenty-eight) days. 1.5 mL 5   gabapentin (NEURONTIN) 100 MG capsule Take 1 capsule (100 mg total) by mouth 3 (three) times daily as needed. (Patient not taking: Reported on 08/02/2023) 90 capsule 1   ibuprofen (ADVIL,MOTRIN) 200 MG  tablet Take 200 mg by mouth as needed.   (Patient not taking: Reported on 07/04/2023)     losartan (COZAAR) 50 MG tablet Take 1 tablet (50 mg total) by mouth daily. 90 tablet 3   meloxicam (MOBIC) 15 MG tablet Take 1 tablet (15 mg total) by mouth daily. 30 tablet 1   meloxicam (MOBIC) 15 MG tablet Take 1 tablet (15 mg total) by mouth daily. 90 tablet 1   mesalamine (CANASA) 1000 MG suppository Place 1 suppository (1,000 mg total) rectally at bedtime. (Patient not taking: Reported on 05/26/2023) 30 suppository 5   metoprolol succinate (TOPROL-XL) 25 MG 24 hr tablet TAKE 1 TABLET DAILY 90 tablet 3   SUMAtriptan (IMITREX) 100 MG tablet TAKE 1 TABLET AS NEEDED FOR MIGRAINE. MAY  REPEAT AFTER 2 HOURS. MAXIMUM 2 TABLETS IN 24 HOURS 9 tablet 5   Testosterone (ANDROGEL PUMP) 20.25 MG/ACT (1.62%) GEL Apply 2 pumps topically every morning.  Apply to clean/intact skin on shoulder, upper arms, or stomach 75 g 3   Testosterone (ANDROGEL PUMP) 20.25 MG/ACT (1.62%) GEL Apply 2 pumps topically every morning. Apply to clean/intact skin on shoulder, upper arms, or stomach 225 g 0   Vitamin D, Cholecalciferol, 50 MCG (2000 UT) CAPS Take 1 capsule by mouth daily.     zonisamide (ZONEGRAN) 100 MG capsule Take 2 capsules (200 mg total) by mouth daily. 180 capsule 0   [DISCONTINUED] glycopyrrolate (ROBINUL) 2 MG tablet Take 2 mg by mouth 2 (two) times daily.       No current facility-administered medications on file prior to visit.     ALLERGIES: Allergies  Allergen Reactions   Shellfish Allergy Nausea And Vomiting    scallops   Citrus     Oral pain, rash in mouth   Codeine Nausea Only   Salmon [Fish Allergy] Nausea Only and Rash    Especially smoked salmon    FAMILY HISTORY: Family History  Problem Relation Age of Onset   Arthritis Mother    Cancer Mother    Thyroid disease Father    Heart disease Father        a fib, expanded aorta   Hearing loss Father    Heart disease Brother        marfanoid related- connective tissue related   Varicose Veins Brother    Aortic aneurysm Brother        tear requiring intensive care in 2024. descending thoracic   GER disease Brother    Colon polyps Brother    Other Brother        #small vessel ischemic disease in brother- no evidence of stroke. Some memory changes. Thyroid/b12 etc workup.   Hearing loss Brother    Crohn's disease Paternal Aunt    Colon polyps Maternal Grandmother        Earlie Raveling, [place] Uncle   Crohn's disease Cousin    Colon cancer Other        Grandmother's Family (x 1 brother to grandmother and 1 sister to grandmother)   Esophageal cancer Neg Hx    Stomach cancer Neg Hx    Rectal cancer Neg Hx        Objective:  Blood pressure 116/72, pulse 72, height 5\' 10"  (1.778 m), weight 189 lb 6.4 oz (85.9 kg), SpO2 100%. General: No acute distress.  Patient appears well-groomed.   Head:  Normocephalic/atraumatic Neck:  Supple.  No paraspinal tenderness.  Full range of motion. Heart:  Regular rate and rhythm. Neuro:  Alert and  oriented.  Speech fluent and not dysarthric.  Language intact.  CN II-XII intact.  Bulk and tone normal.  Muscle strength 5/5 throughout.  Deep tendon reflexes 2+ throughout.  Gait normal.  Romberg negative.   Shon Millet, DO  CC: Tana Conch, MD

## 2023-10-30 ENCOUNTER — Other Ambulatory Visit: Payer: Self-pay

## 2023-10-30 ENCOUNTER — Ambulatory Visit: Payer: 59 | Admitting: Neurology

## 2023-10-30 VITALS — BP 116/72 | HR 72 | Ht 70.0 in | Wt 189.4 lb

## 2023-10-30 DIAGNOSIS — G43009 Migraine without aura, not intractable, without status migrainosus: Secondary | ICD-10-CM

## 2023-10-30 DIAGNOSIS — G5790 Unspecified mononeuropathy of unspecified lower limb: Secondary | ICD-10-CM | POA: Diagnosis not present

## 2023-10-30 MED ORDER — AJOVY 225 MG/1.5ML ~~LOC~~ SOAJ: 225.0000 mg | SUBCUTANEOUS | 5 refills | Status: DC

## 2023-10-30 MED ORDER — SUMATRIPTAN SUCCINATE 100 MG PO TABS
ORAL_TABLET | ORAL | 5 refills | Status: DC
  Filled 2023-10-30: qty 9, fill #0
  Filled 2023-11-08: qty 9, 30d supply, fill #0
  Filled 2023-12-09: qty 9, 30d supply, fill #1
  Filled 2023-12-29 – 2024-01-01 (×2): qty 9, 30d supply, fill #2
  Filled 2024-02-02: qty 9, 30d supply, fill #3
  Filled 2024-02-29: qty 9, 30d supply, fill #4
  Filled 2024-03-24: qty 9, 30d supply, fill #5

## 2023-10-30 MED ORDER — ZONISAMIDE 100 MG PO CAPS
200.0000 mg | ORAL_CAPSULE | Freq: Every day | ORAL | 3 refills | Status: DC
  Filled 2023-10-30: qty 180, 90d supply, fill #0
  Filled 2023-11-16: qty 104, 52d supply, fill #0
  Filled 2023-11-17: qty 76, 38d supply, fill #0
  Filled 2024-02-08: qty 180, 90d supply, fill #1

## 2023-10-31 ENCOUNTER — Encounter: Payer: Self-pay | Admitting: Neurology

## 2023-11-03 ENCOUNTER — Other Ambulatory Visit: Payer: Self-pay

## 2023-11-03 MED ORDER — TESTOSTERONE 20.25 MG/ACT (1.62%) TD GEL
2.0000 | Freq: Every morning | TRANSDERMAL | 3 refills | Status: DC
  Filled 2023-11-03: qty 75, 150d supply, fill #0

## 2023-11-08 ENCOUNTER — Other Ambulatory Visit: Payer: Self-pay

## 2023-11-08 ENCOUNTER — Other Ambulatory Visit: Payer: Self-pay | Admitting: Neurology

## 2023-11-09 ENCOUNTER — Other Ambulatory Visit: Payer: Self-pay

## 2023-11-09 ENCOUNTER — Other Ambulatory Visit: Payer: Self-pay | Admitting: Neurology

## 2023-11-09 MED ORDER — AJOVY 225 MG/1.5ML ~~LOC~~ SOAJ
225.0000 mg | SUBCUTANEOUS | 5 refills | Status: DC
  Filled 2023-11-09 – 2023-11-16 (×2): qty 1.5, 28d supply, fill #0
  Filled 2023-12-12: qty 1.5, 28d supply, fill #1
  Filled 2024-01-03: qty 1.5, 28d supply, fill #2
  Filled 2024-02-02: qty 1.5, 28d supply, fill #3
  Filled 2024-02-29: qty 1.5, 28d supply, fill #4
  Filled 2024-04-01: qty 1.5, 28d supply, fill #5

## 2023-11-14 ENCOUNTER — Other Ambulatory Visit: Payer: Self-pay

## 2023-11-16 ENCOUNTER — Other Ambulatory Visit: Payer: Self-pay

## 2023-11-17 ENCOUNTER — Other Ambulatory Visit: Payer: Self-pay

## 2023-11-23 ENCOUNTER — Other Ambulatory Visit: Payer: Self-pay

## 2023-11-24 ENCOUNTER — Other Ambulatory Visit: Payer: Self-pay

## 2023-11-24 MED ORDER — TRINTELLIX 10 MG PO TABS
10.0000 mg | ORAL_TABLET | Freq: Every day | ORAL | 1 refills | Status: DC
  Filled 2023-11-24: qty 30, 30d supply, fill #0

## 2023-11-27 ENCOUNTER — Ambulatory Visit (INDEPENDENT_AMBULATORY_CARE_PROVIDER_SITE_OTHER): Payer: 59 | Admitting: Family Medicine

## 2023-11-27 ENCOUNTER — Encounter: Payer: Self-pay | Admitting: Family Medicine

## 2023-11-27 VITALS — BP 110/78 | HR 81 | Temp 97.0°F | Ht 70.0 in | Wt 185.4 lb

## 2023-11-27 DIAGNOSIS — Z23 Encounter for immunization: Secondary | ICD-10-CM

## 2023-11-27 DIAGNOSIS — I10 Essential (primary) hypertension: Secondary | ICD-10-CM | POA: Diagnosis not present

## 2023-11-27 DIAGNOSIS — E785 Hyperlipidemia, unspecified: Secondary | ICD-10-CM

## 2023-11-27 DIAGNOSIS — K5130 Ulcerative (chronic) rectosigmoiditis without complications: Secondary | ICD-10-CM | POA: Diagnosis not present

## 2023-11-27 NOTE — Progress Notes (Signed)
Phone [phone] In person visit   Subjective:   [name] is a 63 y.o. year old very pleasant male patient who presents for/with See problem oriented charting Chief Complaint  Patient presents with   Hypertension    124/82 last bp. Pt wants to talk about depression meds and wants to update dr on chronic pain.   Past Medical History-  Patient Active Problem List   Diagnosis Date Noted   Major depression, recurrent, full remission (HCC) 08/16/2018    Priority: High   ULCERATIVE COLITIS-LEFT SIDE.  Follows with Dr. Russella Dar of [facility] 05/06/2008    Priority: High   Insomnia 06/23/2022    Priority: Medium    Raynaud's disease 11/15/2019    Priority: Medium    Essential hypertension 03/12/2019    Priority: Medium    Gilbert syndrome 01/26/2018    Priority: Medium    Hyperglycemia 01/26/2018    Priority: Medium    History of adenomatous polyp of colon 05/22/2017    Priority: Medium    PSORIASIS, SCALP 05/03/2010    Priority: Medium    Hyperlipidemia 05/15/2007    Priority: Medium    Migraines 05/03/2007    Priority: Medium    Vasovagal syncope 02/14/2017    Priority: Low   Hearing loss 08/04/2016    Priority: Low   Low back pain 02/09/2016    Priority: Low   DOE (dyspnea on exertion) 04/15/2014    Priority: Low   Localized osteoarthrosis, lower leg 10/12/2010    Priority: Low   Vitamin D deficiency 10/27/2009    Priority: Low   GERD 01/31/2008    Priority: Low   INTERNAL HEMORRHOIDS 01/23/2008    Priority: Low   Allergic rhinitis 05/03/2007    Priority: Low   Family history of alpha 1 antitrypsin deficiency 06/14/2018    Medications- reviewed and updated Current Outpatient Medications  Medication Sig Dispense Refill   balsalazide (COLAZAL) 750 MG capsule TAKE 3 CAPSULES 3 TIMES    DAILY (Patient taking differently: Take 750 mg by mouth in the morning and at bedtime. TAKE 3 CAPSULES 2 TIMES    DAILY) 810 capsule 1   cetirizine (ZYRTEC) 10 MG tablet Take  10 mg by mouth daily.     chlorthalidone (HYGROTON) 25 MG tablet TAKE 1 TABLET DAILY (Patient taking differently: Take 25 mg by mouth daily. 1/2 tab) 90 tablet 3   diphenhydrAMINE (BENADRYL) 25 MG tablet Take 25 mg by mouth at bedtime as needed.     fluticasone (FLONASE) 50 MCG/ACT nasal spray Place 2 sprays into both nostrils daily. 48 g 3   Fremanezumab-vfrm (AJOVY) 225 MG/1.5ML SOAJ Inject 225 mg into the skin every 28 (twenty-eight) days. 1.5 mL 5   losartan (COZAAR) 50 MG tablet Take 1 tablet (50 mg total) by mouth daily. 90 tablet 3   mesalamine (CANASA) 1000 MG suppository Place 1 suppository (1,000 mg total) rectally at bedtime. 30 suppository 5   metoprolol succinate (TOPROL-XL) 25 MG 24 hr tablet TAKE 1 TABLET DAILY 90 tablet 3   SUMAtriptan (IMITREX) 100 MG tablet TAKE 1 TABLET AS NEEDED FOR MIGRAINE. MAY REPEAT AFTER 2 HOURS. MAXIMUM 2 TABLETS IN 24 HOURS 9 tablet 5   Testosterone (ANDROGEL PUMP) 20.25 MG/ACT (1.62%) GEL Apply 2 pumps topically every morning. Apply to clean/intact skin on shoulder, upper arms, or stomach 225 g 0   Vitamin D, Cholecalciferol, 50 MCG (2000 UT) CAPS Take 1 capsule by mouth daily.     vortioxetine HBr (TRINTELLIX) 10  MG TABS tablet Take 1 tablet by mouth daily 30 tablet 1   zonisamide (ZONEGRAN) 100 MG capsule Take 2 capsules (200 mg total) by mouth daily. 180 capsule 3   No current facility-administered medications for this visit.     Objective:  BP 110/78   Pulse 81   Temp (!) 97 F (36.1 C) (Temporal)   Ht 5\' 10"  (1.778 m)   Wt 185 lb 6.4 oz (84.1 kg)   SpO2 98%   BMI 26.60 kg/m  Gen: NAD, resting comfortably CV: RRR no murmurs rubs or gallops Lungs: CTAB no crackles, wheeze, rhonchi Ext: no edema Skin: warm, dry     Assessment and Plan   #Health maintenance -  Tetanus, Diphtheria, and Pertussis (Tdap)   % Ulcerative colitis-followed with Dr. Russella Dar on balsalazide  -some nausea and upper gastroenterology discomfort- coming off mobic  helped some. Some constipation- used canasa suppositories and found helpful.   #hypertension S: medication: Chlorthalidone 12.5 mg, losartan 50 mg, metoprolol 25 mg XR (also helps migraines) BP Readings from Last 3 Encounters:  11/27/23 110/78  10/30/23 116/72  08/02/23 (!) 107/44  A/P: well controlled continue current medications  -some cramping in feet- wants to check labs next visit- add magnesium  #hyperlipidemia- Ct calcium scoring of 0 on 07/02/22 S: Medication:none Lab Results  Component Value Date   CHOL 244 (H) 05/31/2023   HDL 60.20 05/31/2023   LDLCALC 165 (H) 05/31/2023   LDLDIRECT 148.0 10/15/2018   TRIG 92.0 05/31/2023   CHOLHDL 4 05/31/2023   A/P: #s high but risk lower with CT calcium- recheck likely 2028   # Depression/ anxiety/ insomnia- poor sleep S:Medication: trintellix 10 mg through psychiatric NP- some dry mouth- started on friday -before recent visit some sadness and weepiness. Caring for parents has been tough. Getting his book out to press was helpful Therapist:continues to work with      11/27/2023   10:14 AM 05/26/2023    3:16 PM 04/05/2023    2:33 PM  Depression screen PHQ 2/9  Decreased Interest 0 1 0  Down, Depressed, Hopeless 2 1 0  PHQ - 2 Score 2 2 0  Altered sleeping 0 1   Tired, decreased energy 1 1   Change in appetite 0 0   Feeling bad or failure about yourself  1 0   Trouble concentrating 0 0   Moving slowly or fidgety/restless 0 0   Suicidal thoughts 0 0   PHQ-9 Score 4 4   Difficult doing work/chores Somewhat difficult Somewhat difficult   A/P: #s ok today but certainly still working through some issues- hoping the trintellix change is helpful.   -may trial off of benadryl once things stabilize with trintellix  # Low testosterone-managed by urology Dr. Lafonda Mosses -Also with history of pelvic and genital pain in June 2024-STD and urine testing was unremarkable and meloxicam ended up being helpful. He had tapered off after 6 months- mild  increase off of it. Occasional flares but not as bad as it was - may boost gabapentin as first step to avoid long term NSAIDs as possible- thought to be more neurological- ilioinguinal nerve distribution -some penile pain- from prolonged sitting- has to avoid  #Acupuncture- working with Truitt Merle to see if helps with the nerve related pain  Recommended follow up: Return in about 6 months (around 05/26/2024) for physical or sooner if needed.Schedule b4 you leave. Future Appointments  Date Time Provider Department Center  04/29/2024  9:30 AM Drema Dallas,  DO [facility] None   Lab/Order associations:   ICD-10-CM   1. Hyperlipidemia, unspecified hyperlipidemia type  E78.5     2. 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 Essential hypertension  I10       No orders of the defined types were placed in this encounter.   Return precautions advised.  Tana Conch, MD

## 2023-11-27 NOTE — Addendum Note (Signed)
Addended by: Renetta Chalk on: 11/27/2023 11:00 AM   Modules accepted: Orders

## 2023-11-27 NOTE — Patient Instructions (Addendum)
Health Maintenance Due  Topic Date Due   DTaP/Tdap/Td (3 - Td or Tdap) 11/02/2023  Tetanus, Diphtheria, and Pertussis (Tdap) today   Hope the trintellix his helpful with minimal side effects   Recommended follow up: Return in about 6 months (around 05/26/2024) for physical or sooner if needed.Schedule b4 you leave.

## 2023-11-28 ENCOUNTER — Other Ambulatory Visit: Payer: Self-pay

## 2023-12-01 ENCOUNTER — Other Ambulatory Visit: Payer: Self-pay

## 2023-12-03 ENCOUNTER — Other Ambulatory Visit: Payer: Self-pay

## 2023-12-04 ENCOUNTER — Other Ambulatory Visit: Payer: Self-pay

## 2023-12-04 MED ORDER — TESTOSTERONE 20.25 MG/ACT (1.62%) TD GEL
40.5000 mg | Freq: Every morning | TRANSDERMAL | 1 refills | Status: DC
  Filled 2023-12-04: qty 150, 60d supply, fill #0
  Filled 2024-02-08: qty 150, 60d supply, fill #1

## 2023-12-06 ENCOUNTER — Other Ambulatory Visit: Payer: Self-pay

## 2023-12-08 ENCOUNTER — Other Ambulatory Visit: Payer: Self-pay

## 2023-12-08 ENCOUNTER — Other Ambulatory Visit: Payer: Self-pay | Admitting: Family Medicine

## 2023-12-12 ENCOUNTER — Other Ambulatory Visit: Payer: Self-pay

## 2023-12-26 ENCOUNTER — Other Ambulatory Visit: Payer: Self-pay

## 2023-12-28 ENCOUNTER — Other Ambulatory Visit: Payer: Self-pay

## 2023-12-28 MED ORDER — TRAZODONE HCL 50 MG PO TABS
50.0000 mg | ORAL_TABLET | Freq: Every evening | ORAL | 0 refills | Status: DC | PRN
  Filled 2023-12-28: qty 180, 90d supply, fill #0

## 2023-12-28 MED ORDER — LAMOTRIGINE 25 MG PO TABS
ORAL_TABLET | ORAL | 0 refills | Status: DC
  Filled 2023-12-28: qty 180, 90d supply, fill #0

## 2023-12-29 ENCOUNTER — Other Ambulatory Visit: Payer: Self-pay

## 2024-01-01 ENCOUNTER — Other Ambulatory Visit: Payer: Self-pay

## 2024-01-04 ENCOUNTER — Other Ambulatory Visit: Payer: Self-pay

## 2024-02-08 ENCOUNTER — Other Ambulatory Visit: Payer: Self-pay

## 2024-02-09 ENCOUNTER — Other Ambulatory Visit: Payer: Self-pay

## 2024-02-12 ENCOUNTER — Other Ambulatory Visit: Payer: Self-pay | Admitting: Family Medicine

## 2024-02-15 ENCOUNTER — Other Ambulatory Visit: Payer: Self-pay

## 2024-02-15 MED ORDER — LAMOTRIGINE 100 MG PO TABS
100.0000 mg | ORAL_TABLET | Freq: Every day | ORAL | 1 refills | Status: DC
Start: 2024-02-15 — End: 2024-03-14
  Filled 2024-02-15: qty 30, 30d supply, fill #0

## 2024-02-15 MED ORDER — TRAZODONE HCL 100 MG PO TABS
200.0000 mg | ORAL_TABLET | Freq: Every day | ORAL | 1 refills | Status: DC
  Filled 2024-02-15: qty 60, 30d supply, fill #0

## 2024-02-19 ENCOUNTER — Other Ambulatory Visit: Payer: Self-pay

## 2024-03-01 ENCOUNTER — Other Ambulatory Visit: Payer: Self-pay

## 2024-03-06 ENCOUNTER — Encounter: Payer: Self-pay | Admitting: Family Medicine

## 2024-03-06 NOTE — Telephone Encounter (Signed)
 See below

## 2024-03-14 ENCOUNTER — Encounter: Payer: Self-pay | Admitting: Family Medicine

## 2024-03-14 ENCOUNTER — Ambulatory Visit: Admitting: Family Medicine

## 2024-03-14 ENCOUNTER — Encounter: Payer: Self-pay | Admitting: Pharmacy

## 2024-03-14 ENCOUNTER — Ambulatory Visit: Attending: Family Medicine

## 2024-03-14 VITALS — BP 100/70 | HR 76 | Temp 97.3°F | Ht 70.0 in | Wt 185.4 lb

## 2024-03-14 DIAGNOSIS — R000 Tachycardia, unspecified: Secondary | ICD-10-CM

## 2024-03-14 DIAGNOSIS — F3342 Major depressive disorder, recurrent, in full remission: Secondary | ICD-10-CM

## 2024-03-14 DIAGNOSIS — I10 Essential (primary) hypertension: Secondary | ICD-10-CM

## 2024-03-14 DIAGNOSIS — R002 Palpitations: Secondary | ICD-10-CM

## 2024-03-14 LAB — CBC WITH DIFFERENTIAL/PLATELET
Basophils Absolute: 0.1 10*3/uL (ref 0.0–0.1)
Basophils Relative: 1.1 % (ref 0.0–3.0)
Eosinophils Absolute: 0.1 10*3/uL (ref 0.0–0.7)
Eosinophils Relative: 2.7 % (ref 0.0–5.0)
HCT: 43.5 % (ref 39.0–52.0)
Hemoglobin: 15 g/dL (ref 13.0–17.0)
Lymphocytes Relative: 23.7 % (ref 12.0–46.0)
Lymphs Abs: 1.2 10*3/uL (ref 0.7–4.0)
MCHC: 34.6 g/dL (ref 30.0–36.0)
MCV: 86.6 fl (ref 78.0–100.0)
Monocytes Absolute: 0.5 10*3/uL (ref 0.1–1.0)
Monocytes Relative: 8.9 % (ref 3.0–12.0)
Neutro Abs: 3.2 10*3/uL (ref 1.4–7.7)
Neutrophils Relative %: 63.6 % (ref 43.0–77.0)
Platelets: 186 10*3/uL (ref 150.0–400.0)
RBC: 5.03 Mil/uL (ref 4.22–5.81)
RDW: 13 % (ref 11.5–15.5)
WBC: 5.1 10*3/uL (ref 4.0–10.5)

## 2024-03-14 LAB — TSH: TSH: 0.72 u[IU]/mL (ref 0.35–5.50)

## 2024-03-14 NOTE — Progress Notes (Signed)
 Phone [phone] In person visit   Subjective:   [name] is a 63 y.o. year old very pleasant male patient who presents for/with See problem oriented charting Chief Complaint  Patient presents with   Tachycardia    Pt c/o increased tachycardia, denies CP.    family hx alzheimers    Pt states brother has recent dx of Alzheimers.   Past Medical History-  Patient Active Problem List   Diagnosis Date Noted   Major depression, recurrent, full remission (HCC) 08/16/2018    Priority: High   ULCERATIVE COLITIS-LEFT SIDE.  Follows with Dr. Sandrea Cruel of [facility] 05/06/2008    Priority: High   Insomnia 06/23/2022    Priority: Medium    Raynaud's disease 11/15/2019    Priority: Medium    Essential hypertension 03/12/2019    Priority: Medium    Gilbert syndrome 01/26/2018    Priority: Medium    Hyperglycemia 01/26/2018    Priority: Medium    History of adenomatous polyp of colon 05/22/2017    Priority: Medium    PSORIASIS, SCALP 05/03/2010    Priority: Medium    Hyperlipidemia 05/15/2007    Priority: Medium    Migraines 05/03/2007    Priority: Medium    Vasovagal syncope 02/14/2017    Priority: Low   Hearing loss 08/04/2016    Priority: Low   Low back pain 02/09/2016    Priority: Low   DOE (dyspnea on exertion) 04/15/2014    Priority: Low   Localized osteoarthrosis, lower leg 10/12/2010    Priority: Low   Vitamin D  deficiency 10/27/2009    Priority: Low   GERD 01/31/2008    Priority: Low   INTERNAL HEMORRHOIDS 01/23/2008    Priority: Low   Allergic rhinitis 05/03/2007    Priority: Low   Family history of alpha 1 antitrypsin deficiency 06/14/2018    Medications- reviewed and updated Current Outpatient Medications  Medication Sig Dispense Refill   balsalazide (COLAZAL ) 750 MG capsule TAKE 3 CAPSULES 3 TIMES    DAILY 810 capsule 1   cetirizine (ZYRTEC) 10 MG tablet Take 10 mg by mouth daily.     chlorthalidone  (HYGROTON ) 25 MG tablet TAKE 1 TABLET DAILY  (Patient taking differently: Take 25 mg by mouth daily. 1/2 tab) 90 tablet 3   fluticasone  (FLONASE ) 50 MCG/ACT nasal spray Place 2 sprays into both nostrils daily. 48 g 3   Fremanezumab -vfrm (AJOVY ) 225 MG/1.5ML SOAJ Inject 225 mg into the skin every 28 (twenty-eight) days. 1.5 mL 5   losartan  (COZAAR ) 50 MG tablet TAKE 1 TABLET DAILY 90 tablet 3   mesalamine  (CANASA ) 1000 MG suppository Place 1 suppository (1,000 mg total) rectally at bedtime. 30 suppository 5   metoprolol  succinate (TOPROL -XL) 25 MG 24 hr tablet TAKE 1 TABLET DAILY 90 tablet 3   SUMAtriptan  (IMITREX ) 100 MG tablet TAKE 1 TABLET AS NEEDED FOR MIGRAINE. MAY REPEAT AFTER 2 HOURS. MAXIMUM 2 TABLETS IN 24 HOURS 9 tablet 5   Testosterone  20.25 MG/ACT (1.62%) GEL Apply 2 pumps (40.5 mg) topically in the morning to clean/intact skin on shoulder, upper arms, or stomach 150 g 1   traZODone  (DESYREL ) 100 MG tablet Take 2 tablets (200 mg total) by mouth at bedtime as needed for sleep. 60 tablet 1   Vitamin D , Cholecalciferol, 50 MCG (2000 UT) CAPS Take 1 capsule by mouth daily.     zonisamide  (ZONEGRAN ) 100 MG capsule Take 2 capsules (200 mg total) by mouth daily. 180 capsule 3   No current  facility-administered medications for this visit.     Objective:  BP 100/70   Pulse 76   Temp (!) 97.3 F (36.3 C)   Ht 5\' 10"  (1.778 m)   Wt 185 lb 6.4 oz (84.1 kg)   SpO2 96%   BMI 26.60 kg/m  Gen: NAD, resting comfortably CV: RRR no murmurs rubs or gallops Lungs: CTAB no crackles, wheeze, rhonchi Ext: no edema Skin: warm, dry  EKG: sinus rhythm with rate 71, normal axis, normal intervals, no hypertrophy, no st or t wave changes     Assessment and Plan   #social update and family history- brother diagnosed with dementia at under age 18.  We will certainly look to the neurologist of his brother for any recommendations for screening for patient though not clear at this point  # Palpitation/tachycardia with known history benign sinus  tachycardia S:-still on metoprolol  25 mg XR half tablet- generally controls issues - when he increased Lamictal . Within 2 weeks of increase was having increased night sweats (which is rare) and had no fever, strong palpitations at night- but also more in the day- that seemed longer in duration up to an hour. Occasionally noted heart rate over 100 -could be related to palpitations or not.  -prior gait and speech issues but those are better - felt more agitation- Maryanne freeman took him back off and has done better. Taper started around 21st- now off for about 2 weeks.   - with persistent heart rate and night sweats issues and palpitations.  - has bene on testosterone  for 9 months and not had this previously -he did find one study that suggested with gilberts can slow metabolism of Lamictal   A/P: Worsening of stations and baseline tachycardia that seemed to start with Lamictal  but has persisted despite stopping the Lamictal  over 2 weeks ago.  Reassuring EKG today - Update labs including CBC, CMP, TSH.  Reports reassuring PSA and testosterone  recently with urology - Cardiac monitoring planned with 3-day event monitor especially since these are occurring nightly  For night sweat portion there are certainly some associations with- testoserone, losartan , metoprolol , zonisamide .  Testosterone  started about 9 months ago and that would be my greatest concern - We will consider further night sweat workup if symptoms fail to improve and we do not find a clear cause with labs and monitoring   #hypertension S: medication: Chlorthalidone  12.5 mg, losartan  50 mg, metoprolol  25 mg XR (also helps migraines) BP Readings from Last 3 Encounters:  03/14/24 100/70  11/27/23 110/78  10/30/23 116/72  A/P: Blood pressure well-controlled he did not report lightheadedness today-continue current medication  # Depression-working closely with psychiatry S:Medication: None-holding Lamictal  due to above concerns.  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 Future Appointments  Date Time Provider Department Center  04/29/2024  9:30 AM Merriam Abbey, DO [facility] None  07/29/2024  1:20 PM Almira Jaeger, MD [facility]    Lab/Order associations:   ICD-10-CM   1. Tachycardia  R00.0 EKG 12-Lead    Comprehensive metabolic panel with GFR    CBC with Differential/Platelet    TSH    Cardiac event monitor    2. Palpitations  R00.2 Comprehensive metabolic panel with GFR    CBC with Differential/Platelet    TSH    Cardiac event monitor    3. Essential hypertension  I10     4. Major depression, recurrent, full remission (HCC)  F33.42       No orders of the defined types were placed in this encounter.   Return precautions advised.  Clarisa Crooked, MD

## 2024-03-14 NOTE — Progress Notes (Unsigned)
 EP to read.

## 2024-03-14 NOTE — Addendum Note (Signed)
 Addended by: Almira Jaeger on: 03/14/2024 01:28 PM   Modules accepted: Orders

## 2024-03-14 NOTE — Patient Instructions (Addendum)
 Let me know if you have not heard about event monitor within a week  Please stop by lab before you go If you have mychart- we will send your results within 3 business days of us  receiving them.  If you do not have mychart- we will call you about results within 5 business days of us  receiving them.  *please also note that you will see labs on mychart as soon as they post. I will later go in and write notes on them- will say "notes from Dr. Arlene Ben"   Recommended follow up: Return for as needed for new, worsening, persistent symptoms. If monitor is ok and symptoms persist may move forward with more night sweat focused workup

## 2024-03-15 ENCOUNTER — Ambulatory Visit: Payer: Self-pay | Admitting: Family Medicine

## 2024-03-15 LAB — COMPREHENSIVE METABOLIC PANEL WITH GFR
ALT: 11 U/L (ref 0–53)
AST: 15 U/L (ref 0–37)
Albumin: 4.7 g/dL (ref 3.5–5.2)
Alkaline Phosphatase: 31 U/L — ABNORMAL LOW (ref 39–117)
BUN: 18 mg/dL (ref 6–23)
CO2: 24 meq/L (ref 19–32)
Calcium: 9.8 mg/dL (ref 8.4–10.5)
Chloride: 103 meq/L (ref 96–112)
Creatinine, Ser: 1.14 mg/dL (ref 0.40–1.50)
GFR: 69.47 mL/min (ref >60.00)
Glucose, Bld: 96 mg/dL (ref 70–99)
Potassium: 3.5 meq/L (ref 3.5–5.1)
Sodium: 140 meq/L (ref 135–145)
Total Bilirubin: 1.5 mg/dL — ABNORMAL HIGH (ref 0.2–1.2)
Total Protein: 7.1 g/dL (ref 6.0–8.3)

## 2024-03-21 ENCOUNTER — Other Ambulatory Visit: Payer: Self-pay

## 2024-03-21 MED ORDER — ALPRAZOLAM 0.5 MG PO TABS
0.5000 mg | ORAL_TABLET | Freq: Every day | ORAL | 0 refills | Status: AC | PRN
  Filled 2024-03-21: qty 30, 30d supply, fill #0

## 2024-03-26 ENCOUNTER — Other Ambulatory Visit: Payer: Self-pay

## 2024-04-02 ENCOUNTER — Other Ambulatory Visit: Payer: Self-pay

## 2024-04-05 ENCOUNTER — Other Ambulatory Visit: Payer: Self-pay

## 2024-04-05 MED ORDER — TESTOSTERONE 20.25 MG/ACT (1.62%) TD GEL
2.0000 | Freq: Every morning | TRANSDERMAL | 1 refills | Status: DC
  Filled 2024-04-05: qty 150, 60d supply, fill #0
  Filled 2024-06-18: qty 150, 60d supply, fill #1

## 2024-04-08 DIAGNOSIS — R002 Palpitations: Secondary | ICD-10-CM

## 2024-04-08 DIAGNOSIS — R000 Tachycardia, unspecified: Secondary | ICD-10-CM | POA: Diagnosis not present

## 2024-04-11 ENCOUNTER — Other Ambulatory Visit: Payer: Self-pay

## 2024-04-21 ENCOUNTER — Other Ambulatory Visit: Payer: Self-pay | Admitting: Neurology

## 2024-04-22 ENCOUNTER — Other Ambulatory Visit: Payer: Self-pay

## 2024-04-22 MED ORDER — BELSOMRA 10 MG PO TABS
10.0000 mg | ORAL_TABLET | Freq: Every day | ORAL | 0 refills | Status: DC
  Filled 2024-04-22 – 2024-04-23 (×2): qty 30, 30d supply, fill #0

## 2024-04-22 MED ORDER — SUMATRIPTAN SUCCINATE 100 MG PO TABS
ORAL_TABLET | ORAL | 0 refills | Status: DC
Start: 2024-04-22 — End: 2024-04-29
  Filled 2024-04-22: qty 9, 30d supply, fill #0

## 2024-04-23 ENCOUNTER — Other Ambulatory Visit: Payer: Self-pay

## 2024-04-26 NOTE — Progress Notes (Signed)
 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  Migraine prevention:  Change dosing of Ajovy  to 3 injections every 3 months.  Due to potential side effects, taper zonisamide  to 100mg  daily for one week, then stop 2.  Migraine rescue:  Sumatriptan  100mg .  3.  Limit use of pain relievers to no more than 2 days out of week to prevent risk of rebound or medication-overuse headache. 5.  Keep headache diary 6.  Follow up 8 months.  Subjective:  [name] is a 63 year old Caucasian man with depression, anxiety and Gilbert syndrome who follows up for migraines and possible left-sided trigeminal neuralgia.   UPDATE: Migraines: Notes word-finding difficulty, tinnitus, paresthesias in arms, and blurred vision since starting zonisamide .  Notes some increased migraines which is attributed to increased emotional stress.  He has been averaging 8 migraines a month in which he gets aura requiring sumatriptan  He notes wearing off of the Ajovy  the last week prior to next injection.  In retrospect, he noticed that he didn't have wearing off when he took the every 3 month dosing.  Headache 2: Has new type of headache (started in December 2024), right sided moderate aching pain, radiating from front back down head and into neck and shoulder.  Soreness in the neck, across suboccipital region and right trapezius.  Pressing into the trapezius triggers pain up back of head.  Lasts few hours with sumatriptan  but returns next day.  No associated nausea, photophobia and phonophobia.  Tried different pillows without improvement.  Persistent for 3 weeks but has reduced over the past week and a half.  It subsequently dissipated.  Trigeminal neuralgia: No trigeminal neuralgia since last visit.  Pudendal vs ilioinguinal neuralgia vs  musculoskeletal: Never started on gabapentin .  Improved but has returned.  On Mobic  and scheduled for PT    Current medications: Current NSAIDS:  none Current analgesics:  Sumatriptan  100mg  Current triptans:  none Current ergotamine:  none Current anti-emetic: none Current muscle relaxants: none Current anti-anxiolytic:  none Current sleep aide: melatonin  Current Antihypertensive medications: metoprolol  succinate 25mg  QHS; losartan  Current Antidepressant medications: none Current Anticonvulsant medications:  zonisamide  200mg  daily Current anti-CGRP:  Ajovy  Other therapy:  none Other medications:  Belsomra    Caffeine:  1 mug of coffee daily  Diet:  Drinks water Exercise:  yes Depression:  yes; Anxiety: yes - started Pristiq  in February and now improved. Other pain:  Muscle aches Sleep hygiene:  Working on management with psychiatry.     HISTORY:  Migraine without aura: Onset:  Childhood and then since late 20s Location:  Unilateral (either side)  left behind eye radiating to back of head Quality:  pressure Initial Intensity:  7/10.  He denies new headache, thunderclap headache.  Sometimes wakes up with them. Aura:  On rare occasions had jagged lines in vision Premonitory Phase:  Mild numb sensation, flushing of ear on side of headache Postdrome:  A day he has a fatigue sensation Associated symptoms:  Photophobia, phonophobia, osmophobia, nausea.  He denies associated unilateral numbness or weakness. Initial Duration:  5-6 hours (if takes sumatriptan  immediately, then 1 hour) Initial Frequency:  10 to 15 days Initial Frequency of abortive medication: at least 10 days a month Triggers:  Some strong smells,chocolate, red wine, often after a stressful event, exposed to direct sunlight Relieving factors:  Sumatriptan  Activity:  aggravates  Migraine with aura: In March 2020, he had a new migraine associated with aura.  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  He has had one subsequent event.     Left-sided trigeminal neuralgia: In October-November 2020, He had an episode of possible left-sided trigeminal neuralgia.  He described a spasm on side of his left eye radiating to the cheek.  It was an electric shock.  It lasted a second and occurred 3 times.  Eye exam was fine. 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  Tested negative for SDT and UTI.  Took levaquin  for 10 days which did not help.  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  Notices when lying down.  No rash but now notices a bit of a rash which may be related to antibiotic and topical ointment treatment.  Some nonradiating low back pain which he thinks is likely musculoskeletal from exercise.  No bowel or bladder dysfunction.  He has chronic anorgasmia but has had an orgasm which aggravated pain.  No associated numbness.  Ultrasound of the scrotum on 05/02/2023 revealed small bilateral hydroceles but no evidence of testicular torsion or epididymo-orchitis.  MRI of pelvis with and without contrast on 07/03/2023 personally reviewed braod central disc protrusion at L5-S1 and mild bilateral facet arthropathy at L4-5 and L5-S1 but no acute findings or abnormalities involving the pudendal nerves or along the course of the pudendal nerves. Pain is not localized at the proximal gracilis muscle, side of testicle and left inguinal region.  Query now ilioinguinal neuralgia.  Mostly improved.     Past medications: Past NSAIDS:  Ibuprofen, naproxen Past analgesics:  Tylenol  Past abortive triptans: rizatriptan , eletriptan Past abortive ergotamine:  none Past muscle relaxants:  none Past anti-emetic:  promethazine  Past antihypertensive medications:  propranolol  (caused  fatigue and bradycardia) Past antidepressant medications:  doxepin , amitriptyline  (anorgasmia) Past anticonvulsant medications:  lamotrigine  (adverse reaction -night sweats, racing and pounding heart, elevated blood pressure, blurry vision, increased agitation and very serious anxiety) Past anti-CGRP:  Nurtec (effective but comparable to sumatriptan  in efficacy), Emgality  Past vitamins/Herbal/Supplements:  none Past antihistamines/decongestants:  none Other past therapies:  none   Family history of headache:  Father, niece  Other family history:  early-onset Alzheimer's (brother - diagnosed at age 15)  PAST MEDICAL HISTORY: Past Medical History:  Diagnosis Date   Allergic rhinitis    Allergy    seasonal   Anemia    One early in puberty   Arthritis    Depression    Not currently?   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 30 tablet 0   balsalazide (COLAZAL ) 750 MG  capsule TAKE 3 CAPSULES 3 TIMES    DAILY 810 capsule 1   cetirizine (ZYRTEC) 10 MG tablet Take 10 mg by mouth daily.     chlorthalidone  (HYGROTON ) 25 MG tablet TAKE 1 TABLET DAILY (Patient taking differently: Take 25 mg by mouth daily. 1/2 tab) 90 tablet 3   fluticasone  (FLONASE ) 50 MCG/ACT nasal spray Place 2 sprays into both nostrils daily. 48 g 3   Fremanezumab -vfrm (AJOVY ) 225 MG/1.5ML SOAJ Inject 225 mg into the skin every 28 (twenty-eight) days. 1.5 mL 5   losartan  (COZAAR ) 50 MG tablet TAKE 1  TABLET DAILY 90 tablet 3   mesalamine  (CANASA ) 1000 MG suppository Place 1 suppository (1,000 mg total) rectally at bedtime. 30 suppository 5   metoprolol  succinate (TOPROL -XL) 25 MG 24 hr tablet TAKE 1 TABLET DAILY 90 tablet 3   SUMAtriptan  (IMITREX ) 100 MG tablet TAKE 1 TABLET AS NEEDED FOR MIGRAINE. MAY REPEAT AFTER 2 HOURS. MAXIMUM 2 TABLETS IN 24 HOURS 9 tablet 0   Suvorexant  (BELSOMRA ) 10 MG TABS Take 1 tablet (10 mg total) by mouth at bedtime. 30 tablet 0   Testosterone  20.25 MG/ACT (1.62%) GEL Apply 2 Pump topically in the morning apply to clean/intact skin on shoulder, upper arms, or stomach 150 g 1   Vitamin D , Cholecalciferol, 50 MCG (2000 UT) CAPS Take 1 capsule by mouth daily.     zonisamide  (ZONEGRAN ) 100 MG capsule Take 2 capsules (200 mg total) by mouth daily. 180 capsule 3   [DISCONTINUED] glycopyrrolate (ROBINUL) 2 MG tablet Take 2 mg by mouth 2 (two) times daily.       No current facility-administered medications on file prior to visit.     ALLERGIES: Allergies  Allergen Reactions   Shellfish Allergy Nausea And Vomiting    scallops   Citrus     Oral pain, rash in mouth   Codeine Nausea Only   Salmon [Fish Allergy] Nausea Only and Rash    Especially smoked salmon    FAMILY HISTORY: Family History  Problem Relation Age of Onset   Arthritis Mother        reports apoe gene and bother todd has   Cancer Mother    Thyroid  disease Father    Heart disease Father        a fib, expanded aorta   Hearing loss Father    Heart disease Brother        marfanoid related- connective tissue related   Varicose Veins Brother    Aortic aneurysm Brother        tear requiring intensive care in 2024. descending thoracic   GER disease Brother    Colon polyps Brother    Other Brother        #small vessel ischemic disease in brother- no evidence of stroke. Some memory changes. Thyroid /b12 etc workup.   Hearing loss Brother    Dementia Brother        early onset under 70. amyloid  on pet and on med for amyloid- (negative apoe) presented balance trouble and executive function concerns- [facility] initially   Colon polyps Maternal Grandmother        [place] Aunt, [place] Uncle   Crohn's disease Paternal Aunt    Crohn's disease Cousin    Colon cancer Other        Grandmother's Family (x 1 brother to grandmother and 1 sister to grandmother)   Esophageal cancer Neg Hx    Stomach cancer Neg Hx    Rectal cancer Neg Hx  Objective:  Blood pressure 115/78, pulse 73, height 5' 10 (1.778 m), weight 188 lb 9.6 oz (85.5 kg), SpO2 98%. General: No acute distress.  Patient appears well-groomed.     Juliene Dunnings, DO  CC: Garnette Lukes, MD

## 2024-04-29 ENCOUNTER — Other Ambulatory Visit: Payer: Self-pay

## 2024-04-29 ENCOUNTER — Ambulatory Visit: Payer: 59 | Admitting: Neurology

## 2024-04-29 ENCOUNTER — Encounter: Payer: Self-pay | Admitting: Neurology

## 2024-04-29 VITALS — BP 115/78 | HR 73 | Ht 70.0 in | Wt 188.6 lb

## 2024-04-29 DIAGNOSIS — G43009 Migraine without aura, not intractable, without status migrainosus: Secondary | ICD-10-CM | POA: Diagnosis not present

## 2024-04-29 MED ORDER — SUMATRIPTAN SUCCINATE 100 MG PO TABS
ORAL_TABLET | ORAL | 0 refills | Status: DC
  Filled 2024-04-29: qty 9, fill #0
  Filled 2024-05-17: qty 9, 30d supply, fill #0

## 2024-04-29 MED ORDER — AJOVY 225 MG/1.5ML ~~LOC~~ SOAJ
675.0000 mg | SUBCUTANEOUS | 3 refills | Status: DC
  Filled 2024-04-29: qty 1.5, 30d supply, fill #0

## 2024-04-29 NOTE — Patient Instructions (Signed)
 Change Ajovy  to 3 injections every 3 months Decrease zonisamide  to 1 pill daily for one week, then stop Sumatriptan  as needed.  Limit use of pain relievers to no more than 9 days out of the month to prevent risk of rebound or medication-overuse headache. Keep headache diary Follow up 8 months.

## 2024-05-02 ENCOUNTER — Other Ambulatory Visit: Payer: Self-pay

## 2024-05-02 ENCOUNTER — Encounter: Payer: Self-pay | Admitting: Neurology

## 2024-05-02 ENCOUNTER — Telehealth: Payer: Self-pay | Admitting: Pharmacy Technician

## 2024-05-02 ENCOUNTER — Other Ambulatory Visit (HOSPITAL_COMMUNITY): Payer: Self-pay

## 2024-05-02 NOTE — Telephone Encounter (Signed)
 Pharmacy Patient Advocate Encounter   Received notification from CoverMyMeds that prior authorization for AJOVY  225MG  x3 is required/requested.   Insurance verification completed.   The patient is insured through [employer] .   Per test claim: PA required; PA submitted to above mentioned insurance via CoverMyMeds Key/confirmation #/EOC [id number] Status is pending

## 2024-05-03 ENCOUNTER — Other Ambulatory Visit: Payer: Self-pay

## 2024-05-03 ENCOUNTER — Other Ambulatory Visit: Payer: Self-pay | Admitting: Neurology

## 2024-05-03 ENCOUNTER — Other Ambulatory Visit (HOSPITAL_COMMUNITY): Payer: Self-pay

## 2024-05-03 MED ORDER — NURTEC 75 MG PO TBDP
1.0000 | ORAL_TABLET | Freq: Every day | ORAL | 11 refills | Status: AC | PRN
  Filled 2024-05-03: qty 16, 16d supply, fill #0
  Filled 2024-05-17: qty 16, 16d supply, fill #1
  Filled 2024-05-20: qty 16, 30d supply, fill #1
  Filled 2024-06-18: qty 16, 30d supply, fill #2
  Filled 2024-08-22: qty 16, 30d supply, fill #3
  Filled 2024-09-25: qty 16, 30d supply, fill #4

## 2024-05-03 MED ORDER — AJOVY 225 MG/1.5ML ~~LOC~~ SOAJ
225.0000 mg | SUBCUTANEOUS | 11 refills | Status: AC
  Filled 2024-05-03: qty 1.68, 32d supply, fill #0
  Filled 2024-05-03: qty 1.5, 28d supply, fill #0
  Filled 2024-05-17 – 2024-05-20 (×2): qty 1.5, 28d supply, fill #1
  Filled 2024-06-18: qty 1.5, 28d supply, fill #2
  Filled 2024-07-16: qty 1.5, 28d supply, fill #3
  Filled 2024-08-14: qty 1.5, 28d supply, fill #4
  Filled 2024-09-11: qty 1.5, 28d supply, fill #5
  Filled 2024-10-08: qty 1.5, 28d supply, fill #6
  Filled 2024-11-10: qty 1.5, 28d supply, fill #7

## 2024-05-03 NOTE — Telephone Encounter (Signed)
 The insurance won't pay for a 3 month supply (4.29mL), that's not something that will change with a prior authorization. Patient has already picked up the 1 month supply (1.45mL). If patient is going to be out of the country, he can contact his insurance company and they will sometimes allow for a vacation supply override if he meets the requirements

## 2024-05-03 NOTE — Telephone Encounter (Signed)
 Pharmacy Patient Advocate Encounter  Received notification from [facility] that Prior Authorization for AJOVY  225MG  has been CANCELLED due to   PA #/Case ID/Reference #:  [id number]  [facility]25[facility]15 copay

## 2024-05-06 ENCOUNTER — Other Ambulatory Visit: Payer: Self-pay

## 2024-05-06 MED ORDER — MELOXICAM 15 MG PO TABS
15.0000 mg | ORAL_TABLET | Freq: Every day | ORAL | 0 refills | Status: DC
  Filled 2024-05-06: qty 90, 90d supply, fill #0

## 2024-05-08 ENCOUNTER — Encounter: Payer: Self-pay | Admitting: Family Medicine

## 2024-05-08 ENCOUNTER — Other Ambulatory Visit: Payer: Self-pay

## 2024-05-08 ENCOUNTER — Other Ambulatory Visit (HOSPITAL_COMMUNITY): Payer: Self-pay

## 2024-05-08 MED ORDER — BELSOMRA 15 MG PO TABS
15.0000 mg | ORAL_TABLET | Freq: Every day | ORAL | 0 refills | Status: DC
  Filled 2024-05-08: qty 30, 30d supply, fill #0

## 2024-05-08 NOTE — Telephone Encounter (Signed)
 See below

## 2024-05-11 MED ORDER — AZITHROMYCIN 500 MG PO TABS
ORAL_TABLET | ORAL | 0 refills | Status: DC
  Filled 2024-05-11: qty 3, 3d supply, fill #0

## 2024-05-13 ENCOUNTER — Other Ambulatory Visit: Payer: Self-pay

## 2024-05-14 ENCOUNTER — Other Ambulatory Visit: Payer: Self-pay

## 2024-05-16 ENCOUNTER — Other Ambulatory Visit: Payer: Self-pay

## 2024-05-17 ENCOUNTER — Other Ambulatory Visit: Payer: Self-pay

## 2024-05-20 ENCOUNTER — Other Ambulatory Visit: Payer: Self-pay

## 2024-05-20 MED ORDER — BELSOMRA 15 MG PO TABS
15.0000 mg | ORAL_TABLET | Freq: Every day | ORAL | 1 refills | Status: DC
  Filled 2024-05-20 (×2): qty 30, 30d supply, fill #0
  Filled 2024-07-03 – 2024-07-06 (×2): qty 30, 30d supply, fill #1

## 2024-05-21 ENCOUNTER — Other Ambulatory Visit: Payer: Self-pay

## 2024-05-22 ENCOUNTER — Other Ambulatory Visit: Payer: Self-pay

## 2024-05-23 ENCOUNTER — Other Ambulatory Visit: Payer: Self-pay

## 2024-05-23 ENCOUNTER — Other Ambulatory Visit (HOSPITAL_COMMUNITY): Payer: Self-pay

## 2024-05-24 ENCOUNTER — Other Ambulatory Visit: Payer: Self-pay

## 2024-05-29 ENCOUNTER — Encounter: Payer: 59 | Admitting: Family Medicine

## 2024-06-18 ENCOUNTER — Other Ambulatory Visit: Payer: Self-pay | Admitting: Neurology

## 2024-06-19 ENCOUNTER — Other Ambulatory Visit: Payer: Self-pay

## 2024-06-19 MED ORDER — SUMATRIPTAN SUCCINATE 100 MG PO TABS
ORAL_TABLET | ORAL | 5 refills | Status: DC
  Filled 2024-06-19: qty 9, 5d supply, fill #0
  Filled 2024-07-06: qty 9, 5d supply, fill #1
  Filled 2024-07-25: qty 9, 5d supply, fill #2
  Filled 2024-08-14: qty 9, 5d supply, fill #3
  Filled 2024-08-23 – 2024-09-11 (×2): qty 9, 5d supply, fill #4
  Filled 2024-10-08: qty 9, 5d supply, fill #5

## 2024-06-24 ENCOUNTER — Other Ambulatory Visit: Payer: Self-pay

## 2024-07-02 ENCOUNTER — Encounter: Payer: Self-pay | Admitting: Family Medicine

## 2024-07-04 ENCOUNTER — Other Ambulatory Visit: Payer: Self-pay

## 2024-07-08 ENCOUNTER — Other Ambulatory Visit: Payer: Self-pay

## 2024-07-10 ENCOUNTER — Encounter: Payer: Self-pay | Admitting: Family Medicine

## 2024-07-12 ENCOUNTER — Other Ambulatory Visit: Payer: Self-pay

## 2024-07-12 MED ORDER — BALSALAZIDE DISODIUM 750 MG PO CAPS
2250.0000 mg | ORAL_CAPSULE | Freq: Three times a day (TID) | ORAL | 1 refills | Status: AC
  Filled 2024-07-12 – 2024-09-16 (×5): qty 810, 90d supply, fill #0

## 2024-07-17 ENCOUNTER — Other Ambulatory Visit: Payer: Self-pay

## 2024-07-20 ENCOUNTER — Other Ambulatory Visit: Payer: Self-pay | Admitting: Family Medicine

## 2024-07-22 ENCOUNTER — Other Ambulatory Visit: Payer: Self-pay

## 2024-07-26 ENCOUNTER — Other Ambulatory Visit: Payer: Self-pay

## 2024-07-29 ENCOUNTER — Other Ambulatory Visit: Payer: Self-pay

## 2024-07-29 ENCOUNTER — Ambulatory Visit (INDEPENDENT_AMBULATORY_CARE_PROVIDER_SITE_OTHER): Admitting: Family Medicine

## 2024-07-29 ENCOUNTER — Encounter: Payer: Self-pay | Admitting: Family Medicine

## 2024-07-29 VITALS — BP 128/72 | HR 77 | Temp 97.9°F | Ht 70.0 in | Wt 192.4 lb

## 2024-07-29 DIAGNOSIS — E785 Hyperlipidemia, unspecified: Secondary | ICD-10-CM

## 2024-07-29 DIAGNOSIS — Z125 Encounter for screening for malignant neoplasm of prostate: Secondary | ICD-10-CM

## 2024-07-29 DIAGNOSIS — Z789 Other specified health status: Secondary | ICD-10-CM

## 2024-07-29 DIAGNOSIS — Z23 Encounter for immunization: Secondary | ICD-10-CM | POA: Diagnosis not present

## 2024-07-29 DIAGNOSIS — R739 Hyperglycemia, unspecified: Secondary | ICD-10-CM | POA: Diagnosis not present

## 2024-07-29 DIAGNOSIS — Z131 Encounter for screening for diabetes mellitus: Secondary | ICD-10-CM | POA: Diagnosis not present

## 2024-07-29 DIAGNOSIS — E559 Vitamin D deficiency, unspecified: Secondary | ICD-10-CM

## 2024-07-29 DIAGNOSIS — I10 Essential (primary) hypertension: Secondary | ICD-10-CM

## 2024-07-29 DIAGNOSIS — Z0000 Encounter for general adult medical examination without abnormal findings: Secondary | ICD-10-CM | POA: Diagnosis not present

## 2024-07-29 DIAGNOSIS — R252 Cramp and spasm: Secondary | ICD-10-CM | POA: Diagnosis not present

## 2024-07-29 MED ORDER — BELSOMRA 20 MG PO TABS
20.0000 mg | ORAL_TABLET | Freq: Every evening | ORAL | 1 refills | Status: AC
  Filled 2024-07-29: qty 30, 30d supply, fill #0
  Filled 2024-08-22 – 2024-08-28 (×3): qty 30, 30d supply, fill #1

## 2024-07-29 NOTE — Progress Notes (Signed)
 Phone: [phone]   Subjective:  Patient presents today for their annual physical. Chief complaint-noted.   See problem oriented charting- ROS- full  review of systems was completed and negative  except for topics noted under acute/chronic concerns  The following were reviewed and entered/updated in epic: Past Medical History:  Diagnosis Date   Allergic rhinitis    Allergy    seasonal   Anemia    One early in puberty   Arthritis    Depression    Not currently?   GERD (gastroesophageal reflux disease)    Bertrum syndrome    Heart murmur    tricuspid valve regurgitation   Hyperlipidemia    Hypertension    Internal hemorrhoids    Migraines    Palpitations    Panic attack    occasional   Syncope    on airplane in 12/2016/ due to stress   Ulcer 96?   Duocdenal   Ulcerative proctosigmoiditis (HCC) 2001   Patient Active Problem List   Diagnosis Date Noted   Major depression, recurrent, full remission 08/16/2018    Priority: High   ULCERATIVE COLITIS-LEFT SIDE.  Follows with Dr. Aneita of [facility] 05/06/2008    Priority: High   Insomnia 06/23/2022    Priority: Medium    Raynaud's disease 11/15/2019    Priority: Medium    Essential hypertension 03/12/2019    Priority: Medium    Gilbert syndrome 01/26/2018    Priority: Medium    Hyperglycemia 01/26/2018    Priority: Medium    History of adenomatous polyp of colon 05/22/2017    Priority: Medium    PSORIASIS, SCALP 05/03/2010    Priority: Medium    Hyperlipidemia 05/15/2007    Priority: Medium    Migraines 05/03/2007    Priority: Medium    Vasovagal syncope 02/14/2017    Priority: Low   Hearing loss 08/04/2016    Priority: Low   Low back pain 02/09/2016    Priority: Low   DOE (dyspnea on exertion) 04/15/2014    Priority: Low   Localized osteoarthrosis, lower leg 10/12/2010    Priority: Low   Vitamin D  deficiency 10/27/2009    Priority: Low   GERD 01/31/2008    Priority: Low   INTERNAL HEMORRHOIDS  01/23/2008    Priority: Low   Allergic rhinitis 05/03/2007    Priority: Low   Family history of alpha 1 antitrypsin deficiency 06/14/2018   Past Surgical History:  Procedure Laterality Date   ADENOIDECTOMY     as a child   COLONOSCOPY  2018   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 descending thoracic   GER disease Brother    Colon polyps Brother    Other Brother        #small vessel ischemic disease  in brother- no evidence of stroke. Some memory changes. Thyroid /b12 etc workup.   Hearing loss Brother    Dementia Brother        early onset under 43. amyloid on pet and on med for amyloid- (negative apoe) presented balance trouble and executive function concerns- MCI initially   Colon polyps Maternal Grandmother        [employer], [place] Uncle   Crohn's disease Paternal Aunt    Crohn's disease Cousin    Colon cancer Other        Grandmother's Family (x 1 brother to grandmother and 1 sister to grandmother)   Esophageal cancer Neg Hx    Stomach cancer Neg Hx    Rectal cancer Neg Hx     Medications- reviewed and updated Current Outpatient Medications  Medication Sig Dispense Refill   ALPRAZolam  (XANAX ) 0.5 MG tablet Take 1 tablet (0.5 mg total) by mouth daily as needed FOR EXTREME ANXIETY/PANIC. 30 tablet 0   balsalazide (COLAZAL ) 750 MG capsule Take 3 capsules (2,250 mg total) by mouth 3 (three) times daily. 810 capsule 1   cetirizine (ZYRTEC) 10 MG tablet Take 10 mg by mouth  daily.     chlorthalidone  (HYGROTON ) 25 MG tablet TAKE 1 TABLET DAILY (Patient taking differently: Take 12.5 mg by mouth daily. 1/2 tab) 90 tablet 3   fluticasone  (FLONASE ) 50 MCG/ACT nasal spray USE 2 SPRAYS INTO BOTH     NOSTRIL DAILY 48 g 3   Fremanezumab -vfrm (AJOVY ) 225 MG/1.5ML SOAJ Inject 225 mg into the skin every 28 (twenty-eight) days. 1.5 mL 11   losartan  (COZAAR ) 50 MG tablet TAKE 1 TABLET DAILY 90 tablet 3   metoprolol  succinate (TOPROL -XL) 25 MG 24 hr tablet TAKE 1 TABLET DAILY (Patient taking differently: Take 25 mg by mouth daily. 1/2 tab at bedtime) 90 tablet 3   Rimegepant Sulfate  (NURTEC) 75 MG TBDP Take 1 tablet (75 mg total) by mouth daily as needed. 16 tablet 11   SUMAtriptan  (IMITREX ) 100 MG tablet TAKE 1 TABLET AS NEEDED FOR MIGRAINE. MAY REPEAT AFTER 2 HOURS. MAXIMUM 2 TABLETS IN 24 HOURS 9 tablet 5   Suvorexant  (BELSOMRA ) 15 MG TABS Take 1 tablet (15 mg total) by mouth at bedtime. 30 tablet 1   Testosterone  20.25 MG/ACT (1.62%) GEL Apply 2 Pump topically in the morning apply to clean/intact skin on shoulder, upper arms, or stomach 150 g 1   Vitamin D , Cholecalciferol, 50 MCG (2000 UT) CAPS Take 1 capsule by mouth daily.     l-methylfolate-B6-B12 (METANX) 3-35-2 MG TABS tablet Take 1 tablet by mouth daily.     mesalamine  (CANASA ) 1000 MG suppository Place 1 suppository (1,000 mg total) rectally at bedtime. (Patient not taking: Reported on 07/29/2024) 30 suppository 5   No current facility-administered medications for this visit.    Allergies-reviewed and updated Allergies  Allergen Reactions   Shellfish Allergy Nausea And Vomiting    scallops   Citrus     Oral pain, rash in mouth   Codeine Nausea Only   Salmon [Fish Allergy] Nausea Only and Rash    Especially smoked salmon    Social History   Social History Narrative   Lives with husband Gwendel Gosling also a patient of Dr. Katrinka). No pets or adopted children or natural children.       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 Still writing every morning. Also husband is.       Hobbies: walking, hiking, reading, music, cooking , gardening.  Right handed.    Objective  Objective:  BP 128/72 (BP Location: Left Arm, Patient Position: Sitting, Cuff Size: Normal)   Pulse 77   Temp 97.9 F (36.6 C) (Temporal)   Ht 5' 10 (1.778 m)   Wt 192 lb 6.4 oz (87.3 kg)   SpO2 95%   BMI 27.61 kg/m  Gen: NAD, resting comfortably HEENT: Mucous membranes are moist. Oropharynx normal Neck: no thyromegaly CV: RRR no murmurs rubs or gallops Lungs: CTAB no crackles, wheeze, rhonchi Abdomen: soft/nontender/nondistended/normal bowel sounds. No rebound or guarding.  Ext: no edema Skin: warm, dry Neuro: grossly normal, moves all extremities, PERRLA    Assessment and Plan  63 y.o. male presenting for annual physical.  Health Maintenance counseling: 1. Anticipatory guidance: Patient counseled regarding regular dental exams -q6 months, eye exams -yearly,  avoiding smoking and second hand smoke , limiting alcohol to 2 beverages per day - about 10 a week, no illicit drugs - sparing marijuana last year- hast stopped.   2. Risk factor reduction:  Advised patient of need for regular exercise and diet rich and fruits and vegetables to reduce risk of heart attack and stroke.  Exercise- gym with trainer regularly and walking regularly- trying squats again but not sure if going to tolerate it.  Diet/weight management-within 5 lbs of last year- feels somewhat stuck in this range.  Wt Readings from Last 3 Encounters:  07/29/24 192 lb 6.4 oz (87.3 kg)  04/29/24 188 lb 9.6 oz (85.5 kg)  03/14/24 185 lb 6.4 oz (84.1 kg)  3. Immunizations/screenings/ancillary studies- MMR testing needed unknown status, Prevnar  Immunization History  Administered Date(s) Administered   Hepatitis A, Adult 05/24/2022   Influenza Whole 07/31/2009, 06/16/2010   Influenza, Mdck, Trivalent,PF 6+ MOS(egg free)  08/25/2023   Influenza,inj,Quad PF,6+ Mos 06/30/2015, 05/29/2019, 06/24/2022, 07/02/2024   Influenza-Unspecified 07/21/2016, 07/10/2017, 07/17/2018, 05/29/2019, 07/15/2020, 07/29/2021   Moderna Covid-19 Fall Seasonal Vaccine 26yrs & older 08/25/2023   Moderna SARS-COV2 Booster Vaccination 07/10/2022   Moderna Sars-Covid-2 Vaccination 01/13/2021, 07/02/2024   PFIZER(Purple Top)SARS-COV-2 Vaccination 12/14/2019, 01/04/2020, 07/15/2020   Pfizer Covid-19 Vaccine Bivalent Booster 44yrs & up 07/29/2021   Td 05/09/2006   Tdap 11/01/2013, 11/27/2023   Unspecified SARS-COV-2 Vaccination 07/10/2022   Zoster Recombinant(Shingrix ) 10/15/2018, 03/12/2019  4. Prostate cancer screening- low risk prior trend- update psa today   Lab Results  Component Value Date   PSA 0.38 05/31/2023   PSA 0.40 05/17/2022   PSA 0.49 03/29/2021   5. Colon cancer screening - 08/02/23 with 3 year repeat 6. Skin cancer screening- dermatology yearly still- last visti in august. advised regular sunscreen use. Denies worrisome, changing, or new skin lesions.  7. Smoking associated screening (lung cancer screening, AAA screen 65-75, UA)- never smoker-  8. STD screening - only active with husband  Status of chronic or acute concerns   #illness with travel- took azithromycin - at night some lower abdominal discomfort and is much looser. Nothing he's overly worried about  #Nausea- with certain food and red wine after getting off of antihistamines like benadryl  even with zyrtec at night and more common for him to get rashes- a lot of similarities to his mom  #papules on penis x 2 -  tried hydrocortisone and ketoconazole . Just showed up Tuesday. Seems to be slightly better- one still raised and slightly larger but 1 mm or less. Doubt wart related. Could have been irritated- sees Dr. Lovie in coming months  % Ulcerative colitis-followed with Dr. Aneita on balsalazide- he retired and I  have filled.    #hypertension S: medication:  Chlorthalidone  12.5 mg, losartan  50 mg, metoprolol  25 mg XR (also helps migraines). Some leg cramping- check magnesium A/P: stable- continue current medicines   #hyperlipidemia- Ct calcium scoring of 0 on 07/02/22 S: Medication:none  Lab Results  Component Value Date   CHOL 244 (H) 05/31/2023   HDL 60.20 05/31/2023   LDLCALC 165 (H) 05/31/2023   LDLDIRECT 148.0 10/15/2018   TRIG 92.0 05/31/2023   CHOLHDL 4 05/31/2023   A/P: cholesterol high but reassuring CT calcium- consider repeat 2028   # Depression-working with psychiatrist S:Medication: belsomra , l methyl folate test, as needed xanax  not needing for most part Therapist: couples and individual counseling     07/29/2024    1:26 PM 11/27/2023   10:14 AM 05/26/2023    3:16 PM  Depression screen PHQ 2/9  Decreased Interest 0 0 1  Down, Depressed, Hopeless 0 2 1  PHQ - 2 Score 0 2 2  Altered sleeping 1 0 1  Tired, decreased energy 0 1 1  Change in appetite 0 0 0  Feeling bad or failure about yourself  0 1 0  Trouble concentrating 0 0 0  Moving slowly or fidgety/restless 0 0 0  Suicidal thoughts 0 0 0  PHQ-9 Score 1 4 4   Difficult doing work/chores Not difficult at all Somewhat difficult Somewhat difficult  A/P: full remission and  anxiety doing well also- continue current medications   # Migraines-follows with Dr. Skeet S:Medication:  ajovy  225 mg every 4 weeks, metoprolol  25 mg XR, sumatriptan  100 mg breakthrough or nurtec -some weight gain off of zonisamide  A/P: doing reasonably well- some increase lately- continue to monitor    #Vitamin D  deficiency S: Medication: 2000 units/day      Last vitamin D  Lab Results  Component Value Date   VD25OH 57.00 05/31/2023   A/P: hopefully stable- update vitamin D  today. Continue current meds for now    # Low testosterone -managed by urology Dr. Lovie -also gets pelvic floor therapy  Recommended follow up: Return in about 6 months (around 01/27/2025) for followup or sooner if  needed.Schedule b4 you leave. Future Appointments  Date Time Provider Department Center  12/10/2024 10:50 AM Skeet Cornet R, DO [facility] None   Lab/Order associations:not fasting - 2 eggs 7 30    ICD-10-CM   1. Preventative health care  Z00.00     2. Essential hypertension  I10     3. Hyperlipidemia, unspecified hyperlipidemia type  E78.5 Comprehensive metabolic panel with GFR    CBC with Differential/Platelet    Lipid panel    TSH    4. Vitamin D  deficiency  E55.9 VITAMIN D  25 Hydroxy (Vit-D Deficiency, Fractures)    5. Hyperglycemia  R73.9 Hemoglobin A1c    6. Screening for diabetes mellitus  Z13.1 Hemoglobin A1c    7. Screening for prostate cancer  Z12.5     8. Leg cramping  R25.2 Magnesium    9. Measles, mumps, rubella (MMR) vaccination status unknown  Z78.9 Measles/Mumps/Rubella Immunity      No orders of the defined types were placed in this encounter.   Return precautions advised.  Garnette Lukes, MD

## 2024-07-29 NOTE — Patient Instructions (Addendum)
 Prevnar 20 today  Please stop by lab before you go If you have mychart- we will send your results within 3 business days of us  receiving them.  If you do not have mychart- we will call you about results within 5 business days of us  receiving them.  *please also note that you will see labs on mychart as soon as they post. I will later go in and write notes on them- will say notes from Dr. Katrinka   Recommended follow up: Return in about 6 months (around 01/27/2025) for followup or sooner if needed.Schedule b4 you leave.

## 2024-07-29 NOTE — Addendum Note (Signed)
 Addended by: Cilicia Borden on: 07/29/2024 02:28 PM   Modules accepted: Orders

## 2024-07-30 ENCOUNTER — Ambulatory Visit: Payer: Self-pay | Admitting: Family Medicine

## 2024-07-30 ENCOUNTER — Other Ambulatory Visit: Payer: Self-pay

## 2024-07-30 LAB — HEMOGLOBIN A1C: Hgb A1c MFr Bld: 5.3 % (ref 4.6–6.5)

## 2024-07-30 LAB — COMPREHENSIVE METABOLIC PANEL WITH GFR
ALT: 12 U/L (ref 0–53)
AST: 18 U/L (ref 0–37)
Albumin: 5 g/dL (ref 3.5–5.2)
Alkaline Phosphatase: 30 U/L — ABNORMAL LOW (ref 39–117)
BUN: 17 mg/dL (ref 6–23)
CO2: 26 meq/L (ref 19–32)
Calcium: 9.6 mg/dL (ref 8.4–10.5)
Chloride: 101 meq/L (ref 96–112)
Creatinine, Ser: 1.2 mg/dL (ref 0.40–1.50)
GFR: 65.15 mL/min (ref >60.00)
Glucose, Bld: 84 mg/dL (ref 70–99)
Potassium: 3.3 meq/L — ABNORMAL LOW (ref 3.5–5.1)
Sodium: 141 meq/L (ref 135–145)
Total Bilirubin: 1.5 mg/dL — ABNORMAL HIGH (ref 0.2–1.2)
Total Protein: 7.3 g/dL (ref 6.0–8.3)

## 2024-07-30 LAB — LIPID PANEL
Cholesterol: 251 mg/dL — ABNORMAL HIGH (ref 0–200)
HDL: 72.1 mg/dL (ref >39.00)
LDL Cholesterol: 158 mg/dL — ABNORMAL HIGH (ref 0–99)
NonHDL: 179.17
Total CHOL/HDL Ratio: 3
Triglycerides: 107 mg/dL (ref 0.0–149.0)
VLDL: 21.4 mg/dL (ref 0.0–40.0)

## 2024-07-30 LAB — CBC WITH DIFFERENTIAL/PLATELET
Basophils Absolute: 0.1 K/uL (ref 0.0–0.1)
Basophils Relative: 0.8 % (ref 0.0–3.0)
Eosinophils Absolute: 0.1 K/uL (ref 0.0–0.7)
Eosinophils Relative: 1.9 % (ref 0.0–5.0)
HCT: 45.1 % (ref 39.0–52.0)
Hemoglobin: 15.3 g/dL (ref 13.0–17.0)
Lymphocytes Relative: 23.5 % (ref 12.0–46.0)
Lymphs Abs: 1.5 K/uL (ref 0.7–4.0)
MCHC: 34 g/dL (ref 30.0–36.0)
MCV: 88.3 fl (ref 78.0–100.0)
Monocytes Absolute: 0.5 K/uL (ref 0.1–1.0)
Monocytes Relative: 8.3 % (ref 3.0–12.0)
Neutro Abs: 4.1 K/uL (ref 1.4–7.7)
Neutrophils Relative %: 65.5 % (ref 43.0–77.0)
Platelets: 187 K/uL (ref 150.0–400.0)
RBC: 5.1 Mil/uL (ref 4.22–5.81)
RDW: 13.1 % (ref 11.5–15.5)
WBC: 6.3 K/uL (ref 4.0–10.5)

## 2024-07-30 LAB — TSH: TSH: 0.82 u[IU]/mL (ref 0.35–5.50)

## 2024-07-30 LAB — MAGNESIUM: Magnesium: 2 mg/dL (ref 1.5–2.5)

## 2024-07-30 LAB — VITAMIN D 25 HYDROXY (VIT D DEFICIENCY, FRACTURES): VITD: 54.55 ng/mL (ref 30.00–100.00)

## 2024-07-31 ENCOUNTER — Other Ambulatory Visit: Payer: Self-pay | Admitting: Family Medicine

## 2024-07-31 LAB — MEASLES/MUMPS/RUBELLA IMMUNITY
Mumps IgG: 10.9 [AU]/ml — ABNORMAL LOW
Rubella: 5.51 {index}
Rubeola IgG: 231 [AU]/ml

## 2024-07-31 MED ORDER — POTASSIUM CHLORIDE ER 10 MEQ PO CPCR: ORAL_CAPSULE | ORAL | 1 refills | Status: DC

## 2024-08-02 ENCOUNTER — Telehealth: Payer: Self-pay

## 2024-08-02 NOTE — Telephone Encounter (Signed)
 Copied from CRM [id number]. Topic: Appointments - Scheduling Inquiry for Clinic >> Aug 02, 2024 10:00 AM Harlene ORN wrote: Reason for CRM: Patient called, requesting an MMR vaccine. Please call to schedule.  Please return pt call and schedule for Nurse visit for MMR booster per Dr Katrinka MyChart message to pt

## 2024-08-04 ENCOUNTER — Other Ambulatory Visit: Payer: Self-pay | Admitting: Family Medicine

## 2024-08-06 ENCOUNTER — Ambulatory Visit (INDEPENDENT_AMBULATORY_CARE_PROVIDER_SITE_OTHER)

## 2024-08-06 DIAGNOSIS — Z23 Encounter for immunization: Secondary | ICD-10-CM

## 2024-08-06 NOTE — Progress Notes (Signed)
 Patient is in office today for a nurse visit for Immunization. Patient Injection was given in the  Right arm. Patient tolerated injection well.

## 2024-08-07 ENCOUNTER — Other Ambulatory Visit: Payer: Self-pay | Admitting: Family Medicine

## 2024-08-14 ENCOUNTER — Other Ambulatory Visit: Payer: Self-pay

## 2024-08-22 ENCOUNTER — Other Ambulatory Visit: Payer: Self-pay

## 2024-08-23 ENCOUNTER — Encounter: Payer: Self-pay | Admitting: Family Medicine

## 2024-08-23 ENCOUNTER — Other Ambulatory Visit: Payer: Self-pay

## 2024-08-24 ENCOUNTER — Other Ambulatory Visit: Payer: Self-pay

## 2024-08-24 ENCOUNTER — Ambulatory Visit
Admission: RE | Admit: 2024-08-24 | Discharge: 2024-08-24 | Disposition: A | Discharge status: Home / Self Care | Attending: Physician Assistant | Admitting: Physician Assistant

## 2024-08-24 VITALS — BP 119/82 | HR 77 | Temp 98.0°F | Resp 18 | Ht 70.0 in | Wt 185.0 lb

## 2024-08-24 DIAGNOSIS — Z202 Contact with and (suspected) exposure to infections with a predominantly sexual mode of transmission: Secondary | ICD-10-CM | POA: Diagnosis not present

## 2024-08-24 DIAGNOSIS — R0982 Postnasal drip: Secondary | ICD-10-CM | POA: Diagnosis not present

## 2024-08-24 DIAGNOSIS — J029 Acute pharyngitis, unspecified: Secondary | ICD-10-CM | POA: Diagnosis not present

## 2024-08-24 LAB — POCT RAPID STREP A (OFFICE): Rapid Strep A Screen: NEGATIVE

## 2024-08-24 MED ORDER — DOXYCYCLINE HYCLATE 100 MG PO CAPS: 100.0000 mg | ORAL_CAPSULE | Freq: Two times a day (BID) | ORAL | 0 refills | Status: AC

## 2024-08-24 NOTE — ED Provider Notes (Signed)
 [facility]    CSN: [id number] Arrival date & time: 08/24/24  1016      History   Chief Complaint Chief Complaint  Patient presents with   Sore Throat    Persistent sore throat for two weeks without fever. Separate issue: may need doxycycline -pep. - Entered by patient    HPI [name] is a 63 y.o. male.  has a past medical history of Allergic rhinitis, Allergy, Anemia, Arthritis, Depression, GERD (gastroesophageal reflux disease), Bertrum syndrome, Heart murmur, Hyperlipidemia, Hypertension, Internal hemorrhoids, Migraines, Palpitations, Panic attack, Syncope, Ulcer (96?), and Ulcerative proctosigmoiditis (HCC) (2001).   HPI  Discussed the use of AI scribe software for clinical note transcription with the patient, who gave verbal consent to proceed.  The patient presents with a persistent sore throat and requests doxycycline  for a low-risk exposure.  He has experienced a persistent sore throat for about two weeks, initially losing his voice and experiencing significant pain, which he describes as the second most painful sore throat he has ever had. The pain disrupts his sleep, particularly when lying flat. About a week into the illness, he began experiencing coughing fits at night lasting about half an hour. He has been using lozenges, Advil, and throat sprays for relief, but the symptoms have persisted. He notes soreness in the throat and soft palate. He has a history of being highly susceptible to sinus infections, but this episode has not manifested in the typical way. He reports chills and difficulty regulating temperature, but no fever over 98.73F. He has experienced lower abdominal pain and occasional nausea, particularly when coughing. He has been using nasal irrigation and reports dry mucus but no significant nasal congestion. He continues to use Flonase  and Zyrtec daily for allergies.  He also requests doxycycline  following a low-risk sexual exposure. He  describes the exposure as brief and without ejaculation. He has never had any sexually transmitted infections in the past and is concerned about the potential risk of chlamydia and gonorrhea.   Past Medical History:  Diagnosis Date   Allergic rhinitis    Allergy    seasonal   Anemia    One early in puberty   Arthritis    Depression    Not currently?   GERD (gastroesophageal reflux disease)    Bertrum syndrome    Heart murmur    tricuspid valve regurgitation   Hyperlipidemia    Hypertension    Internal hemorrhoids    Migraines    Palpitations    Panic attack    occasional   Syncope    on airplane in 12/2016/ due to stress   Ulcer 96?   Duocdenal   Ulcerative proctosigmoiditis (HCC) 2001    Patient Active Problem List   Diagnosis Date Noted   Insomnia 06/23/2022   Raynaud's disease 11/15/2019   Essential hypertension 03/12/2019   Major depression, recurrent, full remission 08/16/2018   Family history of alpha 1 antitrypsin deficiency 06/14/2018   Bertrum syndrome 01/26/2018   Hyperglycemia 01/26/2018   History of adenomatous polyp of colon 05/22/2017   Vasovagal syncope 02/14/2017   Hearing loss 08/04/2016   Low back pain 02/09/2016   DOE (dyspnea on exertion) 04/15/2014   Localized osteoarthrosis, lower leg 10/12/2010   PSORIASIS, SCALP 05/03/2010   Vitamin D  deficiency 10/27/2009   ULCERATIVE COLITIS-LEFT SIDE.  Follows with Dr. 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 Authorizing Provider   doxycycline  (VIBRAMYCIN ) 100 MG capsule Take 1 capsule (100 mg total) by mouth 2 (two) times daily for 7 days. Take 200 mg as a single dose on day 1 then one tablet every 12 hours for the remainder of the course. 08/24/24 08/31/24 Yes Brit Wernette E, PA-C  ALPRAZolam  (XANAX ) 0.5 MG tablet Take 1 tablet (0.5 mg total) by mouth daily as needed FOR EXTREME ANXIETY/PANIC. 03/21/24     balsalazide (COLAZAL ) 750 MG capsule Take 3 capsules (2,250 mg total) by mouth 3 (three) times daily. 07/12/24   Katrinka Garnette KIDD, MD  cetirizine (ZYRTEC) 10 MG tablet Take 10 mg by mouth daily.    [provider]  chlorthalidone  (HYGROTON ) 25 MG tablet TAKE 1 TABLET DAILY Patient taking differently: Take 12.5 mg by mouth daily. 1/2 tab 02/13/23   Katrinka Garnette KIDD, MD  fluticasone  (FLONASE ) 50 MCG/ACT nasal spray USE 2 SPRAYS INTO BOTH     NOSTRIL DAILY 07/22/24   Katrinka Garnette KIDD, MD  Fremanezumab -vfrm (AJOVY ) 225 MG/1.5ML SOAJ Inject 225 mg into the skin every 28 (twenty-eight) days. 05/03/24   Skeet, Adam R, DO  l-methylfolate-B6-B12 (METANX) 3-35-2 MG TABS tablet Take 1 tablet by mouth daily.    [provider]  losartan  (COZAAR ) 50 MG tablet TAKE 1 TABLET DAILY 02/13/24   Katrinka Garnette KIDD, MD  mesalamine  (CANASA ) 1000 MG suppository Place 1 suppository (1,000 mg total) rectally at bedtime. Patient not taking: Reported on 07/29/2024 12/09/22   Katrinka Garnette KIDD, MD  metoprolol  succinate (TOPROL -XL) 25 MG 24 hr tablet TAKE 1 TABLET DAILY 08/05/24   Katrinka Garnette KIDD, MD  potassium chloride (MICRO-K) 10 MEQ CR capsule TAKE AS INSTRUCTED BY DR. HUNTER- WHEN RECEIVE THIS TAKE TWICE DAILY FOR 5 DAYS. 08/07/24   Katrinka Garnette KIDD, MD  Rimegepant Sulfate  (NURTEC) 75 MG TBDP Take 1 tablet (75 mg total) by mouth daily as needed. 05/03/24   Skeet, Adam R, DO  SUMAtriptan  (IMITREX ) 100 MG tablet TAKE 1 TABLET AS NEEDED FOR MIGRAINE. MAY REPEAT AFTER 2 HOURS. MAXIMUM 2 TABLETS IN 24 HOURS 06/19/24   Skeet, Adam R, DO   Suvorexant  (BELSOMRA ) 15 MG TABS Take 1 tablet (15 mg total) by mouth at bedtime. 05/20/24     Suvorexant  (BELSOMRA ) 20 MG TABS Take 1 tablet (20 mg total) by mouth at bedtime. 07/29/24     Testosterone  20.25 MG/ACT (1.62%) GEL Apply 2 Pump topically in the morning apply to clean/intact skin on shoulder, upper arms, or stomach 04/05/24     Vitamin D , Cholecalciferol, 50 MCG (2000 UT) CAPS Take 1 capsule by mouth daily.    [provider]  glycopyrrolate (ROBINUL) 2 MG tablet Take 2 mg by mouth 2 (two) times daily.    05/20/11  [provider]    Family History Family History  Problem Relation Age of Onset   Arthritis Mother        reports apoe gene and bother todd has   Cancer Mother    Thyroid  disease Father    Heart  disease Father        a fib, expanded aorta   Hearing loss Father    Heart disease Brother        marfanoid related- connective tissue related   Varicose Veins Brother    Aortic aneurysm Brother        tear requiring intensive care in 2024. descending thoracic   GER disease Brother    Colon polyps Brother    Other Brother        #small vessel ischemic disease in brother- no evidence of stroke. Some memory changes. Thyroid /b12 etc workup.   Hearing loss Brother    Dementia Brother        early onset under 77. amyloid on pet and on med for amyloid- (negative apoe) presented balance trouble and executive function concerns- MCI initially   Colon polyps Maternal Grandmother        [employer], Great Uncle   Crohn's disease Paternal Aunt    Crohn's disease Cousin    Colon cancer Other        Grandmother's Family (x 1 brother to grandmother and 1 sister to grandmother)   Esophageal cancer Neg Hx    Stomach cancer Neg Hx    Rectal cancer Neg Hx     Social History Social History   Tobacco Use   Smoking status: Never   Smokeless tobacco: Never  Vaping Use   Vaping status: Former  Substance Use Topics   Alcohol use: Yes    Alcohol/week: 10.0  standard drinks of alcohol    Types: 10 Glasses of wine per week    Comment: 2 a day/14 a week   Drug use: Not Currently    Types: Amyl nitrate, Marijuana     Allergies   Shellfish allergy, Citrus, Codeine, and Salmon [fish allergy]   Review of Systems Review of Systems  Constitutional:  Positive for chills. Negative for fatigue and fever.  HENT:  Positive for sore throat and voice change (resolved). Negative for congestion, ear pain, postnasal drip, rhinorrhea, sinus pressure and sinus pain.        Soreness along the jawline   Tickling sensation in right ear    Respiratory:  Positive for cough. Negative for shortness of breath and wheezing.   Gastrointestinal:  Positive for abdominal pain (lower abdominal pain) and nausea (with coughing spells). Negative for diarrhea and vomiting.  Genitourinary:  Negative for dysuria, penile discharge, penile pain, penile swelling, scrotal swelling and testicular pain.  Musculoskeletal:  Negative for myalgias.  Skin:  Negative for rash.  Hematological:  Positive for adenopathy.     Physical Exam Triage Vital Signs ED Triage Vitals  Encounter Vitals Group     BP 08/24/24 1117 119/82     Girls Systolic BP Percentile --      Girls Diastolic BP Percentile --      Boys Systolic BP Percentile --      Boys Diastolic BP Percentile --      Pulse Rate 08/24/24 1117 77     Resp 08/24/24 1117 18     Temp 08/24/24 1117 98 F (36.7 C)     Temp Source 08/24/24 1117 Oral     SpO2 08/24/24 1117 96 %     Weight 08/24/24 1120 185 lb (83.9 kg)     Height 08/24/24 1120 5' 10 (1.778 m)     Head Circumference --      Peak Flow --      Pain Score 08/24/24 1119 2  Pain Loc --      Pain Education --      Exclude from Growth Chart --    No data found.  Updated Vital Signs BP 119/82 (BP Location: Right Arm)   Pulse 77   Temp 98 F (36.7 C) (Oral)   Resp 18   Ht 5' 10 (1.778 m)   Wt 185 lb (83.9 kg)   SpO2 96%   BMI 26.54 kg/m   Visual  Acuity Right Eye Distance:   Left Eye Distance:   Bilateral Distance:    Right Eye Near:   Left Eye Near:    Bilateral Near:     Physical Exam Vitals reviewed.  Constitutional:      General: He is awake.     Appearance: Normal appearance. He is well-developed and well-groomed.  HENT:     Head: Normocephalic and atraumatic.     Right Ear: Hearing, tympanic membrane and ear canal normal.     Left Ear: Hearing, tympanic membrane and ear canal normal.     Mouth/Throat:     Lips: Pink.     Mouth: Mucous membranes are moist.     Pharynx: Oropharynx is clear. Uvula midline. Posterior oropharyngeal erythema and postnasal drip present. No pharyngeal swelling, oropharyngeal exudate or uvula swelling.     Tonsils: No tonsillar exudate or tonsillar abscesses. 0 on the right. 0 on the left.  Eyes:     General: Lids are normal. Gaze aligned appropriately.     Extraocular Movements: Extraocular movements intact.  Cardiovascular:     Rate and Rhythm: Normal rate and regular rhythm.     Heart sounds: Normal heart sounds.  Pulmonary:     Effort: Pulmonary effort is normal.     Breath sounds: Normal breath sounds. No decreased air movement. No decreased breath sounds, wheezing, rhonchi or rales.  Musculoskeletal:     Cervical back: Normal range of motion and neck supple.  Lymphadenopathy:     Head:     Right side of head: No submental, submandibular or preauricular adenopathy.     Left side of head: No submental, submandibular or preauricular adenopathy.     Cervical:     Right cervical: No superficial cervical adenopathy.    Left cervical: No superficial cervical adenopathy.     Upper Body:     Right upper body: No supraclavicular adenopathy.     Left upper body: No supraclavicular adenopathy.  Skin:    General: Skin is warm and dry.  Neurological:     General: No focal deficit present.     Mental Status: He is alert and oriented to person, place, and time.  Psychiatric:        Mood  and Affect: Mood normal.        Behavior: Behavior normal. Behavior is cooperative.        Thought Content: Thought content normal.        Judgment: Judgment normal.      UC Treatments / Results  Labs (all labs ordered are listed, but only abnormal results are displayed) Labs Reviewed  POCT RAPID STREP A (OFFICE)    EKG   Radiology No results found.  Procedures Procedures (including critical care time)  Medications Ordered in UC Medications - No data to display  Initial Impression / Assessment and Plan / UC Course  I have reviewed the triage vital signs and the nursing notes.  Pertinent labs & imaging results that were available during my care of the patient  were reviewed by me and considered in my medical decision making (see chart for details).      Final Clinical Impressions(s) / UC Diagnoses   Final diagnoses:  Sore throat  Postnasal drip  Possible exposure to STD   Acute pharyngitis Persistent sore throat for two weeks with significant pain, loss of voice, and coughing fits. Negative rapid strep test. Differential includes viral infection and potential bacterial infection due to prolonged symptoms. No fever or significant swelling. Possible allergy-mediated etiology considered. - Sent throat culture for definitive rule out of bacterial infection - Prescribed doxycycline  100 mg twice daily for 7 days for potential bacterial infection  Sexually transmitted infection exposure requiring post-exposure prophylaxis Low-risk exposure to potential STIs. Doxycycline  chosen for PEP due to its efficacy against chlamydia and potential benefit against gonorrhea. Discussed testing for gonorrhea and other STIs within a week of exposure. - Prescribed doxycycline  200 mg as initial dose, followed by 100 mg twice daily for 7 days - Recommended STI testing within a week of exposure, focusing on gonorrhea and chlamydia and trichomonas   Allergic rhinitis Chronic allergic rhinitis  managed with Zyrtec. Consideration of switching to Claritin  or Allegra if symptoms persist. - Consider switching from Zyrtec to Claritin  or Allegra if symptoms persist    Discharge Instructions      VISIT SUMMARY:  You came in today with a persistent sore throat that has lasted for about two weeks, along with a request for doxycycline  following a low-risk exposure. You have been experiencing significant pain, loss of voice, and nighttime coughing fits. You have also been managing chronic allergies with Zyrtec.  YOUR PLAN:  -ACUTE PHARYNGITIS: Acute pharyngitis is an inflammation of the throat that can cause pain, coughing, and difficulty swallowing. Your rapid strep test was negative, but a throat culture has been sent to rule out a bacterial infection. You have been prescribed doxycycline  100 mg twice daily for 7 days to address any potential bacterial cause.  -SEXUALLY TRANSMITTED INFECTION EXPOSURE REQUIRING POST-EXPOSURE PROPHYLAXIS: You had a low-risk exposure to potential sexually transmitted infections (STIs). Doxycycline  has been prescribed as a precautionary measure to prevent chlamydia and possibly gonorrhea. You should take an initial dose of 200 mg, followed by 100 mg twice daily for 7 days. It is recommended that you get tested for STIs, particularly gonorrhea and chlamydia, within a week of the exposure.  -ALLERGIC RHINITIS: Allergic rhinitis is an allergic reaction that causes sneezing, congestion, and a runny nose. You are currently managing this with Zyrtec. If your symptoms persist, consider switching to Claritin  or Allegra.  INSTRUCTIONS:  Please follow up for STI testing within a week of your exposure. Continue taking doxycycline  as prescribed and complete the full course. If your sore throat does not improve or worsens, please return for further evaluation.     ED Prescriptions     Medication Sig Dispense Auth. Provider   doxycycline  (VIBRAMYCIN ) 100 MG capsule Take  1 capsule (100 mg total) by mouth 2 (two) times daily for 7 days. Take 200 mg as a single dose on day 1 then one tablet every 12 hours for the remainder of the course. 15 capsule Aveion Nguyen E, PA-C      PDMP not reviewed this encounter.   Marylene Rocky BRAVO, PA-C 08/24/24 1314

## 2024-08-24 NOTE — ED Triage Notes (Signed)
 Pt presents with a chief complaint of sore throat and dry cough x 2 weeks. Took two at-home COVID tests this week, both were negative. Currently rates overall throat pain a 2/10. OTC Advil and Benadryl  taken with improvement. In addition to throat lozenges. Does endorse headaches + night sweats but this is normal for him.   Also reporting he had oral sex yesterday. Would like a round of doxycyline. States he knows it is to early to test.

## 2024-08-24 NOTE — Discharge Instructions (Addendum)
 VISIT SUMMARY:  You came in today with a persistent sore throat that has lasted for about two weeks, along with a request for doxycycline  following a low-risk exposure. You have been experiencing significant pain, loss of voice, and nighttime coughing fits. You have also been managing chronic allergies with Zyrtec.  YOUR PLAN:  -ACUTE PHARYNGITIS: Acute pharyngitis is an inflammation of the throat that can cause pain, coughing, and difficulty swallowing. Your rapid strep test was negative, but a throat culture has been sent to rule out a bacterial infection. You have been prescribed doxycycline  100 mg twice daily for 7 days to address any potential bacterial cause.  -SEXUALLY TRANSMITTED INFECTION EXPOSURE REQUIRING POST-EXPOSURE PROPHYLAXIS: You had a low-risk exposure to potential sexually transmitted infections (STIs). Doxycycline  has been prescribed as a precautionary measure to prevent chlamydia and possibly gonorrhea. You should take an initial dose of 200 mg, followed by 100 mg twice daily for 7 days. It is recommended that you get tested for STIs, particularly gonorrhea and chlamydia, within a week of the exposure.  -ALLERGIC RHINITIS: Allergic rhinitis is an allergic reaction that causes sneezing, congestion, and a runny nose. You are currently managing this with Zyrtec. If your symptoms persist, consider switching to Claritin  or Allegra.  INSTRUCTIONS:  Please follow up for STI testing within a week of your exposure. Continue taking doxycycline  as prescribed and complete the full course. If your sore throat does not improve or worsens, please return for further evaluation.

## 2024-08-26 ENCOUNTER — Ambulatory Visit: Admitting: Family Medicine

## 2024-08-26 ENCOUNTER — Other Ambulatory Visit: Payer: Self-pay

## 2024-08-27 ENCOUNTER — Other Ambulatory Visit: Payer: Self-pay

## 2024-08-27 ENCOUNTER — Encounter: Payer: Self-pay | Admitting: Family Medicine

## 2024-08-27 MED ORDER — TESTOSTERONE 20.25 MG/ACT (1.62%) TD GEL
2.0000 | Freq: Every morning | TRANSDERMAL | 5 refills | Status: AC
  Filled 2024-08-27: qty 75, 30d supply, fill #0
  Filled 2024-09-25: qty 75, 30d supply, fill #1
  Filled 2024-11-15: qty 75, 30d supply, fill #2

## 2024-08-28 ENCOUNTER — Other Ambulatory Visit: Payer: Self-pay | Admitting: Family Medicine

## 2024-08-28 ENCOUNTER — Other Ambulatory Visit: Payer: Self-pay

## 2024-08-28 DIAGNOSIS — Z113 Encounter for screening for infections with a predominantly sexual mode of transmission: Secondary | ICD-10-CM

## 2024-08-28 DIAGNOSIS — Z118 Encounter for screening for other infectious and parasitic diseases: Secondary | ICD-10-CM

## 2024-08-30 ENCOUNTER — Encounter: Payer: Self-pay | Admitting: Family Medicine

## 2024-08-30 ENCOUNTER — Ambulatory Visit: Admitting: Family Medicine

## 2024-08-30 ENCOUNTER — Other Ambulatory Visit: Payer: Self-pay | Admitting: Family Medicine

## 2024-08-30 VITALS — BP 128/82 | HR 51 | Temp 98.1°F | Ht 70.0 in | Wt 193.4 lb

## 2024-08-30 DIAGNOSIS — J309 Allergic rhinitis, unspecified: Secondary | ICD-10-CM

## 2024-08-30 DIAGNOSIS — Z113 Encounter for screening for infections with a predominantly sexual mode of transmission: Secondary | ICD-10-CM | POA: Diagnosis not present

## 2024-08-30 DIAGNOSIS — Z118 Encounter for screening for other infectious and parasitic diseases: Secondary | ICD-10-CM | POA: Diagnosis not present

## 2024-08-30 DIAGNOSIS — I10 Essential (primary) hypertension: Secondary | ICD-10-CM | POA: Diagnosis not present

## 2024-08-30 NOTE — Progress Notes (Signed)
 Phone [phone] In person visit   Subjective:   [name] is a 62 y.o. year old very pleasant male patient who presents for/with See problem oriented charting Chief Complaint  Patient presents with   STD Screening    Urine cytology and throat swab;     Past Medical History-  Patient Active Problem List   Diagnosis Date Noted   Major depression, recurrent, full remission 08/16/2018    Priority: High   ULCERATIVE COLITIS-LEFT SIDE.  Follows with Dr. Aneita of [facility] 05/06/2008    Priority: High   Insomnia 06/23/2022    Priority: Medium    Raynaud's disease 11/15/2019    Priority: Medium    Essential hypertension 03/12/2019    Priority: Medium    Gilbert syndrome 01/26/2018    Priority: Medium    Hyperglycemia 01/26/2018    Priority: Medium    History of adenomatous polyp of colon 05/22/2017    Priority: Medium    PSORIASIS, SCALP 05/03/2010    Priority: Medium    Hyperlipidemia 05/15/2007    Priority: Medium    Migraines 05/03/2007    Priority: Medium    Vasovagal syncope 02/14/2017    Priority: Low   Hearing loss 08/04/2016    Priority: Low   Low back pain 02/09/2016    Priority: Low   DOE (dyspnea on exertion) 04/15/2014    Priority: Low   Localized osteoarthrosis, lower leg 10/12/2010    Priority: Low   Vitamin D  deficiency 10/27/2009    Priority: Low   GERD 01/31/2008    Priority: Low   INTERNAL HEMORRHOIDS 01/23/2008    Priority: Low   Allergic rhinitis 05/03/2007    Priority: Low   Family history of alpha 1 antitrypsin deficiency 06/14/2018    Medications- reviewed and updated Current Outpatient Medications  Medication Sig Dispense Refill   ALPRAZolam  (XANAX ) 0.5 MG tablet Take 1 tablet (0.5 mg total) by mouth daily as needed FOR EXTREME ANXIETY/PANIC. 30 tablet 0   balsalazide (COLAZAL ) 750 MG capsule Take 3 capsules (2,250 mg total) by mouth 3 (three) times daily. 810 capsule 1   cetirizine (ZYRTEC) 10 MG tablet Take 10 mg by mouth  daily.     chlorthalidone  (HYGROTON ) 25 MG tablet TAKE 1 TABLET DAILY (Patient taking differently: Take 12.5 mg by mouth daily. 1/2 tab) 90 tablet 3   doxycycline  (VIBRAMYCIN ) 100 MG capsule Take 1 capsule (100 mg total) by mouth 2 (two) times daily for 7 days. Take 200 mg as a single dose on day 1 then one tablet every 12 hours for the remainder of the course. 15 capsule 0   fluticasone  (FLONASE ) 50 MCG/ACT nasal spray USE 2 SPRAYS INTO BOTH     NOSTRIL DAILY 48 g 3   Fremanezumab -vfrm (AJOVY ) 225 MG/1.5ML SOAJ Inject 225 mg into the skin every 28 (twenty-eight) days. 1.5 mL 11   losartan  (COZAAR ) 50 MG tablet TAKE 1 TABLET DAILY 90 tablet 3   metoprolol  succinate (TOPROL -XL) 25 MG 24 hr tablet TAKE 1 TABLET DAILY 90 tablet 3   Rimegepant Sulfate  (NURTEC) 75 MG TBDP Take 1 tablet (75 mg total) by mouth daily as needed. 16 tablet 11   SUMAtriptan  (IMITREX ) 100 MG tablet TAKE 1 TABLET AS NEEDED FOR MIGRAINE. MAY REPEAT AFTER 2 HOURS. MAXIMUM 2 TABLETS IN 24 HOURS 9 tablet 5   Suvorexant  (BELSOMRA ) 20 MG TABS Take 1 tablet (20 mg total) by mouth at bedtime. 30 tablet 1   Testosterone  (ANDROGEL  PUMP) 20.25 MG/ACT (1.62%)  GEL Apply 2 Pump topically in the morning as instructed. 75 g 5   Vitamin D , Cholecalciferol, 50 MCG (2000 UT) CAPS Take 1 capsule by mouth daily.     mesalamine  (CANASA ) 1000 MG suppository Place 1 suppository (1,000 mg total) rectally at bedtime. (Patient not taking: Reported on 08/30/2024) 30 suppository 5   No current facility-administered medications for this visit.     Objective:  BP 128/82 (BP Location: Left Arm, Patient Position: Sitting, Cuff Size: Normal)   Pulse (!) 51   Temp 98.1 F (36.7 C) (Temporal)   Ht 5' 10 (1.778 m)   Wt 193 lb 6.4 oz (87.7 kg)   SpO2 97%   BMI 27.75 kg/m  Gen: NAD, resting comfortably Nasal turbinates edematous with clear drainage.  No maxillary or frontal sinus tenderness.  Pharynx with mild signs of drainage-no tonsillar swelling or  exudate CV: Bradycardic but regular no murmurs rubs or gallops Lungs: CTAB no crackles, wheeze, rhonchi Ext: no edema Skin: warm, dry    Assessment and Plan   # Possible STD exposure # Allergic rhinitis S: Patient with low risk exposure prior to 08/24/2024 urgent care visit.  He was given doxycycline  postexposure prophylaxis-described encounter as brief and without ejaculation but did include oral risks of brief.  They recommended gonorrhea and Chlamydia testing 7 days after the encounter  Before that visit he also had significant sore throat for about 2 weeks and was placed on doxycycline  for a week.  Thankfully pain has improved and his throat and his left ear pressure has improved.  His strep test was negative- no send out. Ear is still better. Sinuses and throat still somewhat bothersome -allegra at night- just started back, zyrtec in the morning (may trial xyzal instead). He may switch is regimen- ongoing post nasal drip . Still on flonase  A/P: Possible STD exposure oral and penile-urine cytology today 7 days after encounter.  Also oral swab was completed.  He suspects HIV risk would be low but did agree to antibody testing at next visit-would be too soon for that today  Patient completing doxycycline  course-this should cover sinusitis and his sinuses are nontender.  Strep test was negative and throat appears to mainly be drainage related/postnasal drip-Poor Control of Allergic Rhinitis He Is Going to Substitute Xyzal for Zyrtec and May Add Astelin 2 Weeks Later If Not Further Improving-Could Also Consider Allergist Referral or See Him Back to Reevaluate   #hypertension S: medication: Chlorthalidone  12.5 mg, losartan  50 mg, metoprolol  25 mg XR (also helps migraines) BP Readings from Last 3 Encounters:  08/30/24 128/82  08/24/24 119/82  07/29/24 128/72  A/P: Well-controlled continue current medication  Recommended follow up: Return for as needed for new, worsening, persistent  symptoms. Future Appointments  Date Time Provider Department Center  12/10/2024 10:50 AM Skeet Juliene SAUNDERS, DO [facility] None  01/28/2025  9:20 AM Katrinka Garnette KIDD, MD [facility]    Lab/Order associations:   ICD-10-CM   1. Essential hypertension  I10     2. Allergic rhinitis, unspecified seasonality, unspecified trigger  J30.9     3. Screening for gonorrhea  Z11.3 GC/Chlamydia Probe Amp    CANCELED: GC/Chlamydia Probe Amp    4. Screening for chlamydial disease  Z11.8 GC/Chlamydia Probe Amp    CANCELED: GC/Chlamydia Probe Amp      No orders of the defined types were placed in this encounter.   Return precautions advised.  Garnette Katrinka, MD

## 2024-08-30 NOTE — Patient Instructions (Addendum)
 Could try xyzal or generic version as alternate. Can add astelin if needed if no better in 2 weeks.   Please stop by lab before you go- to drop off urine If you have mychart- we will send your results within 3 business days of us  receiving them.  If you do not have mychart- we will call you about results within 5 business days of us  receiving them.  *please also note that you will see labs on mychart as soon as they post. I will later go in and write notes on them- will say notes from Dr. Katrinka   Recommended follow up: Return for as needed for new, worsening, persistent symptoms.

## 2024-09-02 ENCOUNTER — Other Ambulatory Visit: Payer: Self-pay

## 2024-09-02 ENCOUNTER — Other Ambulatory Visit (HOSPITAL_COMMUNITY)
Admission: RE | Admit: 2024-09-02 | Discharge: 2024-09-02 | Disposition: A | Discharge status: Home / Self Care | Source: Ambulatory Visit | Attending: Family Medicine | Admitting: Family Medicine

## 2024-09-02 DIAGNOSIS — Z118 Encounter for screening for other infectious and parasitic diseases: Secondary | ICD-10-CM | POA: Diagnosis present

## 2024-09-02 DIAGNOSIS — Z113 Encounter for screening for infections with a predominantly sexual mode of transmission: Secondary | ICD-10-CM | POA: Insufficient documentation

## 2024-09-03 ENCOUNTER — Ambulatory Visit: Payer: Self-pay | Admitting: Family Medicine

## 2024-09-03 LAB — URINE CYTOLOGY ANCILLARY ONLY
Chlamydia: NEGATIVE
Comment: NEGATIVE
Comment: NEGATIVE
Comment: NORMAL
Neisseria Gonorrhea: NEGATIVE
Trichomonas: NEGATIVE

## 2024-09-03 LAB — GC/CHLAMYDIA PROBE AMP
Chlamydia trachomatis, NAA: NEGATIVE
Neisseria Gonorrhoeae by PCR: NEGATIVE

## 2024-09-03 LAB — SPECIMEN STATUS REPORT

## 2024-09-11 ENCOUNTER — Other Ambulatory Visit: Payer: Self-pay

## 2024-09-13 ENCOUNTER — Other Ambulatory Visit: Payer: Self-pay

## 2024-09-16 ENCOUNTER — Other Ambulatory Visit: Payer: Self-pay

## 2024-09-16 MED ORDER — BELSOMRA 20 MG PO TABS
20.0000 mg | ORAL_TABLET | Freq: Every day | ORAL | 1 refills | Status: AC
  Filled 2024-09-26: qty 30, 30d supply, fill #0
  Filled 2024-10-08: qty 30, 30d supply, fill #1
  Filled ????-??-??: fill #0

## 2024-09-20 ENCOUNTER — Ambulatory Visit: Admitting: Podiatry

## 2024-09-20 DIAGNOSIS — M21961 Unspecified acquired deformity of right lower leg: Secondary | ICD-10-CM

## 2024-09-20 DIAGNOSIS — M21962 Unspecified acquired deformity of left lower leg: Secondary | ICD-10-CM | POA: Diagnosis not present

## 2024-09-20 DIAGNOSIS — M7751 Other enthesopathy of right foot: Secondary | ICD-10-CM | POA: Diagnosis not present

## 2024-09-20 NOTE — Progress Notes (Signed)
 Subjective:  Patient ID: [name], male    DOB: 08-May-1961,  MRN: [id number]  Chief Complaint  Patient presents with   Toe Pain    63 y.o. male presents with the above complaint.  Patient presents with right second metatarsophalangeal joint pain hurts with ambulation and shoe pressure he states it just came out of nowhere.  He wanted get it evaluated he has a bunion deformity as well.  He does not wear any orthotics he is known to Dr. Verta who seen him in the past for neuroma treatment.  Pain scale is 5 out of 10 dull aching nature.   Review of Systems: Negative except as noted in the HPI. Denies N/V/F/Ch.  Past Medical History:  Diagnosis Date   Allergic rhinitis    Allergy    seasonal   Anemia    One early in puberty   Arthritis    Depression    Not currently?   GERD (gastroesophageal reflux disease)    Bertrum syndrome    Heart murmur    tricuspid valve regurgitation   Hyperlipidemia    Hypertension    Internal hemorrhoids    Migraines    Palpitations    Panic attack    occasional   Syncope    on airplane in 12/2016/ due to stress   Ulcer 96?   Duocdenal   Ulcerative proctosigmoiditis (HCC) 2001   Current Medications[1]  Tobacco Use History[2]  Allergies[3] Objective:  There were no vitals filed for this visit. There is no height or weight on file to calculate BMI. Constitutional Well developed. Well nourished.  Vascular Dorsalis pedis pulses palpable bilaterally. Posterior tibial pulses palpable bilaterally. Capillary refill normal to all digits.  No cyanosis or clubbing noted. Pedal hair growth normal.  Neurologic Normal speech. Oriented to person, place, and time. Epicritic sensation to light touch grossly present bilaterally.  Dermatologic Nails well groomed and normal in appearance. No open wounds. No skin lesions.  Orthopedic: Right second metatarsophalangeal joint pain pain with range of motion of the joint no deep intra-articular pain  noted.  Negative extensor flexor tendinitis noted.  Moderate bunion deformity noted.  It is a track bound not a tracking deformity.  Pes planovalgus foot deformity noted   Radiographs: None Assessment:   1. Capsulitis of metatarsophalangeal (MTP) joint of right foot   2. Deformity of both feet    Plan:  Patient was evaluated and treated and all questions answered.  Right second metatarsophalangeal joint capsulitis - All questions and concerns were discussed with the patient in extensive detail given the amount of pain that he is experiencing benefit from steroid injection to decrease complaints of show pain.  Patient agrees with plan to proceed with steroid injection -A steroid injection was performed at right second using 1% plain Lidocaine and 10 mg of Kenalog . This was well tolerated.  Pes planovalgus/foot deformity -I explained to patient the etiology of pes planovalgus and relationship with heel pain/arch pain and various treatment options were discussed.  Given patient foot structure in the setting of heel pain/arch pain I believe patient will benefit from custom-made orthotics to help control the hindfoot motion support the arch of the foot and take the stress away from arches.  Patient agrees with the plan like to proceed with orthotics -Patient was casted for orthotics   No follow-ups on file.     [1]  Current Outpatient Medications:    ALPRAZolam  (XANAX ) 0.5 MG tablet, Take 1 tablet (0.5 mg total) by mouth  daily as needed FOR EXTREME ANXIETY/PANIC., Disp: 30 tablet, Rfl: 0   balsalazide (COLAZAL ) 750 MG capsule, Take 3 capsules (2,250 mg total) by mouth 3 (three) times daily., Disp: 810 capsule, Rfl: 1   cetirizine (ZYRTEC) 10 MG tablet, Take 10 mg by mouth daily., Disp: , Rfl:    chlorthalidone  (HYGROTON ) 25 MG tablet, TAKE 1 TABLET DAILY (Patient taking differently: Take 12.5 mg by mouth daily. 1/2 tab), Disp: 90 tablet, Rfl: 3   fluticasone  (FLONASE ) 50 MCG/ACT nasal spray,  USE 2 SPRAYS INTO BOTH     NOSTRIL DAILY, Disp: 48 g, Rfl: 3   Fremanezumab -vfrm (AJOVY ) 225 MG/1.5ML SOAJ, Inject 225 mg into the skin every 28 (twenty-eight) days., Disp: 1.5 mL, Rfl: 11   losartan  (COZAAR ) 50 MG tablet, TAKE 1 TABLET DAILY, Disp: 90 tablet, Rfl: 3   mesalamine  (CANASA ) 1000 MG suppository, Place 1 suppository (1,000 mg total) rectally at bedtime. (Patient not taking: Reported on 08/30/2024), Disp: 30 suppository, Rfl: 5   metoprolol  succinate (TOPROL -XL) 25 MG 24 hr tablet, TAKE 1 TABLET DAILY, Disp: 90 tablet, Rfl: 3   Rimegepant Sulfate  (NURTEC) 75 MG TBDP, Take 1 tablet (75 mg total) by mouth daily as needed., Disp: 16 tablet, Rfl: 11   SUMAtriptan  (IMITREX ) 100 MG tablet, TAKE 1 TABLET AS NEEDED FOR MIGRAINE. MAY REPEAT AFTER 2 HOURS. MAXIMUM 2 TABLETS IN 24 HOURS, Disp: 9 tablet, Rfl: 5   Suvorexant  (BELSOMRA ) 20 MG TABS, Take 1 tablet (20 mg total) by mouth at bedtime., Disp: 30 tablet, Rfl: 1   [START ON 09/26/2024] Suvorexant  (BELSOMRA ) 20 MG TABS, Take 1 tablet (20 mg total) by mouth at bedtime., Disp: 30 tablet, Rfl: 1   Testosterone  (ANDROGEL  PUMP) 20.25 MG/ACT (1.62%) GEL, Apply 2 Pump topically in the morning as instructed., Disp: 75 g, Rfl: 5   Vitamin D , Cholecalciferol, 50 MCG (2000 UT) CAPS, Take 1 capsule by mouth daily., Disp: , Rfl:  [2]  Social History Tobacco Use  Smoking Status Never  Smokeless Tobacco Never  [3]  Allergies Allergen Reactions   Shellfish Allergy Nausea And Vomiting    scallops   Citrus     Oral pain, rash in mouth   Codeine Nausea Only   Salmon [Fish Allergy] Nausea Only and Rash    Especially smoked salmon

## 2024-09-23 ENCOUNTER — Ambulatory Visit: Admitting: Podiatry

## 2024-09-25 ENCOUNTER — Other Ambulatory Visit: Payer: Self-pay

## 2024-09-26 ENCOUNTER — Other Ambulatory Visit: Payer: Self-pay

## 2024-09-26 MED ORDER — AZITHROMYCIN 500 MG PO TABS
ORAL_TABLET | ORAL | 0 refills | Status: AC
  Filled 2024-09-26: qty 3, fill #0

## 2024-09-27 ENCOUNTER — Other Ambulatory Visit: Payer: Self-pay

## 2024-10-08 ENCOUNTER — Encounter (HOSPITAL_COMMUNITY): Payer: Self-pay

## 2024-10-08 ENCOUNTER — Other Ambulatory Visit: Payer: Self-pay

## 2024-10-09 ENCOUNTER — Other Ambulatory Visit: Payer: Self-pay

## 2024-10-14 ENCOUNTER — Other Ambulatory Visit (HOSPITAL_COMMUNITY): Payer: Self-pay

## 2024-10-14 ENCOUNTER — Other Ambulatory Visit: Payer: Self-pay

## 2024-10-25 ENCOUNTER — Telehealth: Payer: Self-pay | Admitting: Podiatry

## 2024-10-25 NOTE — Telephone Encounter (Signed)
 Orthotics are in [place] called LM on VM for patient to call to schedule for PUO

## 2024-11-10 ENCOUNTER — Other Ambulatory Visit: Payer: Self-pay

## 2024-11-10 ENCOUNTER — Other Ambulatory Visit: Payer: Self-pay | Admitting: Neurology

## 2024-11-14 ENCOUNTER — Other Ambulatory Visit: Payer: Self-pay

## 2024-11-14 MED ORDER — SUMATRIPTAN SUCCINATE 100 MG PO TABS
ORAL_TABLET | ORAL | 0 refills | Status: AC
  Filled 2024-11-14: qty 9, 18d supply, fill #0

## 2024-11-15 ENCOUNTER — Other Ambulatory Visit: Payer: Self-pay | Admitting: Family Medicine

## 2024-11-15 ENCOUNTER — Other Ambulatory Visit: Payer: Self-pay

## 2024-11-20 ENCOUNTER — Ambulatory Visit: Admitting: Podiatry

## 2024-12-10 ENCOUNTER — Ambulatory Visit: Admitting: Neurology

## 2025-01-28 ENCOUNTER — Ambulatory Visit: Admitting: Family Medicine
